# Patient Record
Sex: Male | Born: 1955 | Race: Black or African American | Hispanic: No | Marital: Single | Smoking: Never smoker
Health system: Southern US, Community
[De-identification: ages and names within clinical notes are randomized; demographics above are authoritative.]

## PROBLEM LIST (undated history)

## (undated) DIAGNOSIS — N2581 Secondary hyperparathyroidism of renal origin: Secondary | ICD-10-CM

## (undated) DIAGNOSIS — I10 Essential (primary) hypertension: Secondary | ICD-10-CM

## (undated) DIAGNOSIS — K219 Gastro-esophageal reflux disease without esophagitis: Secondary | ICD-10-CM

## (undated) DIAGNOSIS — N186 End stage renal disease: Secondary | ICD-10-CM

## (undated) DIAGNOSIS — E785 Hyperlipidemia, unspecified: Secondary | ICD-10-CM

## (undated) DIAGNOSIS — E119 Type 2 diabetes mellitus without complications: Secondary | ICD-10-CM

## (undated) DIAGNOSIS — N289 Disorder of kidney and ureter, unspecified: Secondary | ICD-10-CM

---

## 2003-12-27 ENCOUNTER — Other Ambulatory Visit: Payer: Self-pay

## 2004-09-28 ENCOUNTER — Emergency Department: Payer: Self-pay | Admitting: Emergency Medicine

## 2004-10-05 ENCOUNTER — Emergency Department: Payer: Self-pay | Admitting: Emergency Medicine

## 2004-10-06 ENCOUNTER — Other Ambulatory Visit: Payer: Self-pay

## 2005-07-10 ENCOUNTER — Emergency Department: Payer: Self-pay | Admitting: Emergency Medicine

## 2007-12-19 ENCOUNTER — Ambulatory Visit: Payer: Self-pay | Admitting: Nephrology

## 2007-12-23 ENCOUNTER — Ambulatory Visit: Payer: Self-pay | Admitting: Nephrology

## 2007-12-31 ENCOUNTER — Ambulatory Visit: Payer: Self-pay | Admitting: Internal Medicine

## 2008-01-26 ENCOUNTER — Ambulatory Visit: Payer: Self-pay | Admitting: Nephrology

## 2008-02-25 ENCOUNTER — Ambulatory Visit: Payer: Self-pay | Admitting: Nephrology

## 2008-04-02 ENCOUNTER — Ambulatory Visit: Payer: Self-pay | Admitting: Nephrology

## 2008-11-28 ENCOUNTER — Emergency Department: Payer: Self-pay | Admitting: Emergency Medicine

## 2008-12-08 ENCOUNTER — Ambulatory Visit: Payer: Self-pay | Admitting: Nephrology

## 2008-12-10 ENCOUNTER — Ambulatory Visit: Payer: Self-pay | Admitting: Vascular Surgery

## 2009-01-10 ENCOUNTER — Ambulatory Visit: Payer: Self-pay | Admitting: Internal Medicine

## 2009-01-12 ENCOUNTER — Ambulatory Visit: Payer: Self-pay | Admitting: Internal Medicine

## 2009-01-14 ENCOUNTER — Ambulatory Visit: Payer: Self-pay | Admitting: Internal Medicine

## 2009-06-03 ENCOUNTER — Ambulatory Visit: Payer: Self-pay | Admitting: Vascular Surgery

## 2009-11-03 ENCOUNTER — Ambulatory Visit: Payer: Self-pay | Admitting: Vascular Surgery

## 2009-12-02 ENCOUNTER — Ambulatory Visit: Payer: Self-pay | Admitting: Vascular Surgery

## 2009-12-08 ENCOUNTER — Inpatient Hospital Stay: Payer: Self-pay | Admitting: Internal Medicine

## 2010-06-27 ENCOUNTER — Ambulatory Visit: Payer: Self-pay | Admitting: Vascular Surgery

## 2010-11-10 ENCOUNTER — Ambulatory Visit: Payer: Self-pay | Admitting: Vascular Surgery

## 2010-11-15 ENCOUNTER — Ambulatory Visit: Payer: Self-pay | Admitting: Vascular Surgery

## 2010-12-11 ENCOUNTER — Ambulatory Visit: Payer: Self-pay | Admitting: Vascular Surgery

## 2010-12-21 ENCOUNTER — Ambulatory Visit: Payer: Self-pay | Admitting: Vascular Surgery

## 2010-12-28 ENCOUNTER — Ambulatory Visit: Payer: Self-pay | Admitting: Vascular Surgery

## 2011-01-03 ENCOUNTER — Ambulatory Visit: Payer: Self-pay | Admitting: Vascular Surgery

## 2011-04-03 ENCOUNTER — Ambulatory Visit: Payer: Self-pay | Admitting: Internal Medicine

## 2011-06-09 ENCOUNTER — Inpatient Hospital Stay: Payer: Self-pay | Admitting: Internal Medicine

## 2011-06-19 ENCOUNTER — Ambulatory Visit: Payer: Self-pay | Admitting: Vascular Surgery

## 2011-07-02 ENCOUNTER — Emergency Department: Payer: Self-pay | Admitting: Emergency Medicine

## 2011-07-03 ENCOUNTER — Ambulatory Visit: Payer: Self-pay | Admitting: Vascular Surgery

## 2011-11-01 ENCOUNTER — Ambulatory Visit: Payer: Self-pay | Admitting: Vascular Surgery

## 2012-01-22 ENCOUNTER — Ambulatory Visit: Payer: Self-pay | Admitting: Vascular Surgery

## 2012-04-01 ENCOUNTER — Emergency Department: Payer: Self-pay | Admitting: Emergency Medicine

## 2012-05-23 ENCOUNTER — Ambulatory Visit: Payer: Self-pay | Admitting: Vascular Surgery

## 2012-06-16 ENCOUNTER — Emergency Department: Payer: Self-pay | Admitting: Emergency Medicine

## 2012-07-04 ENCOUNTER — Inpatient Hospital Stay: Payer: Self-pay | Admitting: Specialist

## 2012-07-04 LAB — CBC
HCT: 28.1 % — ABNORMAL LOW (ref 40.0–52.0)
HGB: 9.3 g/dL — ABNORMAL LOW (ref 13.0–18.0)
MCH: 30.8 pg (ref 26.0–34.0)
MCHC: 33.2 g/dL (ref 32.0–36.0)
Platelet: 252 10*3/uL (ref 150–440)

## 2012-07-04 LAB — COMPREHENSIVE METABOLIC PANEL
Albumin: 2.9 g/dL — ABNORMAL LOW (ref 3.4–5.0)
Anion Gap: 12 (ref 7–16)
Bilirubin,Total: 0.4 mg/dL (ref 0.2–1.0)
Calcium, Total: 9.5 mg/dL (ref 8.5–10.1)
Co2: 26 mmol/L (ref 21–32)
Creatinine: 9.28 mg/dL — ABNORMAL HIGH (ref 0.60–1.30)
EGFR (African American): 7 — ABNORMAL LOW
EGFR (Non-African Amer.): 6 — ABNORMAL LOW
Glucose: 151 mg/dL — ABNORMAL HIGH (ref 65–99)
SGOT(AST): 23 U/L (ref 15–37)
SGPT (ALT): 11 U/L — ABNORMAL LOW
Sodium: 137 mmol/L (ref 136–145)
Total Protein: 8 g/dL (ref 6.4–8.2)

## 2012-07-04 LAB — PHOSPHORUS: Phosphorus: 4.7 mg/dL (ref 2.5–4.9)

## 2012-07-04 LAB — APTT: Activated PTT: 44.5 secs — ABNORMAL HIGH (ref 23.6–35.9)

## 2012-07-04 LAB — TROPONIN I: Troponin-I: <0.02 ng/mL

## 2012-07-04 LAB — MAGNESIUM: Magnesium: 1.9 mg/dL

## 2012-07-05 LAB — CBC WITH DIFFERENTIAL/PLATELET
Basophil #: 0.1 10*3/uL (ref 0.0–0.1)
Eosinophil #: 0.1 10*3/uL (ref 0.0–0.7)
Eosinophil %: 1.2 %
HCT: 29.4 % — ABNORMAL LOW (ref 40.0–52.0)
Lymphocyte %: 14.9 %
MCV: 94 fL (ref 80–100)
Monocyte #: 1.1 x10 3/mm — ABNORMAL HIGH (ref 0.2–1.0)
Monocyte %: 9.1 %
Neutrophil %: 73.9 %
RBC: 3.15 10*6/uL — ABNORMAL LOW (ref 4.40–5.90)
RDW: 15.3 % — ABNORMAL HIGH (ref 11.5–14.5)
WBC: 12 10*3/uL — ABNORMAL HIGH (ref 3.8–10.6)

## 2012-07-05 LAB — BASIC METABOLIC PANEL
BUN: 40 mg/dL — ABNORMAL HIGH (ref 7–18)
Co2: 28 mmol/L (ref 21–32)
Creatinine: 11.66 mg/dL — ABNORMAL HIGH (ref 0.60–1.30)
EGFR (African American): 5 — ABNORMAL LOW
EGFR (Non-African Amer.): 4 — ABNORMAL LOW
Glucose: 129 mg/dL — ABNORMAL HIGH (ref 65–99)
Sodium: 137 mmol/L (ref 136–145)

## 2012-07-05 LAB — PHOSPHORUS: Phosphorus: 4.5 mg/dL (ref 2.5–4.9)

## 2012-07-05 LAB — CK TOTAL AND CKMB (NOT AT ARMC): CK, Total: 43 U/L (ref 35–232)

## 2012-11-07 IMAGING — XA IR VASCULAR PROCEDURE
10 of 11 series · 15 of 16 positions shown · non-contrast
Comparison: none

[Series 1: care upper arm · 2 of 2 slices shown (1 of 7)]
[im 1/2]
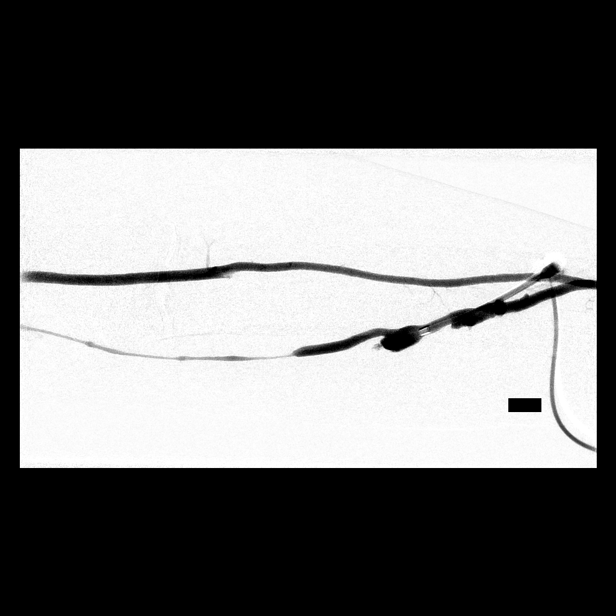
[im 2/2]
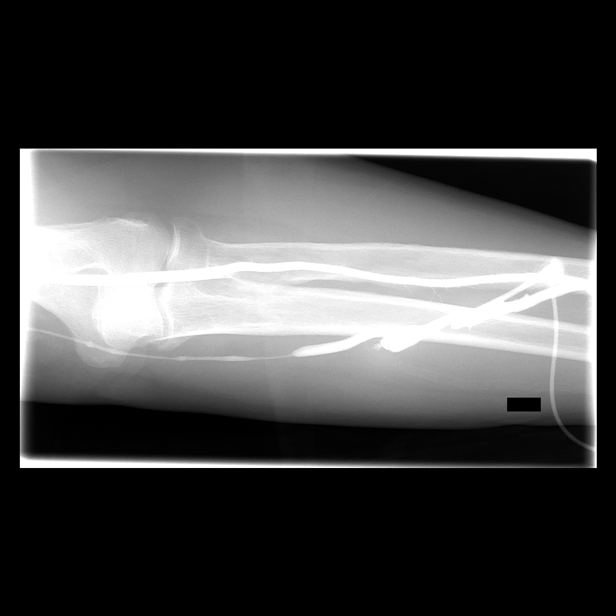

[Series 2: care upper arm · 2 of 2 slices shown (2 of 7)]
[im 1/2]
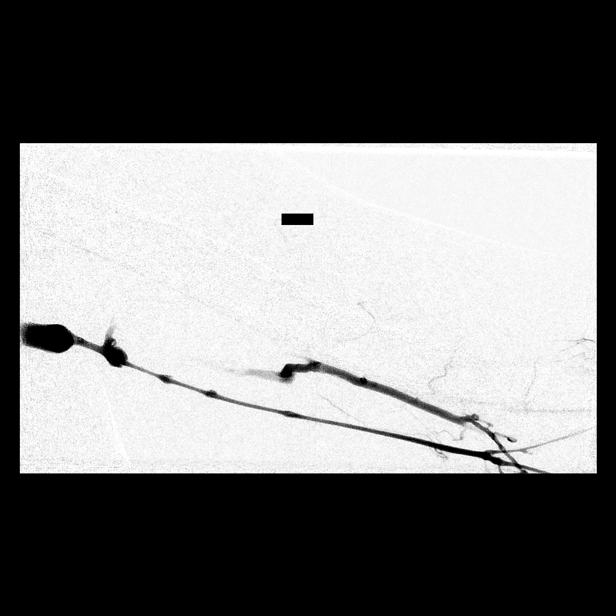
[im 2/2]
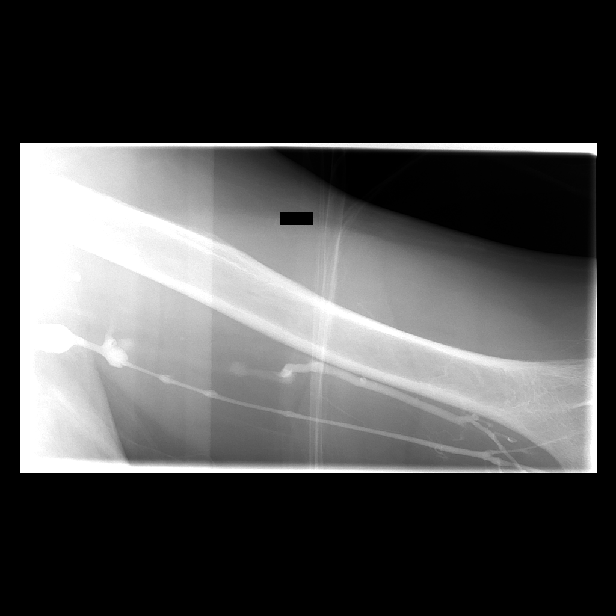

[Series 3: fl - angio · 1 of 1 slices shown (1 of 3)]
[im 1/1]
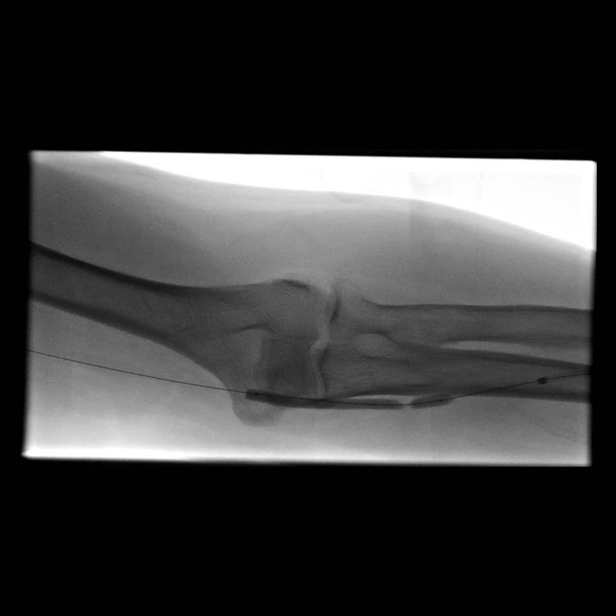

[Series 4: fl - angio · 1 of 1 slices shown (2 of 3)]
[im 1/1]
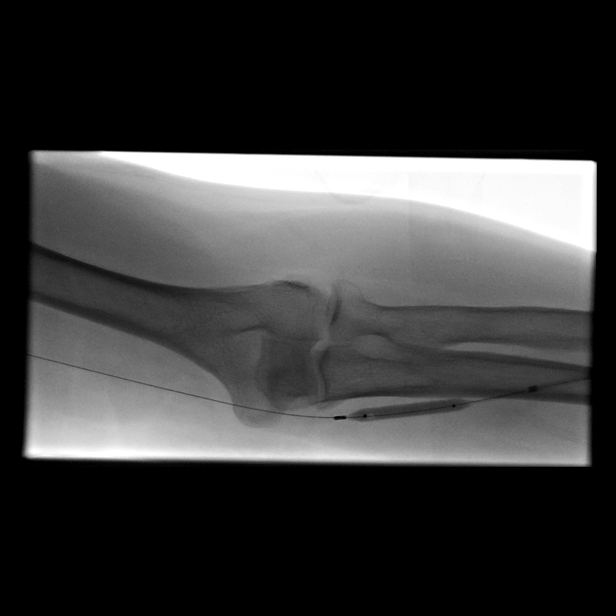

[Series 5: care upper arm · 1 of 1 slices shown (3 of 7)]
[im 1/1]
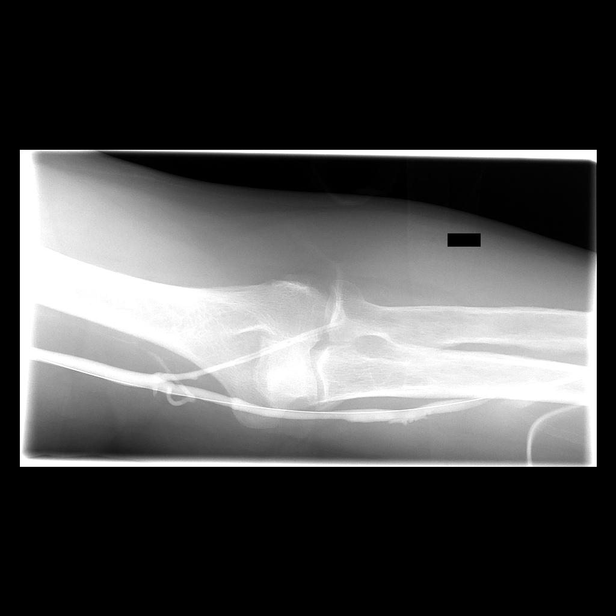

[Series 7: care upper arm · 1 of 1 slices shown (4 of 7)]
[im 1/1]
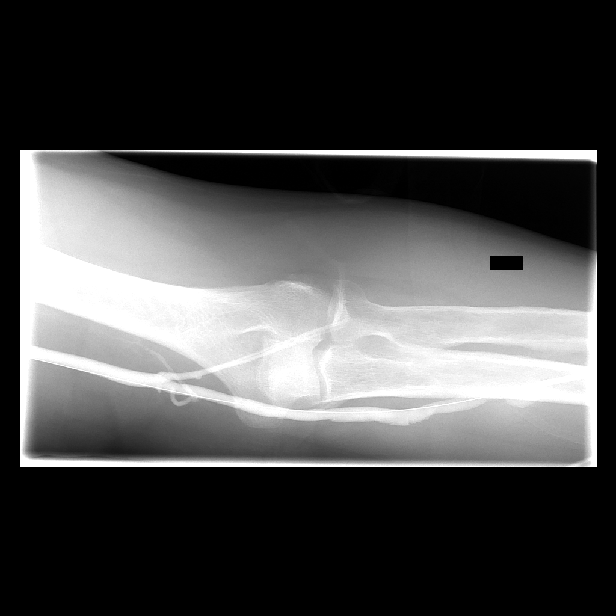

[Series 8: fl - angio · 2 of 2 slices shown (3 of 3)]
[im 1/2]
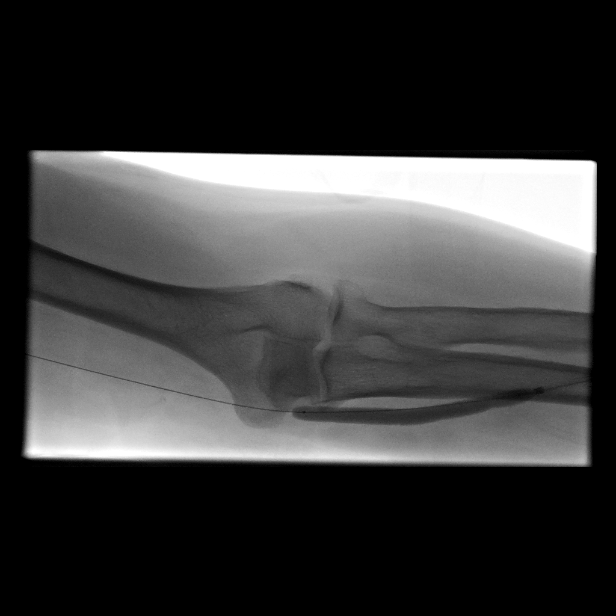
[im 2/2]
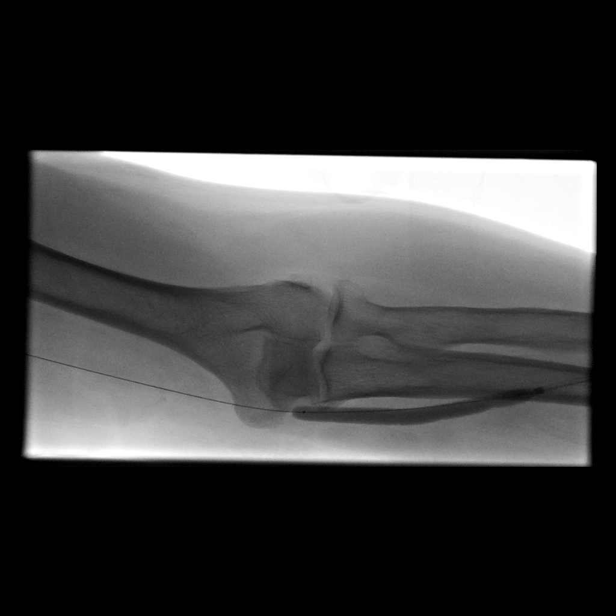

[Series 9: care upper arm · 1 of 1 slices shown (5 of 7)]
[im 1/1]
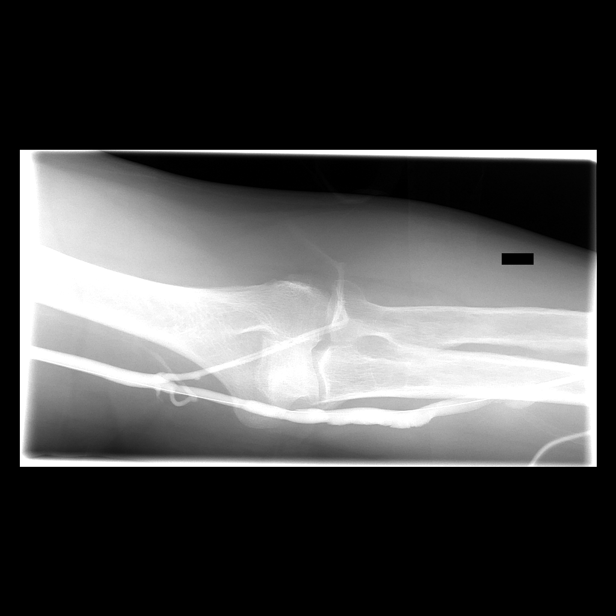

[Series 10: care upper arm · 2 of 2 slices shown (6 of 7)]
[im 1/2]
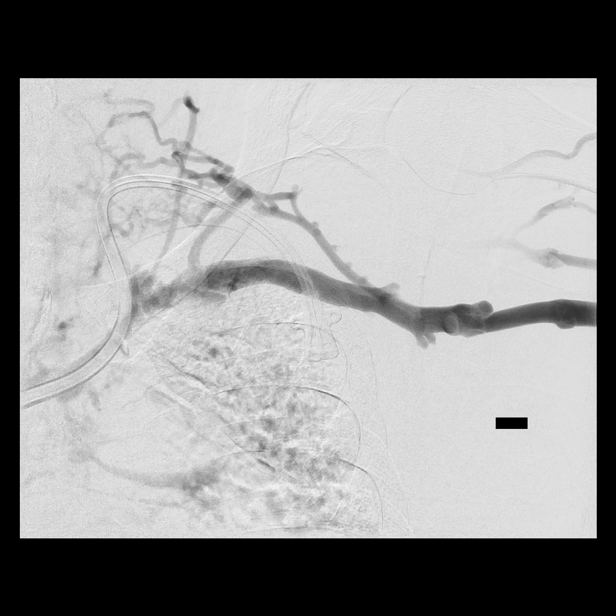
[im 2/2]
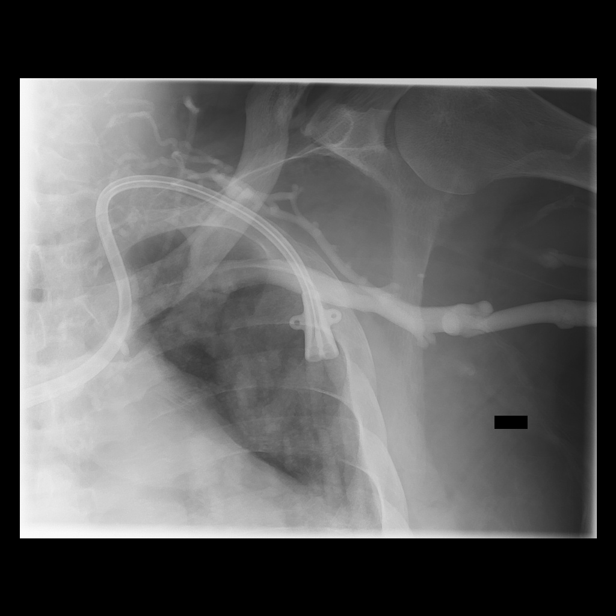

[Series 11: care upper arm · 2 of 2 slices shown (7 of 7)]
[im 1/2]
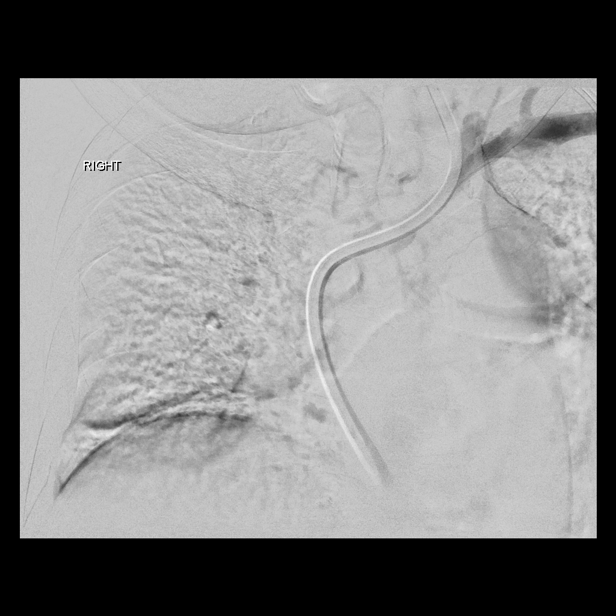
[im 2/2]
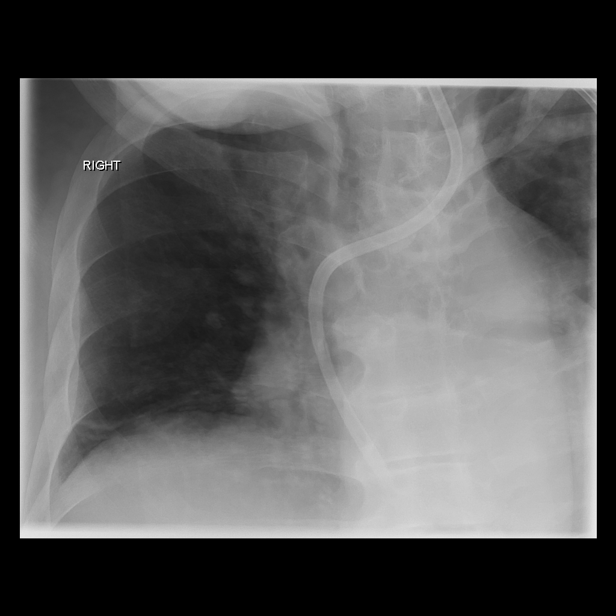

[15 of 16 positions shown; findings below may reference images not displayed]

IMAGES IMPORTED FROM THE [employer] WORKFLOW SYSTEM
NO DICTATION FOR STUDY

## 2013-04-23 ENCOUNTER — Ambulatory Visit: Payer: Self-pay | Admitting: Vascular Surgery

## 2013-04-23 LAB — BASIC METABOLIC PANEL
Anion Gap: 6 — ABNORMAL LOW (ref 7–16)
Calcium, Total: 10.3 mg/dL — ABNORMAL HIGH (ref 8.5–10.1)
Chloride: 102 mmol/L (ref 98–107)
Creatinine: 6.52 mg/dL — ABNORMAL HIGH (ref 0.60–1.30)
EGFR (African American): 10 — ABNORMAL LOW
EGFR (Non-African Amer.): 9 — ABNORMAL LOW
Glucose: 125 mg/dL — ABNORMAL HIGH (ref 65–99)
Potassium: 3.9 mmol/L (ref 3.5–5.1)

## 2013-10-29 ENCOUNTER — Ambulatory Visit: Payer: Self-pay | Admitting: Vascular Surgery

## 2013-10-29 LAB — PROTIME-INR
INR: 3.3
Prothrombin Time: 32.3 secs — ABNORMAL HIGH (ref 11.5–14.7)

## 2013-11-27 ENCOUNTER — Other Ambulatory Visit: Payer: Self-pay | Admitting: Internal Medicine

## 2013-11-27 LAB — PROTIME-INR
INR: 7.4
Prothrombin Time: 59.2 secs — ABNORMAL HIGH (ref 11.5–14.7)

## 2013-11-30 ENCOUNTER — Other Ambulatory Visit: Payer: Self-pay | Admitting: Internal Medicine

## 2013-11-30 LAB — PROTIME-INR
INR: 5.7
Prothrombin Time: 48.5 secs — ABNORMAL HIGH (ref 11.5–14.7)

## 2013-12-02 ENCOUNTER — Other Ambulatory Visit: Payer: Self-pay | Admitting: Internal Medicine

## 2014-04-09 ENCOUNTER — Ambulatory Visit: Payer: Self-pay

## 2014-04-23 ENCOUNTER — Ambulatory Visit: Payer: Self-pay

## 2014-07-23 ENCOUNTER — Other Ambulatory Visit: Payer: Self-pay | Admitting: Internal Medicine

## 2015-01-11 ENCOUNTER — Encounter: Payer: Self-pay | Admitting: Surgery

## 2015-01-24 ENCOUNTER — Encounter: Payer: Self-pay | Admitting: Surgery

## 2015-02-10 ENCOUNTER — Ambulatory Visit: Payer: Self-pay

## 2015-04-15 NOTE — Op Note (Signed)
PATIENT NAMRhetta [name]:  Dearden, Orestes MR#:  [id number] DATE OF BIRTH:  1956/07/17  DATE OF PROCEDURE:  10/29/2013  PREOPERATIVE DIAGNOSES:  1.  End-stage renal disease.  2.  Diminished flow, right arm arteriovenous fistula.  3.  Hypertension.   POSTOPERATIVE DIAGNOSES: 1.  End-stage renal disease.  2.  Diminished flow, right arm arteriovenous fistula.  3.  Hypertension.   PROCEDURE:  1.  Ultrasound guidance for vascular access to left forearm brachiobasilic arteriovenous fistula.  2.  Left upper extremity fistulogram and central venogram.  3.  Percutaneous transluminal angioplasty of perianastomotic stenosis with a 5 mm diameter angioplasty balloon.   SURGEON: Annice NeedyJason S Dew, M.D.   ANESTHESIA: Local with moderate conscious sedation.   ESTIMATED BLOOD LOSS: Minimal.   INDICATION FOR PROCEDURE: This is a gentleman well known to us for his dialysis access needs. His fistula has had diminished flow and a noninvasive study recently showed increased stenosis at the perianastomotic region. This was treated about 6 months ago with good success. He has a known chronic central venous occlusion and we plan to treat the perianastomotic stenosis today and angiography. Risks, benefits were discussed. Informed consent was obtained.  DESCRIPTION OF THE PROCEDURE: The patient's left lower extremity was sterilely prepped and draped in a sterile surgical field was created. The fistula was accessed in a retrograde fashion in the mid forearm under ultrasound guidance without difficulty and a permanent image was recorded. A micropuncture wire and sheath were then placed and upsized to a 6-French sheath and gave the patient 3000 units of intravenous heparin. A Kumpe catheter was placed into the radial artery and imaging was performed. This showed, what appeared to be, a at least moderate to high-grade stenosis in the perianastomotic region. This was a little difficult to discern due to the overlay of the radial artery, which  appeared to be in the 70% to 75% range. The remainder of the fistula was patent until the known chronic central venous occlusion, which was well collateralized.  A Magic torque wire was then placed. I treated the lesion with a 5 mm diameter angioplasty balloon at the anastomosis. A waist was taken just in the cephalic vein just beyond the anastomosis that resolved at about 18 atmospheres. Completion angiogram following this showed significantly improved flow with, what appeared to be, a 30% or less residual stenosis, and I elected to terminate the procedure. The sheath was removed around a 4-0 Monocryl purse suture. Pressure was held. Sterile dressing was placed. The patient tolerated the procedure well and was taken to the recovery room in stable condition.    ____________________________ Annice NeedyJason S. Dew, MD jsd:aw D: 10/29/2013 14:51:43 ET T: 10/29/2013 15:05:01 ET JOB#: [id number]  cc: Annice NeedyJason S. Dew, MD, <Dictator> Annice NeedyJASON S DEW MD ELECTRONICALLY SIGNED 11/05/2013 14:45

## 2015-04-15 NOTE — Op Note (Signed)
PATIENT NAMRhetta Mura:  [name], [name] MR#:  [id number] DATE OF BIRTH:  February 02, 1956  DATE OF PROCEDURE:  04/23/2013  PREOPERATIVE DIAGNOSES:  1. End-stage renal disease.  2. Decreased flow, left arm arteriovenous fistula.  3. Hypertension.   POSTOPERATIVE DIAGNOSES:  1. End-stage renal disease.  2. Decreased flow, left arm arteriovenous fistula.  3. Hypertension.   PROCEDURE: 1. Ultrasound guidance for vascular access of the left arm arteriovenous fistula.  2. Left upper extremity fistulogram and central venogram.  3. Percutaneous transluminal angioplasty of perianastomotic stenosis with 5 mm diameter angioplasty balloon.   SURGEON: Annice NeedyJason S. Dew, MD  ANESTHESIA: Local with moderate conscious sedation.   ESTIMATED BLOOD LOSS: 25 mL.   CONTRAST USED: 55 mL Visipaque.   FLUOROSCOPY TIME: About 3 minutes.   INDICATION FOR PROCEDURE: This is a 59 year old African-American male with end-stage renal disease. He has had decreased flows on his access, and we are asked to evaluate this. Fistulogram was to be performed.   DESCRIPTION OF PROCEDURE: The patient is brought to the vascular and interventional radiology suite. Left upper extremity was sterilely prepped and draped, and a sterile surgical field was created. Initially accessed the fistula about 5 cm beyond the anastomosis in an antegrade fashion with a micropuncture needle, and micropuncture wire and sheath were placed. Permanent image was recorded. Imaging performed through the micropuncture sheath showed the basilic vein to have good flow through most of the forearm in the upper arm and then drained into the left subclavian and innominate vein. 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 I then compressed the fistula to evaluate the arterial anastomotic area. This demonstrated an area of stenosis just beyond the anastomosis. It was a little difficult to discern, and a couple of angles  had to be used as this had some redundancy in the area as well. The stenosis did appear real, however, on magnified imaging, so I removed this sheath and put a pursestring suture on the site. A retrograde access was then obtained in the proximal forearm. Under ultrasound guidance, the micropuncture needle and micropuncture wire and sheath were placed and upsized to a 6 [place] sheath. The lesion was crossed without difficulty with a Magic Torque wire and a Kumpe catheter. A 5 mm diameter angioplasty balloon was inflated at the fistula anastomosis. A waist was taken, which resolved with angioplasty. The Kumpe catheter was replaced for imaging, which showed improvement with less than 30% residual stenosis, and I elected to terminate the procedure. The sheath was removed around a 4-0 Monocryl pursestring suture as well. Pressure was held. Sterile dressing was placed. The patient tolerated the procedure well and was taken to the recovery room in stable condition.     ____________________________ Annice NeedyJason S. Dew, MD jsd:OSi D: 04/23/2013 09:01:21 ET T: 04/23/2013 09:35:03 ET JOB#: [id number]  cc: Annice NeedyJason S. Dew, MD, <Dictator> Annice NeedyJASON S DEW MD ELECTRONICALLY SIGNED 04/30/2013 15:51

## 2015-04-17 NOTE — Op Note (Signed)
PATIENT NAMEMACEDONIO, [name] MR#:  [id number] DATE OF BIRTH:  January 07, 1956  DATE OF PROCEDURE:  01/22/2012  PREOPERATIVE DIAGNOSES:  1. Complication of AV dialysis device with difficulty with dialysis access.  2. End-stage renal disease requiring hemodialysis.   POSTOPERATIVE DIAGNOSES:  1. Complication of AV dialysis device with difficulty with dialysis access.  2. End-stage renal disease requiring hemodialysis.   PROCEDURES PERFORMED:  1. Contrast injection, left radial basilic fistula.  2. Percutaneous transluminal angioplasty 4 mm arterial anastomosis left arm radial basilic fistula.   SURGEON: Katha Cabal, MD  SEDATION: Patient underwent monitored conscious sedation for approximately 45 minutes.   ACCESS:  1. 5 French micro sheath in an antegrade direction.  2. 6 French sheath, retrograde direction, left radial basilic fistula.   CONTRAST USED: Isovue 20 mL.   FLUOROSCOPY TIME: 1.1 minutes.   INDICATIONS: [name] is a 59 year old gentleman who presented with increasing problems with dialysis runs and poor flows. Physical examination as well as noninvasive studies demonstrated a stricture near the arterial anastomosis. He is now undergoing evaluation with angiography and the hope of intervention. Risks and benefits were reviewed. All questions are answered. Patient agrees to proceed.   DESCRIPTION OF PROCEDURE: Patient is taken to special procedures, placed in supine position. After adequate Precedex sedation had been achieved, he is positioned with his left arm extended palm upward. Left arm is prepped and draped in sterile fashion. 1% lidocaine is infiltrated into soft tissues and access to the fistula is obtained with ultrasound guidance so that the arterial anastomosis is not disrupted. Access is obtained with a micropuncture kit in the antegrade direction. Ultrasound is placed in a sterile sleeve. Fistula is identified. It is echolucent and compressible, indicating patency.  Image is recorded and the puncture is made under real-time visualization. Microwire followed by micro sheath is inserted. Stopcock is then applied to the micro sheath and hand injection of contrast is utilized to demonstrate the fistula. After review of these images, reflux images obtained with compression of the fistula in the mid forearm, a high-grade stricture is noted at the arterial anastomosis.   3000 units of heparin is given. Micropuncture needle is used to access the fistula in a retrograde direction and a microwire followed by micro sheath, Magic torque wire followed by 6 [place] sheath is inserted. 4 mm x 4 cm balloon is then advanced across the stricture and inflated to 12 atmospheres for one minute. Follow-up angiography and two varying oblique views demonstrates resolution of the stricture with improved flow through the fistula.   Pursestring sutures of 4-0 Monocryl are placed around each of the sheaths. The sheaths are removed. Light pressure is held. There are no immediate complications.   INTERPRETATION: Initial views of the fistula in an antegrade direction demonstrate the cannulation sites are widely patent and only mildly aneurysmal. Previously noted stricture stenosis at the elbow appears widely patent and free of recurrence. Veins of the upper arm are widely patent as is the axillary and subclavian veins. 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 There does not appear to be any disruption of flow at this time. Refluxed image with compression of the fistula in the mid forearm demonstrates a greater than 80% stricture at the arterial anastomosis.   Following angioplasty of 4 mm multiple oblique views are obtained which demonstrate resolution of this stricture.   SUMMARY: Successful treatment of left radial basilic fistula.   ____________________________ Belenda Cruise  Eloise Levels, MD ggs:cms D: 01/22/2012 12:41:30  ET T: 01/22/2012 12:59:44 ET JOB#: [id number] cc: Katha Cabal, MD, <Dictator>  Katha Cabal MD ELECTRONICALLY SIGNED 02/06/2012 11:24

## 2015-04-17 NOTE — Op Note (Signed)
PATIENT NAMRhetta Mura:  [name], [name] MR#:  [id number] DATE OF BIRTH:  1956-12-07  DATE OF PROCEDURE:  05/23/2012  PREOPERATIVE DIAGNOSES:  1. Complication of AV dialysis device.  2. End-stage renal disease requiring hemodialysis.   POSTOPERATIVE DIAGNOSES:  1. Complication of AV dialysis device.  2. End-stage renal disease requiring hemodialysis.   PROCEDURES PERFORMED:  1. Contrast injection left basilic transposition.  2. Percutaneous transluminal angioplasty at the venous portion basilic transposition.   SURGEON: Renford DillsGregory G. Schnier, MD  SEDATION: Versed 3 mg plus fentanyl 150 mcg administered IV. Continuous ECG, pulse oximetry and cardiopulmonary monitoring is performed throughout the entire procedure by the interventional radiology nurse. Total sedation time is 45 minutes.   ACCESS: 6 French sheath, antegrade direction, left forearm basilic transposition.   CONTRAST USED: Isovue 25 mL.   FLUOROSCOPY TIME: 1.0 minutes.   INDICATIONS: [name] has been noting decreasing efficiency of his dialysis with increasing recirculation and prolonged bleeding following decannulation. Physical examination as well as duplex ultrasound suggests stenosis near the level of the elbow and he is now undergoing angiography with the potential for intervention. Risks and benefits were reviewed. All questions are answered. Patient has agreed to proceed.   DESCRIPTION OF PROCEDURE: Patient is taken to special procedures, placed in supine position. After adequate sedation is achieved, his left arm is extended palm upward and prepped and draped in sterile fashion. 1% lidocaine is infiltrated into the soft tissues overlying the fistula, which is noted to be markedly pulsatile and a micropuncture needle is inserted without difficulty. Microwire followed by micro sheath, J-wire followed by a 6 French sheath in an antegrade direction. Hand injection of contrast is then used to demonstrate the fistula as well as the more  proximal venous anatomy and the central venous anatomy. 3000 units of heparin is given. A Magic torque wire is then advanced through the sheath across the lesion and initially a 6 x 4 balloon and subsequently an 8 x 4 balloon is used to dilate this stenotic area. Both inflations are to 12 to 14 atmospheres for approximately one minute. Follow-up angiography demonstrates resolution of the stricture with rapid flow of contrast by palpation. There is a significant reduction in pulsations and a marked improvement in the thrill. Pursestring suture of 4-0 Monocryl was placed around the sheath. Sheath is pulled, light pressure is held and there are no immediate complications.   INTERPRETATION: Initial views of the fistula demonstrate the fistula is widely patent. There is a greater than 90% narrowing of the basilic outflow vein just proximal to the venous cannulation site. The basilic and brachial veins are widely patent in the upper arm, axillary and subclavian veins are patent. There is previously documented occlusion of the innominate. There is extensive collaterals noted.   Following angioplasty as described above, there is no residual stenosis.   SUMMARY: Successful salvage of the left forearm graft.  ____________________________ Renford DillsGregory G. Schnier, MD ggs:cms D: 05/23/2012 15:58:55 ET T: 05/23/2012 16:15:57 ET JOB#: [id number]  cc: Renford DillsGregory G. Schnier, MD, <Dictator> Mosetta PigeonHarmeet Singh, MD Renford DillsGREGORY G SCHNIER MD ELECTRONICALLY SIGNED 06/03/2012 8:57

## 2015-04-17 NOTE — H&P (Signed)
PATIENT NAMECARLISLE, [name] [name] MR#:  [id number] DATE OF BIRTH:  [birth year]  DATE OF ADMISSION:  07/04/2012  PRIMARY CARE PHYSICIAN: Dr. Maryellen Pile   CHIEF COMPLAINT: Palpitations at dialysis.   HISTORY OF PRESENT ILLNESS: This is a 59 year old male who went to hemodialysis today and was noted to be in supraventricular tachycardia with heart rates in the 140s. His dialysis was canceled and he was sent over to the ER for further evaluation. The patient went to dialysis on Wednesday and his heart rate was fine and he tolerated dialysis well and was he was completely asymptomatic. This morning he felt a little bit lightheaded when he woke up out of bed. He drove himself to dialysis and was noted to be in supraventricular tachycardia. He did complain of some mild palpitations this morning but no chest pain, no shortness of breath. No nausea, no vomiting, no diaphoresis, no dizziness, and no other associated symptoms presently. The patient was brought to the ER and received 15 mg of IV metoprolol with some minimal improvement in his heart rate control. Hospitalist service was then contacted for further treatment and evaluation.   REVIEW OF SYSTEMS: CONSTITUTIONAL: No documented fever. No weight gain or weight loss. EYES: No blurred or double vision. ENT: No tinnitus. No postnasal drip. No redness of the oropharynx. RESPIRATORY: No cough, no wheeze, no hemoptysis. CARDIOVASCULAR: No chest pain, no orthopnea. Positive palpitations. No  syncope. GI: No nausea, no vomiting, no diarrhea, no abdominal pain, no melena, no hematochezia. GU: No dysuria or hematuria. ENDOCRINE: No polyuria or nocturia. No heat or cold intolerance. HEME: No anemia. No bruising. No bleeding. INTEGUMENT: No rashes. No lesions. MUSCULOSKELETAL: No arthritis, no swelling, no gout. NEUROLOGIC: No numbness, no tingling, no ataxia, no seizure type activity. PSYCH: No anxiety, no insomnia, no ADD.   PAST MEDICAL HISTORY:  1. Endstage renal disease on  hemodialysis Monday, Wednesday, Friday.  2. Diabetes.  3. Hypertension.  4. Hyperlipidemia. 5. Secondary hyperparathyroidism.   ALLERGIES: No known drug allergies.   SOCIAL HISTORY: No smoking. No alcohol abuse. No illicit drug abuse. Lives at home with his brother.   FAMILY HISTORY: The patient's mother died from a massive heart attack. Father died from lung cancer.   CURRENT MEDICATIONS:  1. Tylenol as needed.  2. Augmentin 1 tab b.i.d. for the next three days.  3. Coreg 12.5 mg b.i.d.  4. Clonidine as needed for diastolic pressure greater than 110 and systolic pressure greater than 200 on days of dialysis Monday, Wednesday, Friday. 5. Diphenhydramine 12.5 mg an hour prior to dialysis Monday, Wednesday, Friday. 6. Epogen 3 times a week on dialysis Monday, Wednesday, Friday. 7. Hectorol 2 mcg 3 times a week on dialysis days,. Monday, Wednesday, Friday.  8. Nephro-Vite vitamin 1 tab daily.  9. Nexium 40 mg daily.  10. Kaopectate 30 mL as needed for stomach irritation on days of dialysis Monday, Wednesday, and Friday.  11. NovoLog Flex pen 70/30, 40 units in the evening.  12. Zofran 4 mg q. 4-6 h. as needed.  13. Promethazine 12.5 mg also on the days of dialysis as needed.  14. Renvela 800 mg 4 tabs t.i.d. with meals.  15. Simvastatin 20 mg daily.  16. Torsemide 25 mg daily.  17. TUMS as needed.  18. Venofer 50 mg injectable weekly on Wednesdays at dialysis.   PHYSICAL EXAMINATION ON ADMISSION:  VITAL SIGNS: Temperature 97.8, pulse 121, respirations 20, blood pressure 106/77, sats 97% on room air.   GENERAL: He is a  pleasant appearing male in no apparent distress.   HEENT: Atraumatic, normocephalic. Extraocular muscles are intact. Pupils are equal and reactive to light. Sclerae anicteric. No conjunctival injection. No pharyngeal erythema.   NECK: Supple. No jugular venous distention. No bruits, no lymphadenopathy, no thyromegaly.   HEART: Regular rate and rhythm. Tachycardic.  No murmurs, no rubs, no clicks.   LUNGS: Clear to auscultation bilaterally. No rales, no rhonchi, no wheezes.   ABDOMEN: Soft, flat, nontender, nondistended. Has good bowel sounds. No hepatosplenomegaly appreciated.   EXTREMITIES: No evidence of any cyanosis, clubbing, or peripheral edema. Has +2 pedal and radial pulses bilaterally.   NEUROLOGICAL: The patient is alert, awake, and oriented times three with no focal motor or sensory deficits appreciated bilaterally.   SKIN: Moist and warm with no rash appreciated.   LYMPHATIC: There is no cervical or axillary lymphadenopathy.   The patient does have a left upper extremity fistula with a good bruit and good thrill and no drainage and no redness around it.   LABORATORY, DIAGNOSTIC, AND RADIOLOGICAL DATA: Glucose 151, BUN 24, creatinine 9.2, sodium 137, potassium 4.9, chloride 99, bicarbonate 26. LFTs are within normal limits. Troponin less than 0.02.   The patient did have a chest x-ray done which showed findings consistent with mild congestive heart failure and mild pulmonary interstitial edema. EKG shows supraventricular tachycardia, but in normal sinus rhythm.   ASSESSMENT AND PLAN: This is a 59 year old male with a history of endstage renal disease on hemodialysis, hypertension, diabetes, hyperlipidemia, and secondary hyperparathyroidism, and gastroesophageal reflux disease who presents to the hospital as he was noted to be in supraventricular tachycardia at dialysis.  1. Supraventricular tachycardia: The patient's heart rate was noted to be in the 140s at dialysis today. After receiving 15 mg of IV metoprolol his heart rate has not improved, although the patient is currently asymptomatic other than having some mild palpitations earlier. He is hemodynamically stable. He denies any chest pain. I will observe him overnight on telemetry. Follow serial cardiac markers, get a two-dimensional echocardiogram. We will get a cardiology consult. The  patient is followed by Dr. Juliann Paresallwood but I discussed the case with Dr. Gwen PoundsKowalski who was covering for Dr. Juliann Paresallwood. I will continue Coreg for rate control, also add some p.r.n. IV diltiazem and follow clinically.  2. Endstage renal disease on hemodialysis: The patient normally gets dialysis Monday, Wednesday, Friday. He missed his dialysis today due to tachycardia. I will go ahead and get a nephrology consult to see if he can get dialyzed tomorrow.  3. Diabetes: Continue to Novolin 70/30. Follow his blood sugars.  4. Hyperlipidemia: Continue simvastatin.  5. Gastroesophageal reflux disease: Continue Nexium.  6. Hypertension: Continue Coreg and torsemide.   CODE STATUS: The patient is a FULL CODE.    TIME SPENT: 50 minutes.   ____________________________ Rolly PancakeVivek J. Cherlynn KaiserSainani, MD vjs:bjt D: 07/04/2012 13:09:03 ET T: 07/04/2012 13:53:34 ET JOB#: [id number]  cc: Rolly PancakeVivek J. Cherlynn KaiserSainani, MD, <Dictator> Serita ShellerErnest B. Maryellen PileEason, MD [place] SirenVIVEK J SAINANI MD ELECTRONICALLY SIGNED 07/04/2012 15:47

## 2015-04-17 NOTE — Discharge Summary (Signed)
PATIENT NAMESALOME, [name] MR#:  [id number] DATE OF BIRTH:  [birth year]  DATE OF ADMISSION:  07/04/2012 DATE OF DISCHARGE:  07/06/2012  For a detailed note, please take a look at the history and physical on admission.   DIAGNOSES AT DISCHARGE:  1. Supraventricular tachycardia likely atrial tachycardia, now resolved.  2. End-stage renal disease on hemodialysis Monday, Wednesday, Friday.  3. Diabetes with one episode of hypoglycemia, now resolved.  4. Hypertension.  5. Secondary hyperparathyroidism.   DIET: Patient is being discharged on a low-sodium, American Diabetic Association low fat diet.   ACTIVITY: As tolerated.   FOLLOW UP: Follow up is with Dr. Maryellen Pile in the next 1 to 2 weeks.   DISCHARGE MEDICATIONS:  1. Hectorol 2 mcg injectable weekly on Monday, Wednesday, Friday. 2. Venofer 50 mg injectable weekly on Wednesdays during dialysis. 3. Diphenhydramine 12.5 mg daily on dialysis days Monday, Wednesday, Friday. 4. Tylenol as needed.  5. Clonidine 0.1 mg daily as needed for diastolic blood pressure greater than 110 and systolic blood pressure greater than 200. 6. Epogen 4400 units 3 times weekly on Monday, Wednesday, Friday.  7. Kaopectate 30 mL daily on dialysis days Monday, Wednesday, Friday.  8. Zofran 4 mg also on dialysis days Monday, Wednesday, Friday as needed.  9. TUMS 1 to 2 tabs daily as needed on dialysis days.  10. Aspirin 81 mg daily.  11. Renvela 800 mg 4 tabs t.i.d. with meals.  12. Nephro-Vite 1 tab daily.  13. Nexium 40 mg daily.  14. NovoLog 70/30, 40 units in the evening.  15. Simvastatin 20 mg daily.  16. Torsemide 2.5 mg daily.  17. Coreg 25 mg b.i.d. 18. Cardizem CD 120 mg daily.   CONSULTANT DURING THE HOSPITAL COURSE: Dr. Arnoldo Hooker from cardiology.   BRIEF HOSPITAL COURSE: This is a 59 year old male who was sent over from dialysis as he was noted to be in supraventricular tachycardia.  1. Supraventricular tachycardia. Patient presented to  dialysis this past Friday and was noted to have heart rates in the 140s and therefore was sent to the ER. He was noted to be in atrial tachycardia rhythm. Patient received some pulse doses of IV metoprolol without much improvement in his heart rate and therefore was admitted. He was started on long-acting Cardizem along with his beta blockers dose was increased. Upon adjustment of these medications patient's heart rate has now improved. His heart rate has come down to the low 70s to 80s. He is asymptomatic and presently hemodynamically stable and therefore is being discharged home on the regimen as mentioned.  2. End-stage renal disease on hemodialysis. Patient missed his dialysis on Friday therefore did get dialyzed yesterday here in the hospital. He will resume his dialysis on Monday, Wednesday, Friday.  3. Diabetes. Patient did have one episode of hypoglycemia the day after admission. He was started on D10 drip intermittently. Since then he has been restarted back on his insulin and has had no further episodes of hypoglycemia and is therefore being discharged.  4. Hypertension. As mentioned patient's Coreg dose was increased and Cardizem was added for rate control for his supraventricular tachycardia. He will be discharged on the same regimen along with his torsemide.  5. Hyperlipidemia. Patient was maintained on his simvastatin, he will resume that.  6. CODE STATUS: Patient is a FULL CODE.   TIME SPENT WITH DISCHARGE: 40 minutes.   ____________________________ Rolly Pancake. Cherlynn Kaiser, MD vjs:cms D: 07/06/2012 14:27:50 ET T: 07/06/2012 14:41:42 ET JOB#: [id number]  cc: Danella Deis  Jasper LoserJ. Tandi Hanko, MD, <Dictator> Serita ShellerErnest B. Maryellen PileEason, MD Houston SirenVIVEK J Thorne Wirz MD ELECTRONICALLY SIGNED 07/08/2012 12:13

## 2015-04-17 NOTE — Consult Note (Signed)
PATIENT NAME:  [name] [name], [name] [name] MR#:  [id number] DATE OF BIRTH:  [birth year]  DATE OF CONSULTATION:  07/04/2012  REFERRING PHYSICIAN:  Hilda Lias, MD CONSULTING PHYSICIAN:  Lamar Blinks, MD  REASON FOR CONSULTATION: Atrial tachycardia.   CHIEF COMPLAINT: "I feel short of breath."   HISTORY OF PRESENT ILLNESS: This is a 59 year old male with known chronic kidney disease on dialysis, hypertension, hyperlipidemia, diabetes mellitus, and previous myocardial infarction with congestive heart failure having recent dialysis at which the patient has had no evidence of significant heart failure or angina. The patient has had new onset of shortness of breath but no evidence of lower extremity edema. The patient did well with dialysis and had no further significant concerns until he was pulled off dialysis and had an EKG and telemetry showing atrial tachycardia with a heart rate of 140 beats per minute possibly consistent with atrial flutter, although no P waves were seen. The patient now has no significant symptoms whatsoever and is much more stable. EKG currently shows atrial tachycardia with nonspecific ST changes and no evidence of myocardial infarction. Troponin and CK-MB are within normal limits.   REVIEW OF SYSTEMS: Negative for vision change, ringing in the ears, hearing loss, weakness, fatigue, heartburn, nausea, vomiting, diarrhea, diaphoresis, abdominal, pain, frequent urination, urination at night, muscle weakness, numbness, anxiety, depression, skin lesions, skin rashes, syncope, or dizziness.   PAST MEDICAL HISTORY:  1. Chronic kidney disease with renal failure.  2. Hypertension.  3. Hyperlipidemia.  4. Heart failure with congestive heart failure and previous myocardial infarction.  5. Diabetes. 6. Tachycardia.   FAMILY HISTORY: Mother had early onset of cardiovascular disease and hypertension as well as myocardial infarction and death.   SOCIAL HISTORY: The patient currently denies  alcohol or tobacco use.   ALLERGIES: No known drug allergies.  CURRENT MEDICATIONS: As listed.   PHYSICAL EXAMINATION:   VITAL SIGNS: Blood pressure 126/68 bilaterally and heart rate 72 upright and reclining and regular.   GENERAL: He is a well appearing male in no acute distress.   HEENT: No icterus, thyromegaly, ulcers, hemorrhage, or xanthelasma.   HEART: Regular rate and rhythm. Normal S1 and soft S2 with a 2/6 apical murmur consistent with mitral regurgitation. Point of maximal impulse is diffuse. Carotid upstroke is normal without bruit. Jugular venous pressure is normal.   LUNGS: Few basilar crackles with normal respirations.   ABDOMEN: Soft and nontender without hepatosplenomegaly or masses. Abdominal aorta is normal size without bruit.   EXTREMITIES: 2+ bilateral pulses in dorsal, pedal, radial, and femoral arteries with 1+ lower extremity edema. No cyanosis, clubbing, or ulcers.   NEUROLOGIC: He is oriented to time, place, and person with normal mood and affect.   ASSESSMENT: A 59 year old male with hypertension, hyperlipidemia, diabetes mellitus, chronic kidney disease, coronary artery disease, and previous myocardial infarction with history of congestive heart failure having new onset atrial tachycardia without evidence of myocardial infarction needing further treatment options.   RECOMMENDATIONS:  1. Beta blocker and/or combination of low-dose long-acting calcium channel blocker for heart rate control and possible spontaneous conversion to normal sinus rhythm.  2. No anticoagulation at this time due to recent dialysis and no evidence at this time of atrial fibrillation.  3. Echocardiogram for LV systolic dysfunction and valvular heart disease contributing to above issues and further adjustments. 4. Continue surveillance of hyperlipidemia and treatment thereof as necessary with goal LDL below 70 mg/dL.  5. Continue diabetes control with goal hemoglobin A1c below 7.0.   6. Hypertension  control with above beta blocker, calcium channel blocker, and amlodipine as necessary.  7. Further ambulation and adjustments of medication dosages as necessary and possible electrical cardioversion to normal sinus rhythm if necessary if the patient has symptoms later. ____________________________ Lamar BlinksBruce J. Kowalski, MD bjk:slb D: 07/04/2012 13:33:49 ET T: 07/04/2012 14:12:23 ET JOB#: [id number]  cc: Lamar BlinksBruce J. Kowalski, MD, <Dictator> Lamar BlinksBRUCE J KOWALSKI MD ELECTRONICALLY SIGNED 07/14/2012 16:08

## 2015-07-04 ENCOUNTER — Other Ambulatory Visit
Admission: RE | Admit: 2015-07-04 | Discharge: 2015-07-04 | Disposition: A | Discharge status: Home / Self Care | Payer: Medicare Other | Source: Other Acute Inpatient Hospital | Attending: Internal Medicine | Admitting: Internal Medicine

## 2015-07-04 DIAGNOSIS — D688 Other specified coagulation defects: Secondary | ICD-10-CM | POA: Insufficient documentation

## 2015-07-04 LAB — PROTIME-INR
INR: 2.34
PROTHROMBIN TIME: 25.8 s — AB (ref 11.4–15.0)

## 2015-09-08 ENCOUNTER — Emergency Department: Payer: Medicare Other

## 2015-09-08 ENCOUNTER — Encounter: Payer: Self-pay | Admitting: Emergency Medicine

## 2015-09-08 ENCOUNTER — Emergency Department
Admission: EM | Admit: 2015-09-08 | Discharge: 2015-09-08 | Disposition: A | Discharge status: Home / Self Care | Payer: Medicare Other | Attending: Emergency Medicine | Admitting: Emergency Medicine

## 2015-09-08 DIAGNOSIS — I10 Essential (primary) hypertension: Secondary | ICD-10-CM | POA: Diagnosis not present

## 2015-09-08 DIAGNOSIS — Y9289 Other specified places as the place of occurrence of the external cause: Secondary | ICD-10-CM | POA: Diagnosis not present

## 2015-09-08 DIAGNOSIS — S8992XA Unspecified injury of left lower leg, initial encounter: Secondary | ICD-10-CM | POA: Insufficient documentation

## 2015-09-08 DIAGNOSIS — Y9389 Activity, other specified: Secondary | ICD-10-CM | POA: Diagnosis not present

## 2015-09-08 DIAGNOSIS — E119 Type 2 diabetes mellitus without complications: Secondary | ICD-10-CM | POA: Diagnosis not present

## 2015-09-08 DIAGNOSIS — Y998 Other external cause status: Secondary | ICD-10-CM | POA: Insufficient documentation

## 2015-09-08 DIAGNOSIS — M791 Myalgia, unspecified site: Secondary | ICD-10-CM

## 2015-09-08 DIAGNOSIS — X58XXXA Exposure to other specified factors, initial encounter: Secondary | ICD-10-CM | POA: Insufficient documentation

## 2015-09-08 HISTORY — DX: Type 2 diabetes mellitus without complications: E11.9

## 2015-09-08 HISTORY — DX: Hyperlipidemia, unspecified: E78.5

## 2015-09-08 HISTORY — DX: Disorder of kidney and ureter, unspecified: N28.9

## 2015-09-08 HISTORY — DX: Essential (primary) hypertension: I10

## 2015-09-08 MED ORDER — TRAMADOL HCL 50 MG PO TABS: 50.0000 mg | ORAL_TABLET | Freq: Four times a day (QID) | ORAL | Status: DC | PRN

## 2015-09-08 MED ORDER — OXYCODONE HCL 5 MG PO TABS
5.0000 mg | ORAL_TABLET | Freq: Once | ORAL | Status: AC
  Administered 2015-09-08: 5 mg via ORAL
  Filled 2015-09-08: qty 1

## 2015-09-08 NOTE — ED Provider Notes (Signed)
[facility] Emergency Department Provider Note ____________________________________________  Time seen: Approximately 10:21 AM  I have reviewed the triage vital signs and the nursing notes.   HISTORY  Chief Complaint Knee Pain   HPI [name] [name] is a 59 y.o. male who presents to the emergency department for evaluation of left posterior knee pain. He states that he got his foot caught underneath a buggy at [place] and somehow twisted his knee. He has a orthotic boot on that left ankle that extends to above mid calf. He has taken Tylenol without relief.   Past Medical History  Diagnosis Date  . Renal disorder   . Diabetes mellitus without complication   . Hypertension   . Hyperlipidemia     There are no active problems to display for this patient.   History reviewed. No pertinent past surgical history.  Current Outpatient Rx  Name  Route  Sig  Dispense  Refill  . traMADol (ULTRAM) 50 MG tablet   Oral   Take 1 tablet (50 mg total) by mouth every 6 (six) hours as needed.   9 tablet   0     Allergies Review of patient's allergies indicates no known allergies.  History reviewed. No pertinent family history.  Social History Social History  Substance Use Topics  . Smoking status: Never Smoker   . Smokeless tobacco: None  . Alcohol Use: No    Review of Systems Constitutional: No recent illness. Eyes: No visual changes. ENT: No sore throat. Cardiovascular: Denies chest pain or palpitations. Respiratory: Denies shortness of breath. Gastrointestinal: No abdominal pain.  Genitourinary: Negative for dysuria. Musculoskeletal: Pain in left posterior knee Skin: Negative for rash. Neurological: Negative for headaches, focal weakness or numbness. 10-point ROS otherwise negative.  ____________________________________________   PHYSICAL EXAM:  VITAL SIGNS: ED Triage Vitals  Enc Vitals Group     BP 09/08/15 0948 126/85 mmHg     Pulse Rate  09/08/15 0948 107     Resp 09/08/15 0948 20     Temp 09/08/15 0948 98.2 F (36.8 C)     Temp Source 09/08/15 0948 Oral     SpO2 09/08/15 0948 98 %     Weight 09/08/15 0948 235 lb (106.595 kg)     Height 09/08/15 0948 6' (1.829 m)     Head Cir --      Peak Flow --      Pain Score 09/08/15 0949 8     Pain Loc --      Pain Edu? --      Excl. in GC? --     Constitutional: Alert and oriented. Well appearing and in no acute distress. Eyes: Conjunctivae are normal. EOMI. Head: Atraumatic. Nose: No congestion/rhinnorhea. Neck: No stridor.  Respiratory: Normal respiratory effort.   Musculoskeletal: Normal inspection of the left posterior knee. Anterior knee appears swollen without erythema. Homans's sign is negative. Neurologic:  Normal speech and language. No gross focal neurologic deficits are appreciated. Speech is normal. No gait instability. Skin:  Skin is warm, dry and intact. Atraumatic. Psychiatric: Mood and affect are normal. Speech and behavior are normal.  ____________________________________________   LABS (all labs ordered are listed, but only abnormal results are displayed)  Labs Reviewed - No data to display ____________________________________________  RADIOLOGY  Ultrasound of the left lower extremity negative for DVT ____________________________________________   PROCEDURES  Procedure(s) performed: None   ____________________________________________   INITIAL IMPRESSION / ASSESSMENT AND PLAN / ED COURSE  Pertinent labs & imaging results that  were available during my care of the patient were reviewed by me and considered in my medical decision making (see chart for details).  Patient was given a single dose of Roxicet in the emergency department with significant relief of pain. He'll be sent home with a short prescription for tramadol. He is to follow-up with orthopedics for symptoms that are not improving over the week. He was advised to return to the  emergency department for shortness of breath or other symptoms that change or worsen. ____________________________________________   FINAL CLINICAL IMPRESSION(S) / ED DIAGNOSES  Final diagnoses:  Myalgia       Chinita Pester, FNP 09/08/15 1432  Jeanmarie Plant, MD 09/08/15 1500

## 2015-09-08 NOTE — ED Notes (Signed)
Pt states he injured his left knee last week and continues to have pain.the patient has boot on PTA

## 2015-11-21 ENCOUNTER — Other Ambulatory Visit: Payer: Self-pay | Admitting: Vascular Surgery

## 2015-11-29 ENCOUNTER — Encounter
Admission: RE | Disposition: A | Discharge status: Home / Self Care | Payer: Self-pay | Source: Ambulatory Visit | Attending: Vascular Surgery

## 2015-11-29 ENCOUNTER — Ambulatory Visit
Admission: RE | Admit: 2015-11-29 | Discharge: 2015-11-29 | Disposition: A | Discharge status: Home / Self Care | Payer: Medicare Other | Source: Ambulatory Visit | Attending: Vascular Surgery | Admitting: Vascular Surgery

## 2015-11-29 DIAGNOSIS — T82898A Other specified complication of vascular prosthetic devices, implants and grafts, initial encounter: Secondary | ICD-10-CM | POA: Diagnosis present

## 2015-11-29 DIAGNOSIS — E785 Hyperlipidemia, unspecified: Secondary | ICD-10-CM | POA: Insufficient documentation

## 2015-11-29 DIAGNOSIS — N186 End stage renal disease: Secondary | ICD-10-CM | POA: Insufficient documentation

## 2015-11-29 DIAGNOSIS — Z992 Dependence on renal dialysis: Secondary | ICD-10-CM | POA: Diagnosis not present

## 2015-11-29 DIAGNOSIS — Z794 Long term (current) use of insulin: Secondary | ICD-10-CM | POA: Insufficient documentation

## 2015-11-29 DIAGNOSIS — E1122 Type 2 diabetes mellitus with diabetic chronic kidney disease: Secondary | ICD-10-CM | POA: Insufficient documentation

## 2015-11-29 DIAGNOSIS — Z79899 Other long term (current) drug therapy: Secondary | ICD-10-CM | POA: Insufficient documentation

## 2015-11-29 DIAGNOSIS — I120 Hypertensive chronic kidney disease with stage 5 chronic kidney disease or end stage renal disease: Secondary | ICD-10-CM | POA: Insufficient documentation

## 2015-11-29 DIAGNOSIS — Y832 Surgical operation with anastomosis, bypass or graft as the cause of abnormal reaction of the patient, or of later complication, without mention of misadventure at the time of the procedure: Secondary | ICD-10-CM | POA: Diagnosis not present

## 2015-11-29 DIAGNOSIS — I2510 Atherosclerotic heart disease of native coronary artery without angina pectoris: Secondary | ICD-10-CM | POA: Diagnosis not present

## 2015-11-29 HISTORY — PX: PERIPHERAL VASCULAR CATHETERIZATION: SHX172C

## 2015-11-29 LAB — POTASSIUM (ARMC VASCULAR LAB ONLY): POTASSIUM (ARMC VASCULAR LAB): 4.4 (ref 3.5–5.1)

## 2015-11-29 LAB — GLUCOSE, CAPILLARY: GLUCOSE-CAPILLARY: 126 mg/dL — AB (ref 65–99)

## 2015-11-29 LAB — PROTIME-INR
INR: 2.93
Prothrombin Time: 30.1 seconds — ABNORMAL HIGH (ref 11.4–15.0)

## 2015-11-29 LAB — POTASSIUM: Potassium: 4.2 mmol/L (ref 3.5–5.1)

## 2015-11-29 SURGERY — A/V SHUNTOGRAM/FISTULAGRAM
Anesthesia: Moderate Sedation

## 2015-11-29 MED ORDER — DEXTROSE 5 % IV SOLN
1.5000 g | INTRAVENOUS | Status: AC
  Administered 2015-11-29: 1.5 g via INTRAVENOUS
  Filled 2015-11-29: qty 1.5

## 2015-11-29 MED ORDER — MIDAZOLAM HCL 5 MG/5ML IJ SOLN
INTRAMUSCULAR | Status: AC
  Filled 2015-11-29: qty 5

## 2015-11-29 MED ORDER — FENTANYL CITRATE (PF) 100 MCG/2ML IJ SOLN
INTRAMUSCULAR | Status: AC
  Filled 2015-11-29: qty 2

## 2015-11-29 MED ORDER — IOHEXOL 300 MG/ML  SOLN
INTRAMUSCULAR | Status: DC | PRN
  Administered 2015-11-29: 25 mL via INTRAVENOUS

## 2015-11-29 MED ORDER — SODIUM CHLORIDE 0.9 % IV SOLN
INTRAVENOUS | Status: DC
Start: 2015-11-29 — End: 2015-11-29
  Administered 2015-11-29: 15:00:00 via INTRAVENOUS

## 2015-11-29 MED ORDER — FENTANYL CITRATE (PF) 100 MCG/2ML IJ SOLN
INTRAMUSCULAR | Status: DC | PRN
  Administered 2015-11-29: 50 ug via INTRAVENOUS

## 2015-11-29 MED ORDER — LIDOCAINE HCL (PF) 1 % IJ SOLN
INTRAMUSCULAR | Status: AC
  Filled 2015-11-29: qty 30

## 2015-11-29 MED ORDER — HEPARIN SODIUM (PORCINE) 1000 UNIT/ML IJ SOLN
INTRAMUSCULAR | Status: AC
  Filled 2015-11-29: qty 1

## 2015-11-29 MED ORDER — INSULIN ASPART 100 UNIT/ML ~~LOC~~ SOLN: 0.0000 [IU] | SUBCUTANEOUS | Status: DC

## 2015-11-29 MED ORDER — MIDAZOLAM HCL 2 MG/2ML IJ SOLN
INTRAMUSCULAR | Status: DC | PRN
  Administered 2015-11-29: 1 mg via INTRAVENOUS

## 2015-11-29 MED ORDER — HEPARIN (PORCINE) IN NACL 2-0.9 UNIT/ML-% IJ SOLN
INTRAMUSCULAR | Status: AC
  Filled 2015-11-29: qty 1000

## 2015-11-29 SURGICAL SUPPLY — 5 items
DRAPE BRACHIAL (DRAPES) ×3 IMPLANT
PACK ANGIOGRAPHY (CUSTOM PROCEDURE TRAY) ×3 IMPLANT
SET INTRO CAPELLA COAXIAL (SET/KITS/TRAYS/PACK) ×3 IMPLANT
SHEATH BRITE TIP 6FRX5.5 (SHEATH) ×6 IMPLANT
TOWEL OR 17X26 4PK STRL BLUE (TOWEL DISPOSABLE) ×6 IMPLANT

## 2015-11-29 NOTE — H&P (Signed)
[facility] Admission History & Physical  MRN : [id number]  [name] [name] is a 59 y.o. (01/08/1956) male who presents with chief complaint of my dialysis access isn't working right.  History of Present Illness: Patient is sent by the dialysis center secondary to difficulty with cannulation as well as worsening of his dialysis parameters. This has been an issue relating to the past several weeks. Fairly abrupt in onset as his access was working well a month or so ago. He denies fever or chills. He denies hand pain. There are no alleviating factors is unrelated to position.  Current Facility-Administered Medications  Medication Dose Route Frequency Provider Last Rate Last Dose  . 0.9 %  sodium chloride infusion   Intravenous Continuous Kimberly A Stegmayer, PA-C 10 mL/hr at 11/29/15 1443    . cefUROXime (ZINACEF) 1.5 g in dextrose 5 % 50 mL IVPB  1.5 g Intravenous 30 min Pre-Op Kimberly A Stegmayer, PA-C   1.5 g at 11/29/15 1617  . insulin aspart (novoLOG) injection 0-24 Units  0-24 Units Subcutaneous 6 times per day Tonette LedererKimberly A Stegmayer, PA-C        Past Medical History  Diagnosis Date  . Renal disorder   . Diabetes mellitus without complication (HCC)   . Hypertension   . Hyperlipidemia     History reviewed. No pertinent past surgical history.  Social History Social History  Substance Use Topics  . Smoking status: Never Smoker   . Smokeless tobacco: None  . Alcohol Use: No    Family History History reviewed. No pertinent family history. no history of porphyria, autoimmune disease or bleeding clotting disorders  No Known Allergies   REVIEW OF SYSTEMS (Negative unless checked)  Constitutional: [] Weight loss  [] Fever  [] Chills Cardiac: [] Chest pain   [] Chest pressure   [] Palpitations   [] Shortness of breath when laying flat   [] Shortness of breath at rest   [] Shortness of breath with exertion. Vascular:  [] Pain in legs with walking   [] Pain in legs at  rest   [] Pain in legs when laying flat   [] Claudication   [] Pain in feet when walking  [] Pain in feet at rest  [] Pain in feet when laying flat   [] History of DVT   [] Phlebitis   [] Swelling in legs   [] Varicose veins   [] Non-healing ulcers Pulmonary:   [] Uses home oxygen   [] Productive cough   [] Hemoptysis   [] Wheeze  [] COPD   [] Asthma Neurologic:  [] Dizziness  [] Blackouts   [] Seizures   [] History of stroke   [] History of TIA  [] Aphasia   [] Temporary blindness   [] Dysphagia   [] Weakness or numbness in arms   [] Weakness or numbness in legs Musculoskeletal:  [] Arthritis   [] Joint swelling   [] Joint pain   [] Low back pain Hematologic:  [] Easy bruising  [] Easy bleeding   [] Hypercoagulable state   [] Anemic  [] Hepatitis Gastrointestinal:  [] Blood in stool   [] Vomiting blood  [] Gastroesophageal reflux/heartburn   [] Difficulty swallowing. Genitourinary:  [] Chronic kidney disease   [] Difficult urination  [] Frequent urination  [] Burning with urination   [] Blood in urine Skin:  [] Rashes   [] Ulcers   [] Wounds Psychological:  [] History of anxiety   []  History of major depression.  Physical Examination  Filed Vitals:   11/29/15 1418  BP: 127/91  Pulse: 105  Temp: 97.7 F (36.5 C)  TempSrc: Oral  Resp: 19  Height: 6' (1.829 m)  Weight: 106.595 kg (235 lb)  SpO2: 99%   Body mass index  is 31.86 kg/(m^2). Gen: WD/WN, NAD Head: [place]/AT, No temporalis wasting.  Ear/Nose/Throat: Hearing grossly intact, nares w/o erythema or drainage, oropharynx w/o Erythema/Exudate, Eyes: PERRLA, EOMI.  Neck: Supple, no nuchal rigidity.  No bruit or JVD.  Pulmonary:  Good air movement, clear to auscultation bilaterally, no increased work of respiration or use of accessory muscles  Cardiac: RRR, normal S1, S2, no Murmurs, rubs or gallops. 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 No guarding/reflex. No masses,  surgical incisions, or scars. Musculoskeletal: M/S 5/5 throughout.  No deformity or atrophy. Neurologic: CN 2-12 intact. Pain and light touch intact in extremities.  Symmetrical.  Speech is fluent. Motor exam as listed above. Psychiatric: Judgment intact, Mood & affect appropriate for pt's clinical situation. Dermatologic: No rashes or ulcers noted.  No cellulitis or open wounds. Lymph : No Cervical, Axillary, or Inguinal lymphadenopathy.   CBC Lab Results  Component Value Date   WBC 12.0* 07/05/2012   HGB 9.3* 07/05/2012   HCT 29.4* 07/05/2012   MCV 94 07/05/2012   PLT See comment 07/05/2012    BMET    Component Value Date/Time   NA 138 04/23/2013 0656   K 4.2 11/29/2015 1433   K 3.7 10/29/2013 1059   CL 102 04/23/2013 0656   CO2 30 04/23/2013 0656   GLUCOSE 125* 04/23/2013 0656   BUN 17 04/23/2013 0656   CREATININE 6.52* 04/23/2013 0656   CALCIUM 10.3* 04/23/2013 0656   GFRNONAA 9* 04/23/2013 0656   GFRAA 10* 04/23/2013 0656   CrCl cannot be calculated (Patient has no serum creatinine result on file.).  COAG Lab Results  Component Value Date   INR 2.93 11/29/2015   INR 2.34 07/04/2015   INR 4.1* 12/02/2013    Radiology No results found.  Assessment/Plan 1.  Complication dialysis device with poor AV access:  Patient's left forearm basilic transposition dialysis access is working poorly. The patient will undergo angiography and salvage using interventional techniques. Potassium will be drawn to ensure that it is an appropriate level prior to performing thrombectomy. 2.  End-stage renal disease requiring hemodialysis:  Patient will continue dialysis therapy without further interruption if a successful intervention is not achieved then catheter will be placed. Dialysis has already been arranged since the patient missed their previous session 3.  Hypertension:  Patient will continue medical management; nephrology is following no changes in oral medications. 4. Diabetes  mellitus:  Glucose will be monitored and oral medications been held this morning once the patient has undergone the patient's procedure po intake will be reinitiated and again Accu-Cheks will be used to assess the blood glucose level and treat as needed. The patient will be restarted on the patient's usual hypoglycemic regime 5.  Coronary artery disease:  EKG will be monitored. Nitrates will be used if needed. The patient's oral cardiac medications will be continued.     Schnier, Latina Craver, MD  11/29/2015 4:28 PM

## 2015-11-29 NOTE — Discharge Instructions (Signed)

## 2015-11-30 ENCOUNTER — Encounter: Payer: Self-pay | Admitting: Vascular Surgery

## 2016-01-04 ENCOUNTER — Other Ambulatory Visit
Admission: RE | Admit: 2016-01-04 | Discharge: 2016-01-04 | Disposition: A | Discharge status: Home / Self Care | Payer: Medicare Other | Source: Ambulatory Visit | Attending: Internal Medicine | Admitting: Internal Medicine

## 2016-01-04 ENCOUNTER — Encounter: Payer: Self-pay | Admitting: Emergency Medicine

## 2016-01-04 ENCOUNTER — Inpatient Hospital Stay
Admission: EM | Admit: 2016-01-04 | Discharge: 2016-01-10 | DRG: 377 | Disposition: A | Discharge status: Home / Self Care | Payer: Medicare Other | Attending: Specialist | Admitting: Specialist

## 2016-01-04 ENCOUNTER — Other Ambulatory Visit: Payer: Self-pay

## 2016-01-04 DIAGNOSIS — Z801 Family history of malignant neoplasm of trachea, bronchus and lung: Secondary | ICD-10-CM | POA: Diagnosis not present

## 2016-01-04 DIAGNOSIS — N186 End stage renal disease: Secondary | ICD-10-CM | POA: Diagnosis present

## 2016-01-04 DIAGNOSIS — I4891 Unspecified atrial fibrillation: Secondary | ICD-10-CM | POA: Insufficient documentation

## 2016-01-04 DIAGNOSIS — I429 Cardiomyopathy, unspecified: Secondary | ICD-10-CM | POA: Diagnosis present

## 2016-01-04 DIAGNOSIS — I120 Hypertensive chronic kidney disease with stage 5 chronic kidney disease or end stage renal disease: Secondary | ICD-10-CM | POA: Diagnosis present

## 2016-01-04 DIAGNOSIS — I9589 Other hypotension: Secondary | ICD-10-CM | POA: Diagnosis present

## 2016-01-04 DIAGNOSIS — Z794 Long term (current) use of insulin: Secondary | ICD-10-CM | POA: Diagnosis not present

## 2016-01-04 DIAGNOSIS — D631 Anemia in chronic kidney disease: Secondary | ICD-10-CM | POA: Insufficient documentation

## 2016-01-04 DIAGNOSIS — K922 Gastrointestinal hemorrhage, unspecified: Secondary | ICD-10-CM | POA: Diagnosis present

## 2016-01-04 DIAGNOSIS — T45515A Adverse effect of anticoagulants, initial encounter: Secondary | ICD-10-CM | POA: Diagnosis present

## 2016-01-04 DIAGNOSIS — D62 Acute posthemorrhagic anemia: Secondary | ICD-10-CM | POA: Diagnosis present

## 2016-01-04 DIAGNOSIS — Y92009 Unspecified place in unspecified non-institutional (private) residence as the place of occurrence of the external cause: Secondary | ICD-10-CM | POA: Diagnosis not present

## 2016-01-04 DIAGNOSIS — Z7982 Long term (current) use of aspirin: Secondary | ICD-10-CM | POA: Diagnosis not present

## 2016-01-04 DIAGNOSIS — R0902 Hypoxemia: Secondary | ICD-10-CM | POA: Diagnosis present

## 2016-01-04 DIAGNOSIS — D6832 Hemorrhagic disorder due to extrinsic circulating anticoagulants: Secondary | ICD-10-CM | POA: Diagnosis present

## 2016-01-04 DIAGNOSIS — Z7901 Long term (current) use of anticoagulants: Secondary | ICD-10-CM

## 2016-01-04 DIAGNOSIS — K219 Gastro-esophageal reflux disease without esophagitis: Secondary | ICD-10-CM | POA: Diagnosis present

## 2016-01-04 DIAGNOSIS — E1122 Type 2 diabetes mellitus with diabetic chronic kidney disease: Secondary | ICD-10-CM | POA: Diagnosis present

## 2016-01-04 DIAGNOSIS — I471 Supraventricular tachycardia: Secondary | ICD-10-CM | POA: Diagnosis present

## 2016-01-04 DIAGNOSIS — R000 Tachycardia, unspecified: Secondary | ICD-10-CM | POA: Diagnosis not present

## 2016-01-04 DIAGNOSIS — E785 Hyperlipidemia, unspecified: Secondary | ICD-10-CM | POA: Diagnosis present

## 2016-01-04 DIAGNOSIS — Z8249 Family history of ischemic heart disease and other diseases of the circulatory system: Secondary | ICD-10-CM

## 2016-01-04 DIAGNOSIS — I482 Chronic atrial fibrillation: Secondary | ICD-10-CM | POA: Diagnosis present

## 2016-01-04 DIAGNOSIS — Z992 Dependence on renal dialysis: Secondary | ICD-10-CM | POA: Diagnosis not present

## 2016-01-04 DIAGNOSIS — N2581 Secondary hyperparathyroidism of renal origin: Secondary | ICD-10-CM | POA: Diagnosis present

## 2016-01-04 DIAGNOSIS — D72829 Elevated white blood cell count, unspecified: Secondary | ICD-10-CM | POA: Diagnosis present

## 2016-01-04 DIAGNOSIS — K921 Melena: Principal | ICD-10-CM | POA: Diagnosis present

## 2016-01-04 DIAGNOSIS — N185 Chronic kidney disease, stage 5: Secondary | ICD-10-CM | POA: Diagnosis not present

## 2016-01-04 DIAGNOSIS — D649 Anemia, unspecified: Secondary | ICD-10-CM

## 2016-01-04 DIAGNOSIS — I248 Other forms of acute ischemic heart disease: Secondary | ICD-10-CM | POA: Diagnosis present

## 2016-01-04 HISTORY — DX: Secondary hyperparathyroidism of renal origin: N25.81

## 2016-01-04 HISTORY — DX: Gastro-esophageal reflux disease without esophagitis: K21.9

## 2016-01-04 HISTORY — DX: End stage renal disease: N18.6

## 2016-01-04 LAB — PREPARE RBC (CROSSMATCH)

## 2016-01-04 LAB — BASIC METABOLIC PANEL
Anion gap: 14 (ref 5–15)
BUN: 54 mg/dL — AB (ref 6–20)
CHLORIDE: 96 mmol/L — AB (ref 101–111)
CO2: 31 mmol/L (ref 22–32)
Calcium: 8.6 mg/dL — ABNORMAL LOW (ref 8.9–10.3)
Creatinine, Ser: 6.85 mg/dL — ABNORMAL HIGH (ref 0.61–1.24)
GFR, EST AFRICAN AMERICAN: 9 mL/min — AB (ref >60)
GFR, EST NON AFRICAN AMERICAN: 8 mL/min — AB (ref >60)
Glucose, Bld: 171 mg/dL — ABNORMAL HIGH (ref 65–99)
POTASSIUM: 3.7 mmol/L (ref 3.5–5.1)
SODIUM: 141 mmol/L (ref 135–145)

## 2016-01-04 LAB — PROTIME-INR
INR: >10
Prothrombin Time: >90 seconds — ABNORMAL HIGH (ref 11.4–15.0)
Prothrombin Time: >90 seconds — ABNORMAL HIGH (ref 11.4–15.0)

## 2016-01-04 LAB — CBC
HEMATOCRIT: 15.5 % — AB (ref 40.0–52.0)
Hemoglobin: 5 g/dL — ABNORMAL LOW (ref 13.0–18.0)
MCH: 30.6 pg (ref 26.0–34.0)
MCHC: 32.7 g/dL (ref 32.0–36.0)
MCV: 93.6 fL (ref 80.0–100.0)
Platelets: 197 10*3/uL (ref 150–440)
RBC: 1.65 MIL/uL — AB (ref 4.40–5.90)
RDW: 16.9 % — AB (ref 11.5–14.5)
WBC: 9.6 10*3/uL (ref 3.8–10.6)

## 2016-01-04 LAB — APTT: aPTT: 100 seconds — ABNORMAL HIGH (ref 24–36)

## 2016-01-04 LAB — HEMOGLOBIN: Hemoglobin: 4.9 g/dL — CL (ref 13.0–18.0)

## 2016-01-04 LAB — GLUCOSE, CAPILLARY: GLUCOSE-CAPILLARY: 136 mg/dL — AB (ref 65–99)

## 2016-01-04 LAB — ABO/RH: ABO/RH(D): O POS

## 2016-01-04 MED ORDER — SIMVASTATIN 20 MG PO TABS
20.0000 mg | ORAL_TABLET | Freq: Every day | ORAL | Status: DC
  Administered 2016-01-04 – 2016-01-09 (×5): 20 mg via ORAL
  Filled 2016-01-04 (×5): qty 1

## 2016-01-04 MED ORDER — VITAMIN K1 10 MG/ML IJ SOLN
10.0000 mg | Freq: Once | INTRAVENOUS | Status: AC
  Administered 2016-01-05: 10 mg via INTRAVENOUS
  Filled 2016-01-04 (×2): qty 1

## 2016-01-04 MED ORDER — ONDANSETRON HCL 4 MG/2ML IJ SOLN
4.0000 mg | Freq: Once | INTRAMUSCULAR | Status: AC
  Administered 2016-01-04: 4 mg via INTRAVENOUS
  Filled 2016-01-04: qty 2

## 2016-01-04 MED ORDER — SODIUM CHLORIDE 0.9 % IV BOLUS (SEPSIS)
500.0000 mL | Freq: Once | INTRAVENOUS | Status: AC
  Administered 2016-01-04: 500 mL via INTRAVENOUS

## 2016-01-04 MED ORDER — SODIUM CHLORIDE 0.9 % IV SOLN: 10.0000 mL/h | Freq: Once | INTRAVENOUS | Status: DC

## 2016-01-04 MED ORDER — ONDANSETRON HCL 4 MG PO TABS: 4.0000 mg | ORAL_TABLET | Freq: Four times a day (QID) | ORAL | Status: DC | PRN

## 2016-01-04 MED ORDER — ACETAMINOPHEN 325 MG PO TABS: 650.0000 mg | ORAL_TABLET | Freq: Four times a day (QID) | ORAL | Status: DC | PRN

## 2016-01-04 MED ORDER — SODIUM CHLORIDE 0.9 % IV SOLN
8.0000 mg/h | INTRAVENOUS | Status: AC
  Administered 2016-01-04 – 2016-01-07 (×3): 8 mg/h via INTRAVENOUS
  Filled 2016-01-04 (×5): qty 80

## 2016-01-04 MED ORDER — SODIUM CHLORIDE 0.9 % IV SOLN
80.0000 mg | Freq: Once | INTRAVENOUS | Status: DC
  Filled 2016-01-04: qty 80

## 2016-01-04 MED ORDER — ACETAMINOPHEN 650 MG RE SUPP: 650.0000 mg | Freq: Four times a day (QID) | RECTAL | Status: DC | PRN

## 2016-01-04 MED ORDER — ONDANSETRON HCL 4 MG/2ML IJ SOLN
4.0000 mg | Freq: Four times a day (QID) | INTRAMUSCULAR | Status: DC | PRN
  Administered 2016-01-04 – 2016-01-06 (×3): 4 mg via INTRAVENOUS
  Filled 2016-01-04 (×3): qty 2

## 2016-01-04 MED ORDER — SODIUM CHLORIDE 0.9 % IV BOLUS (SEPSIS)
250.0000 mL | Freq: Once | INTRAVENOUS | Status: AC
Start: 2016-01-04 — End: 2016-01-04
  Administered 2016-01-04: 250 mL via INTRAVENOUS

## 2016-01-04 NOTE — ED Provider Notes (Signed)
[facility] Emergency Department Provider Note  ____________________________________________  Time seen: Approximately 4:42 PM  I have reviewed the triage vital signs and the nursing notes.   HISTORY  Chief Complaint Fatigue    HPI Rhetta Muraerry Spacek is a 60 y.o. male hypertension, hyperlipidemia, end-stage renal disease on dialysis, last dialyzed today, atrial fibrillation on Coumadinwho presents for evaluation of low blood counts from dialysis, unknown onset, severe, and no known modifying factors. Patient reports that he was fully dialyzed today, they checked his blood counts, told him his blood count was critically low and sent him to the emergency department for evaluation. He denies coughing up blood. He has had dark stools intermittently. He denies any frank blood in stools. No chest pain or difficulty breathing. He denies any vomiting, diarrhea, fevers or chills.   Past Medical History  Diagnosis Date  . Renal disorder   . Diabetes mellitus without complication (HCC)   . Hypertension   . Hyperlipidemia     There are no active problems to display for this patient.   Past Surgical History  Procedure Laterality Date  . Peripheral vascular catheterization N/A 11/29/2015    Procedure: A/V Shuntogram/Fistulagram;  Surgeon: Renford DillsGregory G Schnier, MD;  Location: [facility] INVASIVE CV LAB;  Service: Cardiovascular;  Laterality: N/A;  . Peripheral vascular catheterization N/A 11/29/2015    Procedure: A/V Shunt Intervention;  Surgeon: Renford DillsGregory G Schnier, MD;  Location: [facility] INVASIVE CV LAB;  Service: Cardiovascular;  Laterality: N/A;    Current Outpatient Rx  Name  Route  Sig  Dispense  Refill  . aspirin 81 MG chewable tablet   Oral   Chew 81 mg by mouth every evening.         Marland Kitchen. b complex-vitamin c-folic acid (NEPHRO-VITE) 0.8 MG TABS tablet   Oral   Take 1 tablet by mouth daily.         . carvedilol (COREG) 25 MG tablet   Oral   Take 25 mg by mouth 2 (two)  times daily as needed (depending on BP).          Marland Kitchen. diltiazem (DILACOR XR) 120 MG 24 hr capsule   Oral   Take 120 mg by mouth 2 (two) times daily as needed (depending on BP).          Marland Kitchen. esomeprazole (NEXIUM) 40 MG capsule   Oral   Take 40 mg by mouth daily.         . insulin aspart (NOVOLOG) 100 UNIT/ML injection   Subcutaneous   Inject 0-20 Units into the skin 3 (three) times daily with meals as needed for high blood sugar. Pt uses per sliding scale.         . sevelamer carbonate (RENVELA) 800 MG tablet   Oral   Take 1,600-3,200 mg by mouth 5 (five) times daily. Pt takes four capsules three times a day with meals and two capsules two times a day with snacks.         . simvastatin (ZOCOR) 20 MG tablet   Oral   Take 20 mg by mouth at bedtime.          Marland Kitchen. warfarin (COUMADIN) 4 MG tablet   Oral   Take 4 mg by mouth every evening.         . traMADol (ULTRAM) 50 MG tablet   Oral   Take 1 tablet (50 mg total) by mouth every 6 (six) hours as needed. Patient not taking: Reported on 11/29/2015   9  tablet   0     Allergies Review of patient's allergies indicates no known allergies.  No family history on file.  Social History Social History  Substance Use Topics  . Smoking status: Never Smoker   . Smokeless tobacco: None  . Alcohol Use: No    Review of Systems Constitutional: No fever/chills Eyes: No visual changes. ENT: No sore throat. Cardiovascular: Denies chest pain. Respiratory: Denies shortness of breath. Gastrointestinal: No abdominal pain.  No nausea, no vomiting.  No diarrhea.  No constipation. Genitourinary: Negative for dysuria. Musculoskeletal: Negative for back pain. Skin: Negative for rash. Neurological: Negative for headaches, focal weakness or numbness.  10-point ROS otherwise negative.  ____________________________________________   PHYSICAL EXAM:  VITAL SIGNS: ED Triage Vitals  Enc Vitals Group     BP 01/04/16 1446 91/63 mmHg      Pulse Rate 01/04/16 1446 84     Resp 01/04/16 1446 18     Temp 01/04/16 1446 98 F (36.7 C)     Temp Source 01/04/16 1446 Oral     SpO2 01/04/16 1446 97 %     Weight 01/04/16 1446 235 lb (106.595 kg)     Height 01/04/16 1446 6' (1.829 m)     Head Cir --      Peak Flow --      Pain Score 01/04/16 1447 0     Pain Loc --      Pain Edu? --      Excl. in GC? --     Constitutional: Alert and oriented. Well appearing and in no acute distress. Eyes: Conjunctivae are normal. PERRL. EOMI. Head: Atraumatic. Nose: No congestion/rhinnorhea. Mouth/Throat: Mucous membranes are moist.  Oropharynx non-erythematous. Neck: No stridor.   Cardiovascular: mildly tachycardic rate, regular rhythm. Grossly normal heart sounds.  Good peripheral circulation. Respiratory: Normal respiratory effort.  No retractions. Lungs CTAB. Gastrointestinal: Soft and nontender. No distention.  No CVA tenderness. Genitourinary: deferred Rectal: Melena in the rectal vault is strongly guaiac positive. Musculoskeletal: No lower extremity tenderness nor edema.  No joint effusions. Neurologic:  Normal speech and language. No gross focal neurologic deficits are appreciated. 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 Skin:  Skin is warm, dry and intact. No rash noted. Psychiatric: Mood and affect are normal. Speech and behavior are normal.  ____________________________________________   LABS (all labs ordered are listed, but only abnormal results are displayed)  Labs Reviewed  BASIC METABOLIC PANEL - Abnormal; Notable for the following:    Chloride 96 (*)    Glucose, Bld 171 (*)    BUN 54 (*)    Creatinine, Ser 6.85 (*)    Calcium 8.6 (*)    GFR calc non Af Amer 8 (*)    GFR calc Af Amer 9 (*)    All other components within normal limits  CBC - Abnormal; Notable for the following:    RBC 1.65 (*)    Hemoglobin 5.0 (*)    HCT 15.5 (*)    RDW 16.9 (*)    All other components within normal limits   GLUCOSE, CAPILLARY - Abnormal; Notable for the following:    Glucose-Capillary 136 (*)    All other components within normal limits  PROTIME-INR  APTT  CBG MONITORING, ED  TYPE AND SCREEN  ABO/RH  PREPARE RBC (CROSSMATCH)  PREPARE RBC (CROSSMATCH)  PREPARE FRESH FROZEN PLASMA   ____________________________________________  EKG  ED ECG REPORT I, Gayla Doss, the attending physician, personally viewed and interpreted this ECG.  Date: 01/04/2016  EKG Time: 16:38  Rate: 104  Rhythm: sinus tachycardia  Axis: Borderline left axis  Intervals:none  ST&T Change: No acute ST elevation.  ____________________________________________  RADIOLOGY  CXR ____________________________________________   PROCEDURES  Procedure(s) performed: None  Critical Care performed: Yes, see critical care note(s). Total critical care time spent 35 minutes.  ____________________________________________   INITIAL IMPRESSION / ASSESSMENT AND PLAN / ED COURSE  Pertinent labs & imaging results that were available during my care of the patient were reviewed by me and considered in my medical decision making (see chart for details).  Amauris Debois is a 60 y.o. male hypertension, hyperlipidemia, end-stage renal disease on dialysis, last dialyzed today, atrial fibrillation on Coumadinwho presents for evaluation of low blood counts from dialysis. On exam, he is nontoxic appearing and in no acute distress. Mildly tachycardic with heart rate of 105 bpm, mildly hypotensive but maintaining adequate maps of greater than 60. Hemoglobin is 5 and he is severely anemic. INR at dialysis was greater than 10, still awaiting re-check here but he will need reversal. Clinical picture is consistent with acute upper GI bleed given melena. We'll give IV fluids, he has been consented for blood, FFP, vitamin K. We will give Protonix. Case discussed with Dr. Bluford Kaufmann of GI. Case discussed with Dr. Cherlynn Kaiser, hospitalist, for admission  at 5:50 PM. ____________________________________________   FINAL CLINICAL IMPRESSION(S) / ED DIAGNOSES  Final diagnoses:  Acute upper GI bleed  Anemia, unspecified anemia type      Gayla Doss, MD 01/04/16 1800

## 2016-01-04 NOTE — Progress Notes (Signed)
Pt BP low 78/51. Dr. Cherlynn KaiserSainani notified. Orders received for STAT 250 ML bolus. Primary RN to continue to monitor.

## 2016-01-04 NOTE — ED Notes (Signed)
Sent over by dialysis today.  Told him that his hemoglobin was low.  Has been feeling weak since yesterday.  Did complete dialysis today.

## 2016-01-04 NOTE — Progress Notes (Signed)
Pt BP Low 77/51  MAP 56 .  Pt states that he feels a little light headed. Dr. Cherlynn KaiserSainani notified. Orders received to continue to give Blood. Notify MD for MAP less than 55.

## 2016-01-04 NOTE — H&P (Signed)
[facility] [place] [facility]   PATIENT NAME: [name] [name]    MR#:  [id number]  DATE OF BIRTH:  January 20, 1956  DATE OF ADMISSION:  01/04/2016  PRIMARY CARE PHYSICIAN: Clydie Braun, MD   REQUESTING/REFERRING PHYSICIAN: Dr. Chari Manning  CHIEF COMPLAINT:   Chief Complaint  Patient presents with  . Fatigue  Exertional SOB  HISTORY OF PRESENT ILLNESS:  [name] [name]  is a 60 y.o. male with a known history of end-stage renal disease on hemodialysis, hypertension, hyperlipidemia, diabetes, secondary hyperparathyroidism, chronic atrial fibrillation who presents to the hospital due to fatigue and exertional dyspnea and noted to be anemic. He was at hemodialysis today and was not feeling well and notifed the staff at dialysis checked his blood work and he was noted to be profoundly anemic. He says that he has been having melanotic stools on and off now for the past few months. He denies any chest pain, abdominal pain, nausea, vomiting, diarrhea or any other associated symptoms presently. To the emergency room and was noted to have a hemoglobin of 5.5 and INR of greater than 10. Hospitalist services were contacted for further treatment and evaluation.  PAST MEDICAL HISTORY:   Past Medical History  Diagnosis Date  . Renal disorder   . Diabetes mellitus without complication (HCC)   . Hypertension   . Hyperlipidemia   . ESRD (end stage renal disease) (HCC)   . Secondary hyperparathyroidism (HCC)   . GERD (gastroesophageal reflux disease)     PAST SURGICAL HISTORY:   Past Surgical History  Procedure Laterality Date  . Peripheral vascular catheterization N/A 11/29/2015    Procedure: A/V Shuntogram/Fistulagram;  Surgeon: [name], MD;  Location: [facility] INVASIVE CV LAB;  Service: Cardiovascular;  Laterality: N/A;  . Peripheral vascular catheterization N/A 11/29/2015    Procedure: A/V Shunt Intervention;  Surgeon: [name], MD;  Location: [facility]  INVASIVE CV LAB;  Service: Cardiovascular;  Laterality: N/A;    SOCIAL HISTORY:   Social History  Substance Use Topics  . Smoking status: Never Smoker   . Smokeless tobacco: Not on file  . Alcohol Use: No    FAMILY HISTORY:   Family History  Problem Relation Age of Onset  . Lung cancer Father   . Heart attack Mother     DRUG ALLERGIES:  No Known Allergies  REVIEW OF SYSTEMS:   Review of Systems  Constitutional: Negative for fever and weight loss.  HENT: Negative for congestion, nosebleeds and tinnitus.   Eyes: Negative for blurred vision, double vision and redness.  Respiratory: Negative for cough, hemoptysis and shortness of breath.   Cardiovascular: Negative for chest pain, orthopnea, leg swelling and PND.  Gastrointestinal: Positive for melena. Negative for nausea, vomiting, abdominal pain and diarrhea.  Genitourinary: Negative for dysuria, urgency and hematuria.  Musculoskeletal: Negative for joint pain and falls.  Neurological: Positive for weakness. Negative for dizziness, tingling, sensory change, focal weakness, seizures and headaches.  Endo/Heme/Allergies: Negative for polydipsia. Does not bruise/bleed easily.  Psychiatric/Behavioral: Negative for depression and memory loss. The patient is not nervous/anxious.     MEDICATIONS AT HOME:   Prior to Admission medications   Medication Sig Start Date End Date Taking? Authorizing Provider  aspirin 81 MG chewable tablet Chew 81 mg by mouth every evening.   Yes Historical Provider, MD  b complex-vitamin c-folic acid (NEPHRO-VITE) 0.8 MG TABS tablet Take 1 tablet by mouth daily.   Yes Historical Provider, MD  carvedilol (COREG) 25 MG tablet  Take 25 mg by mouth 2 (two) times daily as needed (depending on BP).    Yes Historical Provider, MD  diltiazem (DILACOR XR) 120 MG 24 hr capsule Take 120 mg by mouth 2 (two) times daily as needed (depending on BP).    Yes Historical Provider, MD  esomeprazole (NEXIUM) 40 MG capsule  Take 40 mg by mouth daily.   Yes Historical Provider, MD  insulin aspart (NOVOLOG) 100 UNIT/ML injection Inject 0-20 Units into the skin 3 (three) times daily with meals as needed for high blood sugar. Pt uses per sliding scale.   Yes Historical Provider, MD  sevelamer carbonate (RENVELA) 800 MG tablet Take 1,600-3,200 mg by mouth 5 (five) times daily. Pt takes four capsules three times a day with meals and two capsules two times a day with snacks.   Yes Historical Provider, MD  simvastatin (ZOCOR) 20 MG tablet Take 20 mg by mouth at bedtime.    Yes Historical Provider, MD  warfarin (COUMADIN) 4 MG tablet Take 4 mg by mouth every evening.   Yes Historical Provider, MD  traMADol (ULTRAM) 50 MG tablet Take 1 tablet (50 mg total) by mouth every 6 (six) hours as needed. Patient not taking: Reported on 11/29/2015 09/08/15   Chinita Pesterari B Triplett, FNP      VITAL SIGNS:  Blood pressure 94/56, pulse 105, temperature 98 F (36.7 C), temperature source Oral, resp. rate 19, height 6' (1.829 m), weight 106.595 kg (235 lb), SpO2 100 %.  PHYSICAL EXAMINATION:  Physical Exam  GENERAL:  60 y.o.-year-old patient lying in the bed with no acute distress.  EYES: Pupils equal, round, reactive to light and accommodation. No scleral icterus. Extraocular muscles intact.  HEENT: Head atraumatic, normocephalic. Oropharynx and nasopharynx clear. No oropharyngeal erythema, moist oral mucosa  NECK:  Supple, no jugular venous distention. No thyroid enlargement, no tenderness.  LUNGS: Normal breath sounds bilaterally, no wheezing, rales, rhonchi. No use of accessory muscles of respiration.  CARDIOVASCULAR: S1, S2 Irregular. No murmurs, rubs, gallops, clicks.  ABDOMEN: Soft, nontender, nondistended. Bowel sounds present. No organomegaly or mass.  EXTREMITIES: No pedal edema, cyanosis, or clubbing. + 2 pedal & radial pulses b/l.  Left foot everted due to hx of previous fracture.   NEUROLOGIC: Cranial nerves II through XII are  intact. No focal Motor or sensory deficits appreciated b/l. Globally weak PSYCHIATRIC: The patient is alert and oriented x 3. Good affect.  SKIN: No obvious rash, lesion, or ulcer.   Left upper extremity AV fistula with good bruit and thrill.  LABORATORY PANEL:   CBC  Recent Labs Lab 01/04/16 1508  WBC 9.6  HGB 5.0*  HCT 15.5*  PLT 197   ------------------------------------------------------------------------------------------------------------------  Chemistries   Recent Labs Lab 01/04/16 1508  NA 141  K 3.7  CL 96*  CO2 31  GLUCOSE 171*  BUN 54*  CREATININE 6.85*  CALCIUM 8.6*   ------------------------------------------------------------------------------------------------------------------  Cardiac Enzymes No results for input(s): TROPONINI in the last 168 hours. ------------------------------------------------------------------------------------------------------------------  RADIOLOGY:  No results found.   IMPRESSION AND PLAN:   60 year old male with past history of end-stage renal disease on hemodialysis, hypertension, GERD, chronic atrial fibrillation, who presents to the hospital due to weakness, fatigue and noted to be anemic with melanotic stools.  #1 symptomatic anemia-this is the cause of patient's shortness of breath, weakness. -Patient's baseline hemoglobin is around 8-9 and presented to the hospital with a hemoglobin of 5. -I will transfuse him 2 units of packed red blood cells follow hemoglobin.  Hold Coumadin, aspirin. -Gastroenterology consult for a upper GI bleed.  # 2 upper GI bleed-this is the cause of patient's symptomatic anemia. -I will transfuse the patient 2 units of packed blood cells. Follow hemoglobin. -Continue Protonix drip, gastroenterology consult. Place on clear liquid diet. hold Coumadin, aspirin.  #3 acquired coagulopathy-due to patient taking Coumadin for his chronic atrial fibrillation. -He now presents with a acute  anemia and GI bleed. I will hold his Coumadin, he is going to get FFP and vitamin K. I will follow his PT/INR.  #4 chronic atrial fibrillation-currently rate controlled. Hold Coumadin. -Patient's blood pressures in the low side therefore I will hold his Cardizem.  #5 secondary hyperparathyroidism-continue Renvela.  #6 hyperlipidemia-continue Zocor.  #7 end-stage renal disease on hemodialysis-I will consult nephrology. -Patient gets dialysis on Monday Wednesday and Friday.  #8 HTN - bp on low side given anemia, GI bleed. Hold all anti-HTN for now.    All the records are reviewed and case discussed with ED provider. Management plans discussed with the patient, family and they are in agreement.  CODE STATUS: Full  TOTAL TIME TAKING CARE OF THIS PATIENT: 45 minutes.    Houston Siren M.D on 01/04/2016 at 6:32 PM  Between 7am to 6pm - Pager - [phone]  After 6pm go to [email or web address] - password [email or web address] [facility]  [facility]  Office  [phone]  CC: Primary care physician; Clydie Braun, MD

## 2016-01-05 DIAGNOSIS — K922 Gastrointestinal hemorrhage, unspecified: Secondary | ICD-10-CM | POA: Insufficient documentation

## 2016-01-05 LAB — CBC
HEMATOCRIT: 17.5 % — AB (ref 40.0–52.0)
HEMOGLOBIN: 5.8 g/dL — AB (ref 13.0–18.0)
MCH: 30.7 pg (ref 26.0–34.0)
MCHC: 33.3 g/dL (ref 32.0–36.0)
MCV: 92.1 fL (ref 80.0–100.0)
Platelets: 153 10*3/uL (ref 150–440)
RBC: 1.9 MIL/uL — AB (ref 4.40–5.90)
RDW: 15.9 % — ABNORMAL HIGH (ref 11.5–14.5)
WBC: 9 10*3/uL (ref 3.8–10.6)

## 2016-01-05 LAB — BASIC METABOLIC PANEL
ANION GAP: 11 (ref 5–15)
BUN: 66 mg/dL — ABNORMAL HIGH (ref 6–20)
CHLORIDE: 100 mmol/L — AB (ref 101–111)
CO2: 31 mmol/L (ref 22–32)
CREATININE: 7.98 mg/dL — AB (ref 0.61–1.24)
Calcium: 8.7 mg/dL — ABNORMAL LOW (ref 8.9–10.3)
GFR calc non Af Amer: 7 mL/min — ABNORMAL LOW (ref >60)
GFR, EST AFRICAN AMERICAN: 8 mL/min — AB (ref >60)
Glucose, Bld: 132 mg/dL — ABNORMAL HIGH (ref 65–99)
POTASSIUM: 4.2 mmol/L (ref 3.5–5.1)
SODIUM: 142 mmol/L (ref 135–145)

## 2016-01-05 LAB — PREPARE RBC (CROSSMATCH)

## 2016-01-05 LAB — HEMOGLOBIN: Hemoglobin: 8.4 g/dL — ABNORMAL LOW (ref 13.0–18.0)

## 2016-01-05 LAB — PROTIME-INR
INR: 5.35
Prothrombin Time: 47.3 seconds — ABNORMAL HIGH (ref 11.4–15.0)

## 2016-01-05 MED ORDER — EPOETIN ALFA 10000 UNIT/ML IJ SOLN
10000.0000 [IU] | INTRAMUSCULAR | Status: DC
  Administered 2016-01-07 – 2016-01-10 (×2): 10000 [IU] via INTRAVENOUS
  Filled 2016-01-05: qty 1

## 2016-01-05 MED ORDER — SODIUM CHLORIDE 0.9 % IV SOLN
Freq: Once | INTRAVENOUS | Status: AC
  Administered 2016-01-05: 10:00:00 via INTRAVENOUS

## 2016-01-05 NOTE — Progress Notes (Signed)
MD called for critical value of 5.35 INR.No new orders, will continue to assess.

## 2016-01-05 NOTE — Consult Note (Signed)
[facility]  [address] [place], [place] [address] Phone: [phone] Fax : [phone]  Consultation  Referring Provider:     No ref. provider found Primary Care Physician:  Clydie Braun, MD Primary Gastroenterologist:  None         Reason for Consultation:     G.I. bleeding  Date of Admission:  01/04/2016 Date of Consultation:  01/05/2016         HPI:   [name] [name] is a 60 y.o. male  Who was admitted with a hemoglobin a 4.9. The patient has been on Coumadin and states that he has had dark stools since starting the Coumadin. The patient was found to have an INR over 10 on admission and receive vitamin K and fresh frozen plasma. The patient reports chronic epigastric pain. He denies any anti-inflammatory medication use. He also denies any history of having a colonoscopy or upper endoscopy in the past. He states that he was given the option to have a colonoscopy in the past but did not want to do it. He also suffers from chronic renal failure and is on hemodialysis.  Past Medical History  Diagnosis Date  . Renal disorder   . Diabetes mellitus without complication (HCC)   . Hypertension   . Hyperlipidemia   . ESRD (end stage renal disease) (HCC)   . Secondary hyperparathyroidism (HCC)   . GERD (gastroesophageal reflux disease)     Past Surgical History  Procedure Laterality Date  . Peripheral vascular catheterization N/A 11/29/2015    Procedure: A/V Shuntogram/Fistulagram;  Surgeon: [name], MD;  Location: [facility] INVASIVE CV LAB;  Service: Cardiovascular;  Laterality: N/A;  . Peripheral vascular catheterization N/A 11/29/2015    Procedure: A/V Shunt Intervention;  Surgeon: [name], MD;  Location: [facility] INVASIVE CV LAB;  Service: Cardiovascular;  Laterality: N/A;    Prior to Admission medications   Medication Sig Start Date End Date Taking? Authorizing Provider  aspirin 81 MG chewable tablet Chew 81 mg by mouth every evening.   Yes  Historical Provider, MD  b complex-vitamin c-folic acid (NEPHRO-VITE) 0.8 MG TABS tablet Take 1 tablet by mouth daily.   Yes Historical Provider, MD  carvedilol (COREG) 25 MG tablet Take 25 mg by mouth 2 (two) times daily as needed (depending on BP).    Yes Historical Provider, MD  diltiazem (DILACOR XR) 120 MG 24 hr capsule Take 120 mg by mouth 2 (two) times daily as needed (depending on BP).    Yes Historical Provider, MD  esomeprazole (NEXIUM) 40 MG capsule Take 40 mg by mouth daily.   Yes Historical Provider, MD  insulin aspart (NOVOLOG) 100 UNIT/ML injection Inject 0-20 Units into the skin 3 (three) times daily with meals as needed for high blood sugar. Pt uses per sliding scale.   Yes Historical Provider, MD  sevelamer carbonate (RENVELA) 800 MG tablet Take 1,600-3,200 mg by mouth 5 (five) times daily. Pt takes four capsules three times a day with meals and two capsules two times a day with snacks.   Yes Historical Provider, MD  simvastatin (ZOCOR) 20 MG tablet Take 20 mg by mouth at bedtime.    Yes Historical Provider, MD  warfarin (COUMADIN) 4 MG tablet Take 4 mg by mouth every evening.   Yes Historical Provider, MD  traMADol (ULTRAM) 50 MG tablet Take 1 tablet (50 mg total) by mouth every 6 (six) hours as needed. Patient not taking: Reported on 11/29/2015 09/08/15   Chinita Pester,  FNP    Family History  Problem Relation Age of Onset  . Lung cancer Father   . Heart attack Mother      Social History  Substance Use Topics  . Smoking status: Never Smoker   . Smokeless tobacco: None  . Alcohol Use: No    Allergies as of 01/04/2016  . (No Known Allergies)    Review of Systems:    All systems reviewed and negative except where noted in HPI and epigastric abdominal pain.   Physical Exam:  Vital signs in last 24 hours: Temp:  [98.3 F (36.8 C)-99.8 F (37.7 C)] 98.8 F (37.1 C) (01/12 1430) Pulse Rate:  [98-109] 100 (01/12 1430) Resp:  [14-20] 16 (01/12 1430) BP:  (77-111)/(46-71) 111/71 mmHg (01/12 1430) SpO2:  [92 %-100 %] 95 % (01/12 1430) Weight:  [223 lb 9.6 oz (101.424 kg)] 223 lb 9.6 oz (101.424 kg) (01/11 1938) Last BM Date: 01/04/16 General:   Pleasant, cooperative in NAD Head:  Normocephalic and atraumatic. Eyes:   No icterus.   Conjunctiva pink. PERRLA. Ears:  Normal auditory acuity. Neck:  Supple; no masses or thyroidomegaly Lungs: Respirations even and unlabored. Lungs clear to auscultation bilaterally.   No wheezes, crackles, or rhonchi.  Heart:  Regular rate and rhythm;  Without murmur, clicks, rubs or gallops Abdomen:  Soft, nondistended, Epigastric tenderness. Normal bowel sounds. No appreciable masses or hepatomegaly.  No rebound or guarding.  Rectal:  Not performed. Msk:  Symmetrical without gross deformities.    Extremities:  Without edema, cyanosis or clubbing. Neurologic:  Alert and oriented x3;  grossly normal neurologically. Skin:  Intact without significant lesions or rashes. Cervical Nodes:  No significant cervical adenopathy. Psych:  Alert and cooperative. Normal affect.  LAB RESULTS:  Recent Labs  01/04/16 0924 01/04/16 1508 01/05/16 0416  WBC  --  9.6 9.0  HGB 4.9* 5.0* 5.8*  HCT  --  15.5* 17.5*  PLT  --  197 153   BMET  Recent Labs  01/04/16 1508 01/05/16 0416  NA 141 142  K 3.7 4.2  CL 96* 100*  CO2 31 31  GLUCOSE 171* 132*  BUN 54* 66*  CREATININE 6.85* 7.98*  CALCIUM 8.6* 8.7*   LFT No results for input(s): PROT, ALBUMIN, AST, ALT, ALKPHOS, BILITOT, BILIDIR, IBILI in the last 72 hours. PT/INR  Recent Labs  01/04/16 1512 01/05/16 0416  LABPROT >90.0* 47.3*  INR >10.00* 5.35*    STUDIES: No results found.    Impression / Plan:   [name] [name] is a 60 y.o. y/o male with With a history of chronic renal failure on dialysis to has had chronic black stools. The patient is on Coumadin and came in with an INR greater than 10. The patient was also profoundly anemic on admission.The patient  is now receiving another unit of blood. The patient will be set up for an upper endoscopy for tomorrow.I have discussed risks & benefits which include, but are not limited to, bleeding, infection, perforation & drug reaction.  The patient agrees with this plan & written consent will be obtained.     Thank you for involving me in the care of this patient.      LOS: 1 day   Darlina Rumpfaren Arlind Klingerman, MD  01/05/2016, 5:59 PM   Note: This dictation was prepared with Dragon dictation along with smaller phrase technology. Any transcriptional errors that result from this process are unintentional.

## 2016-01-05 NOTE — Progress Notes (Signed)
Primary Nurse spoke to Dr. Sheryle Hailiamond on phone in regard to low HGB of 5.8 and low BP. MD to place orders. Primary nurse to continue to monitor.

## 2016-01-05 NOTE — Care Management (Signed)
Patient admitted with Acute posthemorrhagic anemia.  Patient lives at home with his "lady friend". [facility] [address]  Patient states that his insurance always covers his medications.  Patient has a walker and wheelchair at home, ans states that he as to use them at all times.  Patient is currently on acute O2, nursing to wean.  Patient uses [facility] for transportation.  His girlfriend also transports him as well.  Patient is an HD patient, Ivor ReiningKim Riddle HD liaison notified.

## 2016-01-05 NOTE — Progress Notes (Signed)
[facility]   PATIENT NAME: [name] [name]    MR#:  [id number]  DATE OF BIRTH:  [birth year]  SUBJECTIVE:  CHIEF COMPLAINT:   Chief Complaint  Patient presents with  . Fatigue   patient is a 60 year old male who presents to the hospital with complaints of right upper quadrant, epigastric abdominal pain and melena, also fatigue, exertional dyspnea. In emergency room, he was noted to be severely anemic with hemoglobin level of 4.9. Patient was transfused 2 units of packed blood cells after which his hemoglobin level improved to 5.82 more units of plan to be transfused now. Patient was also noted to be coagulopathic with INR more than 10, he was given fresh frozen plasma and vitamin K and his pro time improved to 47.3 and INR of 5.35. He has no more bleeding, however, complains of epigastric and right upper quadrant abdominal pain. Overall, he feels much more comfortable today after transfusion. Apparently his blood pressure was low in 80s in a hemodialysis center yesterday.   Review of Systems  Gastrointestinal: Positive for abdominal pain.    VITAL SIGNS: Blood pressure 98/66, pulse 99, temperature 98.9 F (37.2 C), temperature source Oral, resp. rate 17, height 6' (1.829 m), weight 101.424 kg (223 lb 9.6 oz), SpO2 99 %.  PHYSICAL EXAMINATION:   GENERAL:  60 y.o.-year-old patient lying in the bed with no acute distress.  EYES: Pupils equal, round, reactive to light and accommodation. No scleral icterus. Extraocular muscles intact.  HEENT: Head atraumatic, normocephalic. Oropharynx and nasopharynx clear.  NECK:  Supple, no jugular venous distention. No thyroid enlargement, no tenderness.  LUNGS: Normal breath sounds bilaterally, no wheezing, rales,rhonchi or crepitation. No use of accessory muscles of respiration.  CARDIOVASCULAR: S1, S2 normal. No murmurs, rubs, or gallops.  ABDOMEN: Soft, some tenderness epigastric, right upper quadrant area  but no rebound or guarding was noted, nondistended. Bowel sounds present. No organomegaly or mass.  EXTREMITIES: No pedal edema, cyanosis, or clubbing.  NEUROLOGIC: Cranial nerves II through XII are intact. Muscle strength 5/5 in all extremities. Sensation intact. Gait not checked.  PSYCHIATRIC: The patient is alert and oriented x 3.  SKIN: No obvious rash, lesion, or ulcer.   ORDERS/RESULTS REVIEWED:   CBC  Recent Labs Lab 01/04/16 0924 01/04/16 1508 01/05/16 0416  WBC  --  9.6 9.0  HGB 4.9* 5.0* 5.8*  HCT  --  15.5* 17.5*  PLT  --  197 153  MCV  --  93.6 92.1  MCH  --  30.6 30.7  MCHC  --  32.7 33.3  RDW  --  16.9* 15.9*   ------------------------------------------------------------------------------------------------------------------  Chemistries   Recent Labs Lab 01/04/16 1508 01/05/16 0416  NA 141 142  K 3.7 4.2  CL 96* 100*  CO2 31 31  GLUCOSE 171* 132*  BUN 54* 66*  CREATININE 6.85* 7.98*  CALCIUM 8.6* 8.7*   ------------------------------------------------------------------------------------------------------------------ estimated creatinine clearance is 12.4 mL/min (by C-G formula based on Cr of 7.98). ------------------------------------------------------------------------------------------------------------------ No results for input(s): TSH, T4TOTAL, T3FREE, THYROIDAB in the last 72 hours.  Invalid input(s): FREET3  Cardiac Enzymes No results for input(s): CKMB, TROPONINI, MYOGLOBIN in the last 168 hours.  Invalid input(s): CK ------------------------------------------------------------------------------------------------------------------ Invalid input(s): POCBNP ---------------------------------------------------------------------------------------------------------------  RADIOLOGY: No results found.  EKG:  Orders placed or performed during the hospital encounter of 01/04/16  . ED EKG  . ED EKG    ASSESSMENT AND PLAN:  Active  Problems:   GI bleed 1. Acute posthemorrhagic  anemia, status post 4 units of packed cell blood cell transfusion , hemoglobin level is pending, transfuse as needed. Bleeding has stopped 2. Upper gastrointestinal bleed onto of unclear etiology at this time, patient has been on aspirin therapy, has been complaining of right upper quadrant epigastric abdominal discomfort for months now. Patient is to continue Protonix, IV drip, gastroenterology consultation is requested for possible EGD 3. Coagulopathy due to Coumadin, status post fresh frozen plasma and vitamin K administration, ProTime has improved. Follow ProTime INR tomorrow morning. Bleeding has stopped 4. Hypotension, patient is to continue transfusions and IV fluids as needed. Holding blood pressure medications 5. End-stage renal disease,  consultation from nephrologist is obtained, hemodialysis is tomorrow 6. Secondary hyperparathyroidism, resume sevelamer at home doses whenever able to take diet.   Management plans discussed with the patient, family and they are in agreement.   DRUG ALLERGIES: No Known Allergies  CODE STATUS:     Code Status Orders        Start     Ordered   01/04/16 1951  Full code   Continuous     01/04/16 1951    Code Status History    Date Active Date Inactive Code Status Order ID Comments User Context   This patient has a current code status but no historical code status.      TOTAL CRITICAL CARE TIME TAKING CARE OF THIS PATIENT: 45 minutes.    Katharina CaperVAICKUTE,[name] [name] M.D on 01/05/2016 at 12:48 PM  Between 7am to 6pm - Pager - [phone]  After 6pm go to [email or web address] - password [email or web address] [facility]  [facility]  Office  [phone]  CC: Primary care physician; Clydie BraunFITZGERALD, [name], MD

## 2016-01-05 NOTE — Progress Notes (Signed)
Primary nurse paged MD to notify of HGB of 5.8. Awaiting callback.

## 2016-01-05 NOTE — Consult Note (Signed)
Date: 01/05/2016                  Patient Name:  [name] [name]  MRN: [id number]  DOB: 1956/06/18  Age / Sex: 60 y.o., male         PCP: Clydie Braun, MD                 Service Requesting Consult:  internal medicine                  Reason for Consult:  ESRD, dialysis             History of Present Illness: Patient is a 60 y.o. male with medical problems of ESRD, dialysis dependent, hypertension, hyperlipidemia, diabetes, secondary hyperparathyroidism and atrial fibrillation, who was admitted to [facility] on 01/04/2016 for evaluation of fatigue. He states he has been noticing dark-colored stools off and on at home. He was found to have severe anemia with INR greater than 10 at the time of admission and hemoglobin of 4.9 He was given blood transfusion and is currently getting another one His last dialysis was yesterday    Medications: Outpatient medications: Prescriptions prior to admission  Medication Sig Dispense Refill Last Dose  . aspirin 81 MG chewable tablet Chew 81 mg by mouth every evening.   01/03/2016 at 1800  . b complex-vitamin c-folic acid (NEPHRO-VITE) 0.8 MG TABS tablet Take 1 tablet by mouth daily.   01/03/2016 at Unknown time  . carvedilol (COREG) 25 MG tablet Take 25 mg by mouth 2 (two) times daily as needed (depending on BP).    01/03/2016 at 1300  . diltiazem (DILACOR XR) 120 MG 24 hr capsule Take 120 mg by mouth 2 (two) times daily as needed (depending on BP).    01/03/2016 at Unknown time  . esomeprazole (NEXIUM) 40 MG capsule Take 40 mg by mouth daily.   01/03/2016 at Unknown time  . insulin aspart (NOVOLOG) 100 UNIT/ML injection Inject 0-20 Units into the skin 3 (three) times daily with meals as needed for high blood sugar. Pt uses per sliding scale.   Past Week at Unknown time  . sevelamer carbonate (RENVELA) 800 MG tablet Take 1,600-3,200 mg by mouth 5 (five) times daily. Pt takes four capsules three times a day with meals and two capsules two times a day with  snacks.   01/04/2016 at Unknown time  . simvastatin (ZOCOR) 20 MG tablet Take 20 mg by mouth at bedtime.    01/03/2016 at Unknown time  . warfarin (COUMADIN) 4 MG tablet Take 4 mg by mouth every evening.   01/03/2016 at 1500  . traMADol (ULTRAM) 50 MG tablet Take 1 tablet (50 mg total) by mouth every 6 (six) hours as needed. (Patient not taking: Reported on 11/29/2015) 9 tablet 0 Not Taking at Unknown time    Current medications: Current Facility-Administered Medications  Medication Dose Route Frequency Provider Last Rate Last Dose  . 0.9 %  sodium chloride infusion  10 mL/hr Intravenous Once Emily Filbert, MD   10 mL/hr at 01/04/16 1815  . acetaminophen (TYLENOL) tablet 650 mg  650 mg Oral Q6H PRN Houston Siren, MD       Or  . acetaminophen (TYLENOL) suppository 650 mg  650 mg Rectal Q6H PRN Houston Siren, MD      . ondansetron (ZOFRAN) tablet 4 mg  4 mg Oral Q6H PRN Houston Siren, MD       Or  . ondansetron [facility])  injection 4 mg  4 mg Intravenous Q6H PRN Houston Siren, MD   4 mg at 01/04/16 2354  . pantoprazole (PROTONIX) 80 mg in sodium chloride 0.9 % 100 mL IVPB  80 mg Intravenous Once Gayla Doss, MD   80 mg at 01/04/16 1815  . pantoprazole (PROTONIX) 80 mg in sodium chloride 0.9 % 250 mL (0.32 mg/mL) infusion  8 mg/hr Intravenous Continuous Gayla Doss, MD 25 mL/hr at 01/04/16 1833 8 mg/hr at 01/04/16 1833  . simvastatin (ZOCOR) tablet 20 mg  20 mg Oral QHS Houston Siren, MD   20 mg at 01/04/16 2306      Allergies: No Known Allergies    Past Medical History: Past Medical History  Diagnosis Date  . Renal disorder   . Diabetes mellitus without complication (HCC)   . Hypertension   . Hyperlipidemia   . ESRD (end stage renal disease) (HCC)   . Secondary hyperparathyroidism (HCC)   . GERD (gastroesophageal reflux disease)      Past Surgical History: Past Surgical History  Procedure Laterality Date  . Peripheral vascular catheterization N/A 11/29/2015     Procedure: A/V Shuntogram/Fistulagram;  Surgeon: [name], MD;  Location: [facility] INVASIVE CV LAB;  Service: Cardiovascular;  Laterality: N/A;  . Peripheral vascular catheterization N/A 11/29/2015    Procedure: A/V Shunt Intervention;  Surgeon: [name], MD;  Location: [facility] INVASIVE CV LAB;  Service: Cardiovascular;  Laterality: N/A;     Family History: Family History  Problem Relation Age of Onset  . Lung cancer Father   . Heart attack Mother      Social History: Social History   Social History  . Marital Status: Single    Spouse Name: N/A  . Number of Children: N/A  . Years of Education: N/A   Occupational History  . Not on file.   Social History Main Topics  . Smoking status: Never Smoker   . Smokeless tobacco: Not on file  . Alcohol Use: No  . Drug Use: No  . Sexual Activity: Not on file   Other Topics Concern  . Not on file   Social History Narrative     Review of Systems: Gen: No weight Changes, no fevers or chills HEENT: Denies any complaints CV: No chest pain Resp: No complaints of cough or acute shortness of breath GI: Reports dark stools for the past several weeks GU : No complaints MS: No complaints Derm:  No complaints Psych: No complaints Heme: No complaints Neuro: No complaints Endocrine no complaints  Vital Signs: Blood pressure 98/66, pulse 99, temperature 98.9 F (37.2 C), temperature source Oral, resp. rate 17, height 6' (1.829 m), weight 101.424 kg (223 lb 9.6 oz), SpO2 99 %.   Intake/Output Summary (Last 24 hours) at 01/05/16 1206 Last data filed at 01/05/16 1000  Gross per 24 hour  Intake 1018.25 ml  Output      0 ml  Net 1018.25 ml    Weight trends: Filed Weights   01/04/16 1446 01/04/16 1938  Weight: 106.595 kg (235 lb) 101.424 kg (223 lb 9.6 oz)    Physical Exam: General:  no acute distress, laying in the bed   HEENT  moist oral mucous membranes   Neck:  supple   Lungs:  normal respiratory effort,  clear to auscultation   Heart::  no rub or gallop   Abdomen:  soft, nontender   Extremities:  no peripheral edema   Neurologic:  alert, oriented  Skin:  no acute rashes   Access:  AVF  Foley:        Lab results: Basic Metabolic Panel:  Recent Labs Lab 01/04/16 1508 01/05/16 0416  NA 141 142  K 3.7 4.2  CL 96* 100*  CO2 31 31  GLUCOSE 171* 132*  BUN 54* 66*  CREATININE 6.85* 7.98*  CALCIUM 8.6* 8.7*    Liver Function Tests: No results for input(s): AST, ALT, ALKPHOS, BILITOT, PROT, ALBUMIN in the last 168 hours. No results for input(s): LIPASE, AMYLASE in the last 168 hours. No results for input(s): AMMONIA in the last 168 hours.  CBC:  Recent Labs Lab 01/04/16 1508 01/05/16 0416  WBC 9.6 9.0  HGB 5.0* 5.8*  HCT 15.5* 17.5*  MCV 93.6 92.1  PLT 197 153    Cardiac Enzymes: No results for input(s): CKTOTAL, TROPONINI in the last 168 hours.  BNP: Invalid input(s): POCBNP  CBG:  Recent Labs Lab 01/04/16 1726  GLUCAP 136*    Microbiology: No results found for this or any previous visit (from the past 720 hour(s)).   Coagulation Studies:  Recent Labs  01/04/16 0924 01/04/16 1512 01/05/16 0416  LABPROT >90.0* >90.0* 47.3*  INR >10.00* >10.00* 5.35*    Urinalysis: No results for input(s): COLORURINE, LABSPEC, PHURINE, GLUCOSEU, HGBUR, BILIRUBINUR, KETONESUR, PROTEINUR, UROBILINOGEN, NITRITE, LEUKOCYTESUR in the last 72 hours.  Invalid input(s): APPERANCEUR      Imaging:  No results found.   Assessment & Plan: Pt is a 60 y.o. yo male with a PMHX of ESRD, hypertension, hyperlipidemia, diabetes, atrial fibrillation, was admitted on 01/04/2016 with supratherapeutic INR, dark stools and severe anemia.   1. End-stage renal disease. [address] [facility]. Tuesday/Thursday/Saturday. [facility] - We will schedule his routine dialysis tomorrow  2. Severe anemia - chronic kidney disease and GI bleed - Likely secondary to GI bleed  from supratherapeutic INR - We will start patient on high-dose Procrit  3. Secondary hyperparathyroidism - May resume sevelamer at home dose once able to take Normal diet

## 2016-01-06 ENCOUNTER — Encounter: Payer: Self-pay | Admitting: Certified Registered Nurse Anesthetist

## 2016-01-06 ENCOUNTER — Encounter
Admission: EM | Disposition: A | Discharge status: Home / Self Care | Payer: Self-pay | Source: Home / Self Care | Attending: Internal Medicine

## 2016-01-06 DIAGNOSIS — I4891 Unspecified atrial fibrillation: Secondary | ICD-10-CM

## 2016-01-06 LAB — TYPE AND SCREEN
ABO/RH(D): O POS
Antibody Screen: NEGATIVE
UNIT DIVISION: 0
UNIT DIVISION: 0
UNIT DIVISION: 0
Unit division: 0
Unit division: 0
Unit division: 0

## 2016-01-06 LAB — BASIC METABOLIC PANEL
ANION GAP: 16 — AB (ref 5–15)
BUN: 90 mg/dL — ABNORMAL HIGH (ref 6–20)
CALCIUM: 9.2 mg/dL (ref 8.9–10.3)
CO2: 26 mmol/L (ref 22–32)
CREATININE: 10.18 mg/dL — AB (ref 0.61–1.24)
Chloride: 98 mmol/L — ABNORMAL LOW (ref 101–111)
GFR, EST AFRICAN AMERICAN: 6 mL/min — AB (ref >60)
GFR, EST NON AFRICAN AMERICAN: 5 mL/min — AB (ref >60)
Glucose, Bld: 164 mg/dL — ABNORMAL HIGH (ref 65–99)
Potassium: 4.6 mmol/L (ref 3.5–5.1)
SODIUM: 140 mmol/L (ref 135–145)

## 2016-01-06 LAB — PROTIME-INR
INR: 1.58
Prothrombin Time: 18.9 seconds — ABNORMAL HIGH (ref 11.4–15.0)

## 2016-01-06 LAB — CBC
HEMATOCRIT: 24.8 % — AB (ref 40.0–52.0)
Hemoglobin: 8.3 g/dL — ABNORMAL LOW (ref 13.0–18.0)
MCH: 31.5 pg (ref 26.0–34.0)
MCHC: 33.4 g/dL (ref 32.0–36.0)
MCV: 94.2 fL (ref 80.0–100.0)
PLATELETS: 173 10*3/uL (ref 150–440)
RBC: 2.63 MIL/uL — ABNORMAL LOW (ref 4.40–5.90)
RDW: 16.8 % — AB (ref 11.5–14.5)
WBC: 10.8 10*3/uL — ABNORMAL HIGH (ref 3.8–10.6)

## 2016-01-06 LAB — PREPARE FRESH FROZEN PLASMA: Unit division: 0

## 2016-01-06 LAB — TROPONIN I
TROPONIN I: 0.04 ng/mL — AB (ref <0.031)
TROPONIN I: 0.04 ng/mL — AB (ref <0.031)

## 2016-01-06 LAB — GLUCOSE, CAPILLARY: GLUCOSE-CAPILLARY: 121 mg/dL — AB (ref 65–99)

## 2016-01-06 LAB — MAGNESIUM: MAGNESIUM: 2.2 mg/dL (ref 1.7–2.4)

## 2016-01-06 SURGERY — ESOPHAGOGASTRODUODENOSCOPY (EGD) WITH PROPOFOL
Anesthesia: General

## 2016-01-06 MED ORDER — AMIODARONE LOAD VIA INFUSION
150.0000 mg | Freq: Once | INTRAVENOUS | Status: AC
  Administered 2016-01-06: 150 mg via INTRAVENOUS
  Filled 2016-01-06: qty 83.34

## 2016-01-06 MED ORDER — PROMETHAZINE HCL 25 MG/ML IJ SOLN
12.5000 mg | INTRAMUSCULAR | Status: DC | PRN
  Administered 2016-01-06: 12.5 mg via INTRAVENOUS
  Filled 2016-01-06: qty 1

## 2016-01-06 MED ORDER — AMIODARONE LOAD VIA INFUSION
150.0000 mg | Freq: Once | INTRAVENOUS | Status: DC
  Filled 2016-01-06: qty 83.34

## 2016-01-06 MED ORDER — AMIODARONE HCL IN DEXTROSE 360-4.14 MG/200ML-% IV SOLN
30.0000 mg/h | INTRAVENOUS | Status: DC
  Administered 2016-01-06: 30 mg/h via INTRAVENOUS
  Filled 2016-01-06 (×4): qty 200

## 2016-01-06 MED ORDER — AMIODARONE HCL IN DEXTROSE 360-4.14 MG/200ML-% IV SOLN
60.0000 mg/h | INTRAVENOUS | Status: DC
  Filled 2016-01-06: qty 200

## 2016-01-06 MED ORDER — AMIODARONE HCL IN DEXTROSE 360-4.14 MG/200ML-% IV SOLN
30.0000 mg/h | INTRAVENOUS | Status: DC
  Filled 2016-01-06: qty 200

## 2016-01-06 MED ORDER — MAGNESIUM SULFATE 2 GM/50ML IV SOLN
2.0000 g | Freq: Once | INTRAVENOUS | Status: AC
Start: 2016-01-06 — End: 2016-01-06
  Administered 2016-01-06: 2 g via INTRAVENOUS
  Filled 2016-01-06: qty 50

## 2016-01-06 MED ORDER — LIDOCAINE HCL (CARDIAC) 20 MG/ML IV SOLN
100.0000 mg | Freq: Once | INTRAVENOUS | Status: AC
  Administered 2016-01-06: 100 mg via INTRAVENOUS

## 2016-01-06 MED ORDER — PANTOPRAZOLE SODIUM 40 MG IV SOLR
40.0000 mg | Freq: Two times a day (BID) | INTRAVENOUS | Status: DC
  Administered 2016-01-07 – 2016-01-09 (×5): 40 mg via INTRAVENOUS
  Filled 2016-01-06 (×5): qty 40

## 2016-01-06 MED ORDER — AMIODARONE HCL IN DEXTROSE 360-4.14 MG/200ML-% IV SOLN
60.0000 mg/h | INTRAVENOUS | Status: AC
  Administered 2016-01-06 (×2): 60 mg/h via INTRAVENOUS
  Filled 2016-01-06 (×2): qty 200

## 2016-01-06 MED ORDER — DILTIAZEM HCL 25 MG/5ML IV SOLN
INTRAVENOUS | Status: AC
  Administered 2016-01-06: 25 mg
  Filled 2016-01-06: qty 5

## 2016-01-06 NOTE — Progress Notes (Signed)
[facility]  [facility]   PATIENT NAME: [name] [name]    MR#:  [id number]  DATE OF BIRTH:  10/28/1956  SUBJECTIVE:  CHIEF COMPLAINT:   Chief Complaint  Patient presents with  . Fatigue   patient is a 60 year old male who presents to the hospital with complaints of right upper quadrant, epigastric abdominal pain and melena, also fatigue, exertional dyspnea. In emergency room, he was noted to be severely anemic with hemoglobin level of 4.9. Patient was transfused 4 units of packed blood cells after which his hemoglobin level improved Patient was also noted to be coagulopathic with INR more than 10, he was given fresh frozen plasma and vitamin K and his pro time improved. She was complaining of some nausea and was given Zofran and later Phenergan due to relentless nausea, initially was in sinus tachycardia and developed atrial fibrillation, RVR and then wide complex tachycardia with a rate of 130, he was given amiodarone, lidocaine, Cardizem, converted to A. fib then subsequently into normal sinus rhythm . He had no chest pains and his blood pressure remained relatively stable except of minimal hypotension with questionable V. tach episode. Patient's troponin is slightly elevated. Patient is transferred to ICU  Patient complained of upper, right sided abdominal pain, as well as nausea initially in the morning.  Review of Systems  Unable to perform ROS: critical illness  Gastrointestinal: Positive for abdominal pain.    VITAL SIGNS: Blood pressure 92/49, pulse 103, temperature 98.1 F (36.7 C), temperature source Oral, resp. rate 20, height 6' (1.829 m), weight 106.232 kg (234 lb 3.2 oz), SpO2 92 %.  PHYSICAL EXAMINATION:   GENERAL:  60 y.o.-year-old patient lying in the bed in moderate acute distress, somewhat slumped.  EYES: Pupils equal, round, reactive to light and accommodation. No scleral icterus. Extraocular muscles intact.  HEENT: Head atraumatic,  normocephalic. Oropharynx and nasopharynx clear.  NECK:  Supple, no jugular venous distention. No thyroid enlargement, no tenderness.  LUNGS: Diminished breath sounds bilaterally, no wheezing, rales,rhonchi or crepitation. No use of accessory muscles of respiration.  CARDIOVASCULAR: S1, S2 tachycardic , distant. No murmurs, rubs, or gallops.  ABDOMEN: Soft, some tenderness epigastric, right upper quadrant area but no rebound or guarding was noted, nondistended. Bowel sounds present. No organomegaly or mass.  EXTREMITIES: No pedal edema, cyanosis, or clubbing.  NEUROLOGIC: Cranial nerves II through XII are intact. Muscle strength 5/5 in all extremities. Sensation intact. Gait not checked.  PSYCHIATRIC: The patient is alert and oriented x 3.  SKIN: No obvious rash, lesion, or ulcer.   ORDERS/RESULTS REVIEWED:   CBC  Recent Labs Lab 01/04/16 0924 01/04/16 1508 01/05/16 0416 01/05/16 1906 01/06/16 0524  WBC  --  9.6 9.0  --  10.8*  HGB 4.9* 5.0* 5.8* 8.4* 8.3*  HCT  --  15.5* 17.5*  --  24.8*  PLT  --  197 153  --  173  MCV  --  93.6 92.1  --  94.2  MCH  --  30.6 30.7  --  31.5  MCHC  --  32.7 33.3  --  33.4  RDW  --  16.9* 15.9*  --  16.8*   ------------------------------------------------------------------------------------------------------------------  Chemistries   Recent Labs Lab 01/04/16 1508 01/05/16 0416 01/06/16 0531  NA 141 142  --   K 3.7 4.2  --   CL 96* 100*  --   CO2 31 31  --   GLUCOSE 171* 132*  --   BUN 54* 66*  --  CREATININE 6.85* 7.98*  --   CALCIUM 8.6* 8.7*  --   MG  --   --  2.2   ------------------------------------------------------------------------------------------------------------------ estimated creatinine clearance is 12.7 mL/min (by C-G formula based on Cr of 7.98). ------------------------------------------------------------------------------------------------------------------ No results for input(s): TSH, T4TOTAL, T3FREE, THYROIDAB  in the last 72 hours.  Invalid input(s): FREET3  Cardiac Enzymes  Recent Labs Lab 01/06/16 1141  TROPONINI 0.04*   ------------------------------------------------------------------------------------------------------------------ Invalid input(s): POCBNP ---------------------------------------------------------------------------------------------------------------  RADIOLOGY: No results found.  EKG:  Orders placed or performed during the hospital encounter of 01/04/16  . ED EKG  . ED EKG  . EKG 12-Lead  . EKG 12-Lead    ASSESSMENT AND PLAN:  Active Problems:   GI bleed   Acute upper GI bleed 1. Acute posthemorrhagic anemia, status post 4 units of packed cell blood cell transfusion , hemoglobin level is stable, transfuse as needed. Bleeding has stopped. Recheck hemoglobin level tomorrow morning 2. Upper gastrointestinal bleed onto of unclear etiology at this time, patient has been on aspirin therapy, has been complaining of right upper quadrant epigastric abdominal discomfort for months now. Patient is to continue Protonix IV drip, gastroenterology consultation is appreciated ,  EGD was planned for today morning, although patient developed V. tach episode 3. Coagulopathy due to Coumadin, status post fresh frozen plasma and vitamin K administration, ProTime has improved. Follow ProTime INR tomorrow morning. Bleeding has stopped 4. Hypotension, patient is to continue transfusions and IV fluids as needed. Holding blood pressure medications for now 5. End-stage renal disease,  consultation from nephrologist is obtained, hemodialysis was planned today 6. Secondary hyperparathyroidism, resume sevelamer at home doses whenever able to take diet. 7. A. fib, RVR , deteriorating to likely V. Tach, now on amiodarone infusion, continue amiodarone. Resume Coreg if patient's blood pressure remained stable 8 elevated troponin, likely demand ischemia. Follow troponins every 4 hours 3, cardiology  consultation is appreciated  Management plans discussed with the patient, family and they are in agreement.   DRUG ALLERGIES: No Known Allergies  CODE STATUS:     Code Status Orders        Start     Ordered   01/04/16 1951  Full code   Continuous     01/04/16 1951    Code Status History    Date Active Date Inactive Code Status Order ID Comments User Context   This patient has a current code status but no historical code status.      TOTAL CRITICAL CARE TIME TAKING CARE OF THIS PATIENT: 60 minutes.    Katharina Caper M.D on 01/06/2016 at 1:14 PM  Between 7am to 6pm - Pager - [phone]  After 6pm go to [email or web address] - password [email or web address] [facility]  [place] [facility]  Office  [phone]  CC: Primary care physician; Clydie Braun, MD

## 2016-01-06 NOTE — Care Management Important Message (Deleted)
Important Message  Patient Details  Name: [name] [name] MRN: [id number] Date of Birth: 02/26/1956   Medicare Important Message Given:  Yes Given in room 204;3nd notice   Berna BueCheryl Chala Gul, RN 01/06/2016, 8:45 AM

## 2016-01-06 NOTE — Consult Note (Signed)
[facility] Pulmonary Medicine Consultation      Name: [name] [name] MRN: [id number] DOB: 1956-06-08    ADMISSION DATE:  01/04/2016  CHIEF COMPLAINT:   SOB  HISTORY OF PRESENT ILLNESS  60 y.o. male with a known history of end-stage renal disease on hemodialysis -admitted for GIB, patient with acute transfer to ICU for acute arythmia  HR at 180, with high BP -patient lethargic unable to provide complete ROS.  Patient with afib with RVR Patient given lidocaine, amiodarone infusion and MG and cardizem Patient placed on 100% NRB mask     PAST MEDICAL HISTORY    :  Past Medical History  Diagnosis Date  . Renal disorder   . Diabetes mellitus without complication (HCC)   . Hypertension   . Hyperlipidemia   . ESRD (end stage renal disease) (HCC)   . Secondary hyperparathyroidism (HCC)   . GERD (gastroesophageal reflux disease)    Past Surgical History  Procedure Laterality Date  . Peripheral vascular catheterization N/A 11/29/2015    Procedure: A/V Shuntogram/Fistulagram;  Surgeon: [name], MD;  Location: [facility] INVASIVE CV LAB;  Service: Cardiovascular;  Laterality: N/A;  . Peripheral vascular catheterization N/A 11/29/2015    Procedure: A/V Shunt Intervention;  Surgeon: [name], MD;  Location: [facility] INVASIVE CV LAB;  Service: Cardiovascular;  Laterality: N/A;   Prior to Admission medications   Medication Sig Start Date End Date Taking? Authorizing Provider  aspirin 81 MG chewable tablet Chew 81 mg by mouth every evening.   Yes Historical Provider, MD  b complex-vitamin c-folic acid (NEPHRO-VITE) 0.8 MG TABS tablet Take 1 tablet by mouth daily.   Yes Historical Provider, MD  carvedilol (COREG) 25 MG tablet Take 25 mg by mouth 2 (two) times daily as needed (depending on BP).    Yes Historical Provider, MD  diltiazem (DILACOR XR) 120 MG 24 hr capsule Take 120 mg by mouth 2 (two) times daily as needed (depending on BP).    Yes Historical Provider, MD  esomeprazole  (NEXIUM) 40 MG capsule Take 40 mg by mouth daily.   Yes Historical Provider, MD  insulin aspart (NOVOLOG) 100 UNIT/ML injection Inject 0-20 Units into the skin 3 (three) times daily with meals as needed for high blood sugar. Pt uses per sliding scale.   Yes Historical Provider, MD  sevelamer carbonate (RENVELA) 800 MG tablet Take 1,600-3,200 mg by mouth 5 (five) times daily. Pt takes four capsules three times a day with meals and two capsules two times a day with snacks.   Yes Historical Provider, MD  simvastatin (ZOCOR) 20 MG tablet Take 20 mg by mouth at bedtime.    Yes Historical Provider, MD  warfarin (COUMADIN) 4 MG tablet Take 4 mg by mouth every evening.   Yes Historical Provider, MD  traMADol (ULTRAM) 50 MG tablet Take 1 tablet (50 mg total) by mouth every 6 (six) hours as needed. Patient not taking: Reported on 11/29/2015 09/08/15   Chinita Pester, FNP   No Known Allergies   FAMILY HISTORY   Family History  Problem Relation Age of Onset  . Lung cancer Father   . Heart attack Mother       SOCIAL HISTORY    reports that he has never smoked. He does not have any smokeless tobacco history on file. He reports that he does not drink alcohol or use illicit drugs.  Review of Systems  Unable to perform ROS: critical illness      VITAL SIGNS  Temp:  [98.1 F (36.7 C)-99 F (37.2 C)] 98.1 F (36.7 C) (01/13 0647) Pulse Rate:  [98-103] 103 (01/13 0647) Resp:  [16-24] 20 (01/13 0647) BP: (92-111)/(49-71) 92/49 mmHg (01/13 0647) SpO2:  [92 %-95 %] 92 % (01/13 0647) Weight:  [234 lb 3.2 oz (130.865 kg)] 234 lb 3.2 oz (106.232 kg) (01/13 0647) HEMODYNAMICS:   VENTILATOR SETTINGS:   INTAKE / OUTPUT:  Intake/Output Summary (Last 24 hours) at 01/06/16 1121 Last data filed at 01/06/16 0805  Gross per 24 hour  Intake   1714 ml  Output      0 ml  Net   1714 ml       PHYSICAL EXAM   Physical Exam  Constitutional: He appears well-developed and well-nourished. No  distress.  HENT:  Head: Normocephalic and atraumatic.  Mouth/Throat: No oropharyngeal exudate.  Eyes: EOM are normal. Pupils are equal, round, and reactive to light.  Neck: Normal range of motion. Neck supple.  Cardiovascular: Normal rate, regular rhythm and normal heart sounds.   No murmur heard. Pulmonary/Chest: No stridor. No respiratory distress. He has no wheezes. He has no rales.  Abdominal: Soft. Bowel sounds are normal.  Musculoskeletal: Normal range of motion. He exhibits no edema.  Neurological:  lethargic  Skin: Skin is warm. He is diaphoretic.  Psychiatric: He has a normal mood and affect.       LABS   LABS:  CBC  Recent Labs Lab 01/04/16 1508 01/05/16 0416 01/05/16 1906 01/06/16 0524  WBC 9.6 9.0  --  10.8*  HGB 5.0* 5.8* 8.4* 8.3*  HCT 15.5* 17.5*  --  24.8*  PLT 197 153  --  173   Coag's  Recent Labs Lab 01/04/16 1512 01/05/16 0416 01/06/16 0524  APTT 100*  --   --   INR >10.00* 5.35* 1.58   BMET  Recent Labs Lab 01/04/16 1508 01/05/16 0416  NA 141 142  K 3.7 4.2  CL 96* 100*  CO2 31 31  BUN 54* 66*  CREATININE 6.85* 7.98*  GLUCOSE 171* 132*   Electrolytes  Recent Labs Lab 01/04/16 1508 01/05/16 0416 01/06/16 0531  CALCIUM 8.6* 8.7*  --   MG  --   --  2.2   Sepsis Markers No results for input(s): LATICACIDVEN, PROCALCITON, O2SATVEN in the last 168 hours. ABG No results for input(s): PHART, PCO2ART, PO2ART in the last 168 hours. Liver Enzymes No results for input(s): AST, ALT, ALKPHOS, BILITOT, ALBUMIN in the last 168 hours. Cardiac Enzymes No results for input(s): TROPONINI, PROBNP in the last 168 hours. Glucose  Recent Labs Lab 01/04/16 1726  GLUCAP 136*     No results found for this or any previous visit (from the past 240 hour(s)).   Current facility-administered medications:  .  0.9 %  sodium chloride infusion, 10 mL/hr, Intravenous, Once, Emily Filbert, MD, 10 mL/hr at 01/04/16 1815 .  acetaminophen  (TYLENOL) tablet 650 mg, 650 mg, Oral, Q6H PRN **OR** acetaminophen (TYLENOL) suppository 650 mg, 650 mg, Rectal, Q6H PRN, Houston Siren, MD .  amiodarone (NEXTERONE) 1.8 mg/mL load via infusion 150 mg, 150 mg, Intravenous, Once, 150 mg at 01/06/16 1118 **FOLLOWED BY** amiodarone (NEXTERONE PREMIX) 360 MG/200ML (1.8 mg/mL) IV infusion, 60 mg/hr, Intravenous, Continuous, 0 mg/hr at 01/06/16 1119 **FOLLOWED BY** amiodarone (NEXTERONE PREMIX) 360 MG/200ML (1.8 mg/mL) IV infusion, 30 mg/hr, Intravenous, Continuous, Katharina Caper, MD, 0 mg/hr at 01/06/16 1700 .  [COMPLETED] amiodarone (NEXTERONE) 1.8 mg/mL load via infusion 150 mg, 150 mg, Intravenous,  Once, Stopped at 01/06/16 1117 **FOLLOWED BY** amiodarone (NEXTERONE PREMIX) 360 MG/200ML (1.8 mg/mL) IV infusion, 60 mg/hr, Intravenous, Continuous, Last Rate: 33.3 mL/hr at 01/06/16 1118, 60 mg/hr at 01/06/16 1118 **FOLLOWED BY** amiodarone (NEXTERONE PREMIX) 360 MG/200ML (1.8 mg/mL) IV infusion, 30 mg/hr, Intravenous, Continuous, Katharina Caperima Vaickute, MD .  epoetin alfa (EPOGEN,PROCRIT) injection 10,000 Units, 10,000 Units, Intravenous, Q M,W,F-HD, Harmeet Singh, MD .  ondansetron (ZOFRAN) tablet 4 mg, 4 mg, Oral, Q6H PRN **OR** ondansetron (ZOFRAN) injection 4 mg, 4 mg, Intravenous, Q6H PRN, Houston SirenVivek J Sainani, MD, 4 mg at 01/06/16 0606 .  pantoprazole (PROTONIX) 80 mg in sodium chloride 0.9 % 100 mL IVPB, 80 mg, Intravenous, Once, Gayla DossEryka A Gayle, MD, 80 mg at 01/04/16 1815 .  pantoprazole (PROTONIX) 80 mg in sodium chloride 0.9 % 250 mL (0.32 mg/mL) infusion, 8 mg/hr, Intravenous, Continuous, Gayla DossEryka A Gayle, MD, Last Rate: 25 mL/hr at 01/04/16 1833, 8 mg/hr at 01/04/16 1833 .  promethazine (PHENERGAN) injection 12.5 mg, 12.5 mg, Intravenous, Q4H PRN, Katharina Caperima Vaickute, MD, 12.5 mg at 01/06/16 1020 .  simvastatin (ZOCOR) tablet 20 mg, 20 mg, Oral, QHS, Houston SirenVivek J Sainani, MD, 20 mg at 01/04/16 2306  IMAGING    No results found.    MAJOR EVENTS/TEST RESULTS: Acute afib  with RVR     ASSESSMENT/PLAN  60 yo AAM with acute tachyarrhythmia likely afib with aberrency with underlying GIB with ESRD on HD  PULMONARY Oxygen as needed -high risk for intubation  CARDIOVASCULAR Acute afib with RVR -cardiology consult ASAP -amiodarone infusion stat  RENAL -Follow up HD as needed per nephrology  GASTROINTESTINAL Keep NPO for now  HEMATOLOGIC Follow CBC     The Patient requires high complexity decision making for assessment and support, frequent evaluation and titration of therapies, application of advanced monitoring technologies and extensive interpretation of multiple databases. Critical Care Time devoted to patient care services described in this note is 45 minutes.   Overall, patient is critically ill, prognosis is guarded. Patient at high risk for cardiac arrest and death.    Lucie LeatherKurian [name] Hortence Charter, M.D.  [facility]  Medical Director [facility] Medical Director [facility] Cardio-Pulmonary Department

## 2016-01-06 NOTE — Consult Note (Signed)
[facility]  CARDIOLOGY CONSULT NOTE  Patient ID: [name] [name] MRN: [id number] DOB/AGE: 59/03/1956 59 y.o.  Admit date: 01/04/2016 Referring Physician Dr. Winona LegatoVaickute Primary Physician   Primary Cardiologist Dr. 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  He had global hypokinesis.  He also underwent a functional study at that time which revealed ejection fraction of 23% with no reversibility with global hypoperfusion.  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  [facility] developing dark tarry stools and noted to be severely anemic.  He received 4 units of packed red blood cells.  He gets hemodialysis 3 times a week.  He was being scheduled for an EGD today to evaluate the possible etiology of his GI bleed when he became nauseated.  He received Phenergan.  He then developed atrial fibrillation followed by wide complex tachycardia.  He was treated with amiodarone and IV magnesium.  He was also given IV Cardizem.  He subsequently converted back to atrial fibrillation.  Subsequently converted back to normal sinus rhythm.  He was relatively hemodynamically stable throughout the event.  ROS Review of Systems - History obtained from chart review and the patient General ROS: positive for  - fatigue and malaise Respiratory ROS: positive for - shortness of breath Cardiovascular ROS: positive for - palpitations and rapid heart rate Gastrointestinal ROS: no abdominal pain, change in bowel habits, or black or bloody stools Musculoskeletal ROS: negative Neurological ROS: no TIA or stroke symptoms   Past Medical History  Diagnosis Date  . Renal disorder   . Diabetes mellitus without  complication (HCC)   . Hypertension   . Hyperlipidemia   . ESRD (end stage renal disease) (HCC)   . Secondary hyperparathyroidism (HCC)   . GERD (gastroesophageal reflux disease)     Family History  Problem Relation Age of Onset  . Lung cancer Father   . Heart attack Mother     Social History   Social History  . Marital Status: Single    Spouse Name: N/A  . Number of Children: N/A  . Years of Education: N/A   Occupational History  . Not on file.   Social History Main Topics  . Smoking status: Never Smoker   . Smokeless tobacco: Not on file  . Alcohol Use: No  . Drug Use: No  . Sexual Activity: Not on file   Other Topics Concern  . Not on file   Social History Narrative    Past Surgical History  Procedure Laterality Date  . Peripheral vascular catheterization N/A 11/29/2015    Procedure: A/V Shuntogram/Fistulagram;  Surgeon: Renford DillsGregory G Schnier, MD;  Location: [facility] INVASIVE CV LAB;  Service: Cardiovascular;  Laterality: N/A;  . Peripheral vascular catheterization N/A 11/29/2015    Procedure: A/V Shunt Intervention;  Surgeon: Renford DillsGregory G Schnier, MD;  Location: [facility] INVASIVE CV LAB;  Service: Cardiovascular;  Laterality: N/A;     Prescriptions prior to admission  Medication Sig Dispense Refill Last Dose  . aspirin 81 MG chewable tablet Chew 81 mg by mouth every evening.   01/03/2016 at 1800  . b complex-vitamin c-folic acid (NEPHRO-VITE) 0.8 MG TABS tablet Take 1 tablet by mouth daily.   01/03/2016 at Unknown time  . carvedilol (COREG) 25  MG tablet Take 25 mg by mouth 2 (two) times daily as needed (depending on BP).    01/03/2016 at 1300  . diltiazem (DILACOR XR) 120 MG 24 hr capsule Take 120 mg by mouth 2 (two) times daily as needed (depending on BP).    01/03/2016 at Unknown time  . esomeprazole (NEXIUM) 40 MG capsule Take 40 mg by mouth daily.   01/03/2016 at Unknown time  . insulin aspart (NOVOLOG) 100 UNIT/ML injection Inject 0-20 Units into the skin 3 (three) times daily  with meals as needed for high blood sugar. Pt uses per sliding scale.   Past Week at Unknown time  . sevelamer carbonate (RENVELA) 800 MG tablet Take 1,600-3,200 mg by mouth 5 (five) times daily. Pt takes four capsules three times a day with meals and two capsules two times a day with snacks.   01/04/2016 at Unknown time  . simvastatin (ZOCOR) 20 MG tablet Take 20 mg by mouth at bedtime.    01/03/2016 at Unknown time  . warfarin (COUMADIN) 4 MG tablet Take 4 mg by mouth every evening.   01/03/2016 at 1500  . traMADol (ULTRAM) 50 MG tablet Take 1 tablet (50 mg total) by mouth every 6 (six) hours as needed. (Patient not taking: Reported on 11/29/2015) 9 tablet 0 Not Taking at Unknown time    Physical Exam: Blood pressure 92/49, pulse 103, temperature 98.1 F (36.7 C), temperature source Oral, resp. rate 20, height 6' (1.829 m), weight 106.232 kg (234 lb 3.2 oz), SpO2 92 %.    General appearance: alert and cooperative Resp: diminished breath sounds bilaterally Cardio: prominent apical impulse and regular rate and rhythm GI: soft, non-tender; bowel sounds normal; no masses,  no organomegaly Extremities: extremities normal, atraumatic, no cyanosis or edema Neurologic: Grossly normal Labs:   Lab Results  Component Value Date   WBC 10.8* 01/06/2016   HGB 8.3* 01/06/2016   HCT 24.8* 01/06/2016   MCV 94.2 01/06/2016   PLT 173 01/06/2016    Recent Labs Lab 01/05/16 0416  NA 142  K 4.2  CL 100*  CO2 31  BUN 66*  CREATININE 7.98*  CALCIUM 8.7*  GLUCOSE 132*   Lab Results  Component Value Date   CKTOTAL 43 07/05/2012   CKMB < 0.5* 07/05/2012   TROPONINI 0.04* 01/06/2016     EKG:  Sinus tachycardia initially followed by afib rapid vr with wide complex. Afib with abearancy vs vt. 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  He had global hypokinesis.  He also underwent a functional study at that time which revealed ejection fraction of 23% with no reversibility with global hypoperfusion.  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  He was admitted to [facility] after developing dark tarry stools and noted to be severely anemic.  He received 4 units of packed red blood cells.  He gets hemodialysis 3 times a week.  He was being scheduled for an EGD today to evaluate the possible etiology of his GI bleed when he became nauseated.  He received Phenergan.  He then developed atrial fibrillation followed by wide complex tachycardia.  He was treated with amiodarone and IV magnesium.  He was also given IV Cardizem.  He subsequently converted back to atrial fibrillation.  Subsequently converted back to  normal sinus rhythm.  He was relatively hemodynamically stable throughout the event. Signed: Dalia Heading MD, [facility] 01/06/2016, 12:30 PM

## 2016-01-06 NOTE — Progress Notes (Signed)
Paged and spoke with Dr. Thedore MinsSingh in regards to pt condition and cancel of EGD that was scheduled today. Dr. Thedore MinsSingh stated he would check staffing for dialysis and determine if patient needed to have dialysis today. Dr. Thedore MinsSingh updated on events that transpired to have patient transferred to ICU.

## 2016-01-06 NOTE — H&P (Deleted)
Rapid Response Event Note  Overview:  Rapid response called at 1045. 10 min after administration of 12.5 mg of phenergan, pt went into sustained V-tach. Pt transferred to ICU bed 18.  Sats increased to 96-100%. Cardiology (Dr. Lady GaryFath) notified. Pt converted to NSR approx. 1130.     Initial Focused Assessment: Pt arousable and appropriate. Had palpable pulse on admission to ICU and maintained throughout event.   Interventions: Pads placed on patient and connected to Zoll as well as monitor. Pt remained arousable and alert. Pt maintained pulse and blood pressure. Dr. Belia HemanKasa at bedside. Orders received to start amiodarone bolus and gtt, Cardizem bolus, lidocaine bolus, and magnesium bolus (see eMAR). Pt sats 88-89% on 6L during sustained V-Tach. Placed on 100% NRB.  Event Summary:   at      at          [facility] Harmes

## 2016-01-06 NOTE — Progress Notes (Addendum)
Subjective:  Overall doing fair Awaiting EGD today No acute c/o   Objective:  Vital signs in last 24 hours:  Temp:  [98.1 F (36.7 C)-99 F (37.2 C)] 98.1 F (36.7 C) (01/13 0647) Pulse Rate:  [98-103] 103 (01/13 0647) Resp:  [16-24] 20 (01/13 0647) BP: (92-111)/(49-71) 92/49 mmHg (01/13 0647) SpO2:  [92 %-95 %] 92 % (01/13 0647) Weight:  [106.232 kg (234 lb 3.2 oz)] 106.232 kg (234 lb 3.2 oz) (01/13 0647)  Weight change: -0.363 kg (-12.8 oz) Filed Weights   01/04/16 1446 01/04/16 1938 01/06/16 0647  Weight: 106.595 kg (235 lb) 101.424 kg (223 lb 9.6 oz) 106.232 kg (234 lb 3.2 oz)    Intake/Output:    Intake/Output Summary (Last 24 hours) at 01/06/16 1118 Last data filed at 01/06/16 0805  Gross per 24 hour  Intake   1714 ml  Output      0 ml  Net   1714 ml     Physical Exam: General: NAD  HEENT Moist oral mucus membranes  Neck supple  Pulm/lungs Clear to auscultation, normal effort  CVS/Heart iregular, no rub  Abdomen:  Soft, NT  Extremities: no edema  Neurologic: Alert, oreinted  Skin: No acute rash  Access: AVF       Basic Metabolic Panel:   Recent Labs Lab 01/04/16 1508 01/05/16 0416 01/06/16 0531  NA 141 142  --   K 3.7 4.2  --   CL 96* 100*  --   CO2 31 31  --   GLUCOSE 171* 132*  --   BUN 54* 66*  --   CREATININE 6.85* 7.98*  --   CALCIUM 8.6* 8.7*  --   MG  --   --  2.2     CBC:  Recent Labs Lab 01/04/16 0924 01/04/16 1508 01/05/16 0416 01/05/16 1906 01/06/16 0524  WBC  --  9.6 9.0  --  10.8*  HGB 4.9* 5.0* 5.8* 8.4* 8.3*  HCT  --  15.5* 17.5*  --  24.8*  MCV  --  93.6 92.1  --  94.2  PLT  --  197 153  --  173      Microbiology:  No results found for this or any previous visit (from the past 720 hour(s)).  Coagulation Studies:  Recent Labs  01/04/16 0924 01/04/16 1512 01/05/16 0416 01/06/16 0524  LABPROT >90.0* >90.0* 47.3* 18.9*  INR >10.00* >10.00* 5.35* 1.58    Urinalysis: No results for input(s):  COLORURINE, LABSPEC, PHURINE, GLUCOSEU, HGBUR, BILIRUBINUR, KETONESUR, PROTEINUR, UROBILINOGEN, NITRITE, LEUKOCYTESUR in the last 72 hours.  Invalid input(s): APPERANCEUR    Imaging: No results found.   Medications:   . amiodarone     Followed by  . amiodarone    . amiodarone     Followed by  . amiodarone    . pantoprozole (PROTONIX) infusion 8 mg/hr (01/04/16 1833)   . sodium chloride  10 mL/hr Intravenous Once  . amiodarone  150 mg Intravenous Once  . epoetin (EPOGEN/PROCRIT) injection  10,000 Units Intravenous Q M,W,F-HD  . pantoprazole (PROTONIX) IV  80 mg Intravenous Once  . simvastatin  20 mg Oral QHS   acetaminophen **OR** acetaminophen, ondansetron **OR** ondansetron (ZOFRAN) IV, promethazine  Assessment/ Plan:  60 y.o. male with a PMHX of ESRD, hypertension, hyperlipidemia, diabetes, atrial fibrillation, was admitted on 01/04/2016 with supratherapeutic INR, dark stools and severe anemia.   1. End-stage renal disease. [facility]. Tuesday/Thursday/Saturday[facility] - We will schedule his routine dialysis  tomorrow to allow for GI procedures today  2. Severe anemia - chronic kidney disease and GI bleed - Likely secondary to GI bleed from supratherapeutic INR - We will start patient on high-dose Procrit - upper endoscopy scheduled by Dr Servando Snare  3. Secondary hyperparathyroidism - May resume sevelamer at home dose once able to take Normal diet   LOS: 2 Aprel Egelhoff 1/13/201711:18 AM

## 2016-01-06 NOTE — Progress Notes (Addendum)
Rapid Response Event Note  Overview:  Rapid response called at 1045. 10 min after administration of 12.5 mg of phenergan, pt went into sustained V-tach. Pt transferred to ICU bed 18.  Sats increased to 96-100%. Cardiology (Dr. Lady GaryFath) notified. Pt converted to NSR approx. 1130.     Initial Focused Assessment: Pt arousable and appropriate. Had palpable pulse on admission to ICU and maintained throughout event.   Interventions: Pads placed on patient and connected to Zoll as well as monitor. Pt remained arousable and alert. Pt maintained pulse and blood pressure. Dr. Belia HemanKasa at bedside. Orders received to start amiodarone bolus and gtt, Cardizem bolus, lidocaine bolus, and magnesium bolus (see eMAR). Pt sats 88-89% on 6L during sustained V-Tach. Placed on 100% NRB.  Event Summary: Name of Physician Notified: Dr. Winona LegatoVaickute  at [phone]  Name of Consulting Physician Notified: Dr Belia HemanKasa at [phone]  Outcome: Transferred (Comment)  Event End Time: 0930  [name]

## 2016-01-06 NOTE — Care Management Important Message (Signed)
Important Message  Patient Details  Name: [name] [name] MRN: [id number] Date of Birth: 09/04/1956   Medicare Important Message Given:  Yes Notice given to pt. In room [address]    Maxamillion Banas, RN 01/06/2016, 8:46 AM

## 2016-01-06 NOTE — Progress Notes (Signed)
CRITICAL VALUE ALERT  Critical value received:  Positive Troponin of 0.04  Date of notification:  01/06/2016  Time of notification:  1224  Critical value read back:Yes.    Nurse who received alert:  Derald MacleodBritney Jamear Carbonneau, RN  MD notified (1st page):  Dr. Lady GaryFath  Time of first page:  1227  MD notified (2nd page):  Time of second page:  Responding MD:  Dr. Lady GaryFath  Time MD responded:  1228  Expected outcome. No new orders received.

## 2016-01-06 NOTE — Significant Event (Signed)
Rapid Response Event Note  Overview:  [facility].  Upon my arrival patient is alert and oriented, but feeling "sick to stomach".  BP normotensive, palpable peripheral pulses, and 93% on 6LNC.  Patient transferred stat to ICU for amio load and infusion.  IV lidocaine, magnesium, and cardizem also given.  12-lead ekg obtained as well as labs.  As rate slowed, rhythm noted was afib, therefore patient was given cardizem.   Stat cardiology consult obtained.  See CHL assessment and orders for further documentation.  Initial Focused Assessment:   Interventions:   Event Summary: Name of Physician Notified: Dr. Winona LegatoVaickute  at [phone]  Name of Consulting Physician Notified: Dr Belia HemanKasa at [phone]  Outcome: Transferred (Comment)  Event End Time: 0930  Jazlen Ogarro P

## 2016-01-07 DIAGNOSIS — R000 Tachycardia, unspecified: Secondary | ICD-10-CM

## 2016-01-07 DIAGNOSIS — N185 Chronic kidney disease, stage 5: Secondary | ICD-10-CM

## 2016-01-07 LAB — BASIC METABOLIC PANEL
Anion gap: 18 — ABNORMAL HIGH (ref 5–15)
BUN: 95 mg/dL — AB (ref 6–20)
CHLORIDE: 99 mmol/L — AB (ref 101–111)
CO2: 23 mmol/L (ref 22–32)
CREATININE: 11.07 mg/dL — AB (ref 0.61–1.24)
Calcium: 8.8 mg/dL — ABNORMAL LOW (ref 8.9–10.3)
GFR calc Af Amer: 5 mL/min — ABNORMAL LOW (ref >60)
GFR calc non Af Amer: 4 mL/min — ABNORMAL LOW (ref >60)
Glucose, Bld: 108 mg/dL — ABNORMAL HIGH (ref 65–99)
POTASSIUM: 4.6 mmol/L (ref 3.5–5.1)
SODIUM: 140 mmol/L (ref 135–145)

## 2016-01-07 LAB — CBC
HCT: 26.7 % — ABNORMAL LOW (ref 40.0–52.0)
HEMOGLOBIN: 8.6 g/dL — AB (ref 13.0–18.0)
MCH: 30.8 pg (ref 26.0–34.0)
MCHC: 32.4 g/dL (ref 32.0–36.0)
MCV: 95 fL (ref 80.0–100.0)
Platelets: 164 10*3/uL (ref 150–440)
RBC: 2.81 MIL/uL — AB (ref 4.40–5.90)
RDW: 16.5 % — ABNORMAL HIGH (ref 11.5–14.5)
WBC: 12.7 10*3/uL — ABNORMAL HIGH (ref 3.8–10.6)

## 2016-01-07 LAB — TROPONIN I: TROPONIN I: 0.04 ng/mL — AB (ref <0.031)

## 2016-01-07 MED ORDER — PANTOPRAZOLE SODIUM 40 MG IV SOLR
40.0000 mg | Freq: Two times a day (BID) | INTRAVENOUS | Status: DC
  Administered 2016-01-07: 40 mg via INTRAVENOUS
  Filled 2016-01-07: qty 40

## 2016-01-07 MED ORDER — SEVELAMER CARBONATE 800 MG PO TABS
1600.0000 mg | ORAL_TABLET | ORAL | Status: DC
  Administered 2016-01-07 – 2016-01-08 (×2): 1600 mg via ORAL
  Filled 2016-01-07 (×4): qty 2

## 2016-01-07 MED ORDER — SEVELAMER CARBONATE 800 MG PO TABS: 1600.0000 mg | ORAL_TABLET | Freq: Two times a day (BID) | ORAL | Status: DC

## 2016-01-07 MED ORDER — SEVELAMER CARBONATE 800 MG PO TABS
3200.0000 mg | ORAL_TABLET | Freq: Three times a day (TID) | ORAL | Status: DC
  Administered 2016-01-08 (×2): 3200 mg via ORAL
  Filled 2016-01-07 (×4): qty 4

## 2016-01-07 MED ORDER — AMIODARONE HCL 200 MG PO TABS
400.0000 mg | ORAL_TABLET | Freq: Two times a day (BID) | ORAL | Status: DC
  Administered 2016-01-07 (×2): 400 mg via ORAL
  Filled 2016-01-07 (×2): qty 2

## 2016-01-07 NOTE — Consult Note (Signed)
Pt in CCU, feeling better, much less abd pain.  Hgb up to 8.6.  Tongue somewhat pink.  abd non tender, chest clear anterior fields.  Pt understands that his blood was too thin and he had cardiac problems requiring hold off on endoscopic procedures at this time.  No new complaints.

## 2016-01-07 NOTE — Progress Notes (Signed)
[facility] [place] at [facility]   PATIENT NAME: [name] [name]    MR#:  [id number]  DATE OF BIRTH:  07/20/1956  SUBJECTIVE:  CHIEF COMPLAINT:   Chief Complaint  Patient presents with  . Fatigue   patient is a 60 year old male who presents to the hospital with complaints of right upper quadrant, epigastric abdominal pain and melena, also fatigue, exertional dyspnea. In emergency room, he was noted to be severely anemic with hemoglobin level of 4.9. Patient was transfused 4 units of packed blood cells after which his hemoglobin level improved Patient was also noted to be coagulopathic with INR more than 10, he was given fresh frozen plasma and vitamin K and his pro time improved. The patient was complaining of nausea and was given Zofran and later Phenergan due to relentless nausea, initially was in sinus tachycardia and developed atrial fibrillation, RVR and then wide complex tachycardia with a rate of 130-170, he was given amiodarone, lidocaine, Cardizem, magnesium IV, converted to A. fib then subsequently into normal sinus rhythm . He had no chest pains and his blood pressure remained relatively stable except of minimal hypotension with V. tach episode. Patient's troponin was slightly elevated. Patient was transferred to ICU . Today he feels good, no pain, but admits of intermittent L sided CP with HD  Less  right sided abdominal pain with FLD today. Amiodarone is changed to PO today by cardiology. NO EGD is planned by GI.  Admits of cough, clear sputum.  Review of Systems  Constitutional: Negative for fever, chills and weight loss.  HENT: Negative for congestion.   Eyes: Negative for blurred vision and double vision.  Respiratory: Negative for cough, sputum production, shortness of breath and wheezing.   Cardiovascular: Positive for chest pain. Negative for palpitations, orthopnea, leg swelling and PND.  Gastrointestinal: Positive for abdominal pain. Negative for  nausea, vomiting, diarrhea, constipation and blood in stool.  Genitourinary: Negative for dysuria, urgency, frequency and hematuria.  Musculoskeletal: Negative for falls.  Neurological: Negative for dizziness, tremors, focal weakness and headaches.  Endo/Heme/Allergies: Does not bruise/bleed easily.  Psychiatric/Behavioral: Negative for depression. The patient does not have insomnia.     VITAL SIGNS: Blood pressure 90/65, pulse 96, temperature 99.2 F (37.3 C), temperature source Axillary, resp. rate 20, height 6' (1.829 m), weight 106.232 kg (234 lb 3.2 oz), SpO2 96 %.  PHYSICAL EXAMINATION:   GENERAL:  60 y.o.-year-old patient lying in the bed in no distress, alert and comfortable.  EYES: Pupils equal, round, reactive to light and accommodation. No scleral icterus. Extraocular muscles intact.  HEENT: Head atraumatic, normocephalic. Oropharynx and nasopharynx clear.  NECK:  Supple, no jugular venous distention. No thyroid enlargement, no tenderness.  LUNGS: Diminished breath sounds bilaterally, no wheezing, rales,rhonchi or crepitation. No use of accessory muscles of respiration.  CARDIOVASCULAR: S1, S2 tachycardic , distant. No murmurs, rubs, or gallops.  ABDOMEN: Soft, minimal, if any tenderness epigastric, right upper quadrant area but no rebound or guarding was noted, nondistended. Bowel sounds present. No organomegaly or mass.  EXTREMITIES: No pedal edema, cyanosis, or clubbing.  NEUROLOGIC: Cranial nerves II through XII are intact. Muscle strength 5/5 in all extremities. Sensation intact. Gait not checked.  PSYCHIATRIC: The patient is alert and oriented x 3.  SKIN: No obvious rash, lesion, or ulcer.   ORDERS/RESULTS REVIEWED:   CBC  Recent Labs Lab 01/04/16 1508 01/05/16 0416 01/05/16 1906 01/06/16 0524 01/07/16 0500  WBC 9.6 9.0  --  10.8* 12.7*  HGB  5.0* 5.8* 8.4* 8.3* 8.6*  HCT 15.5* 17.5*  --  24.8* 26.7*  PLT 197 153  --  173 164  MCV 93.6 92.1  --  94.2 95.0  MCH  30.6 30.7  --  31.5 30.8  MCHC 32.7 33.3  --  33.4 32.4  RDW 16.9* 15.9*  --  16.8* 16.5*   ------------------------------------------------------------------------------------------------------------------  Chemistries   Recent Labs Lab 01/04/16 1508 01/05/16 0416 01/06/16 0531 01/06/16 1141 01/07/16 0020  NA 141 142  --  140 140  K 3.7 4.2  --  4.6 4.6  CL 96* 100*  --  98* 99*  CO2 31 31  --  26 23  GLUCOSE 171* 132*  --  164* 108*  BUN 54* 66*  --  90* 95*  CREATININE 6.85* 7.98*  --  10.18* 11.07*  CALCIUM 8.6* 8.7*  --  9.2 8.8*  MG  --   --  2.2  --   --    ------------------------------------------------------------------------------------------------------------------ estimated creatinine clearance is 9.2 mL/min (by C-G formula based on Cr of 11.07). ------------------------------------------------------------------------------------------------------------------ No results for input(s): TSH, T4TOTAL, T3FREE, THYROIDAB in the last 72 hours.  Invalid input(s): FREET3  Cardiac Enzymes  Recent Labs Lab 01/06/16 1141 01/06/16 1743 01/07/16 0020  TROPONINI 0.04* 0.04* 0.04*   ------------------------------------------------------------------------------------------------------------------ Invalid input(s): POCBNP ---------------------------------------------------------------------------------------------------------------  RADIOLOGY: No results found.  EKG:  Orders placed or performed during the hospital encounter of 01/04/16  . ED EKG  . ED EKG  . EKG 12-Lead  . EKG 12-Lead    ASSESSMENT AND PLAN:  Active Problems:   GI bleed   Acute upper GI bleed 1. Acute posthemorrhagic anemia, status post 4 units of packed cell blood cell transfusion , hemoglobin level is stable, transfuse as needed. Bleeding has stopped. Recheck hemoglobin level tomorrow morning, possible discharge home tomorrow vs on Monday on FLD 2. Upper gastrointestinal bleed onto of unclear  etiology at this time, patient has been on aspirin therapy, has been complaining of right upper quadrant epigastric abdominal discomfort for months now. Patient is to continue Protonix IV BID, may change to PO tomorrow , gastroenterology consultation is appreciated ,  EGD is going to be done as outpatient after cardiac work up.  3. Coagulopathy due to Coumadin, status post fresh frozen plasma and vitamin K administration, ProTime has improved.  Bleeding has stopped. Follow ProTime INR intermittently 4. Hypotension, stable, no IVF infusion due to cardiomyopathy. Holding blood pressure medications for now, may restart coreg upon  Dc home.  5. End-stage renal disease,  consultation from nephrologist is obtained, hemodialysis per nephrology.  6. Secondary hyperparathyroidism, resume sevelamer at home doses today.  7. A. fib, RVR , deteriorating to likely V. Tach, now on amiodarone orally.  Resume Coreg when patient's blood pressure is better 8 elevated troponin, likely demand ischemia. Cardiology consultation is appreciated, work up per them, may need AICD 9. Leukocytosis, get chest xray to r/o pneumonia, although not febrile.   Management plans discussed with the patient, family and they are in agreement.   DRUG ALLERGIES: No Known Allergies  CODE STATUS:     Code Status Orders        Start     Ordered   01/04/16 1951  Full code   Continuous     01/04/16 1951    Code Status History    Date Active Date Inactive Code Status Order ID Comments User Context   This patient has a current code status but no historical code status.  TOTAL CRITICAL CARE TIME TAKING CARE OF THIS PATIENT: 35 minutes.    Katharina Caper M.D on 01/07/2016 at 12:41 PM  Between 7am to 6pm - Pager - [phone]  After 6pm go to [email or web address] - password [email or web address] [facility]  [place] [facility]  Office  [phone]  CC: Primary care physician; Clydie Braun, MD

## 2016-01-07 NOTE — Progress Notes (Signed)
Subjective:  Overall doing fair Rapid response team was called yesterday because of lethargy after iv phenergan and arrythmias with HD in 180's (A fib with RVR) Given iv cardizem Currently on iv amiodarone  Objective:  Vital signs in last 24 hours:  Temp:  [97.8 F (36.6 C)-99.2 F (37.3 C)] 99.2 F (37.3 C) (01/14 0800) Pulse Rate:  [93-98] 96 (01/14 1100) Resp:  [16-29] 20 (01/14 1100) BP: (90-115)/(63-80) 90/65 mmHg (01/14 1100) SpO2:  [91 %-100 %] 96 % (01/14 1100)  Weight change:  Filed Weights   01/04/16 1446 01/04/16 1938 01/06/16 0647  Weight: 106.595 kg (235 lb) 101.424 kg (223 lb 9.6 oz) 106.232 kg (234 lb 3.2 oz)    Intake/Output:    Intake/Output Summary (Last 24 hours) at 01/07/16 1248 Last data filed at 01/07/16 1124  Gross per 24 hour  Intake 2082.07 ml  Output      0 ml  Net 2082.07 ml     Physical Exam: General: NAD  HEENT Moist oral mucus membranes  Neck supple  Pulm/lungs Clear to auscultation, normal effort  CVS/Heart iregular, no rub  Abdomen:  Soft, NT  Extremities: no edema  Neurologic: Alert, oreinted  Skin: No acute rash  Access: AVF       Basic Metabolic Panel:   Recent Labs Lab 01/04/16 1508 01/05/16 0416 01/06/16 0531 01/06/16 1141 01/07/16 0020  NA 141 142  --  140 140  K 3.7 4.2  --  4.6 4.6  CL 96* 100*  --  98* 99*  CO2 31 31  --  26 23  GLUCOSE 171* 132*  --  164* 108*  BUN 54* 66*  --  90* 95*  CREATININE 6.85* 7.98*  --  10.18* 11.07*  CALCIUM 8.6* 8.7*  --  9.2 8.8*  MG  --   --  2.2  --   --      CBC:  Recent Labs Lab 01/04/16 1508 01/05/16 0416 01/05/16 1906 01/06/16 0524 01/07/16 0500  WBC 9.6 9.0  --  10.8* 12.7*  HGB 5.0* 5.8* 8.4* 8.3* 8.6*  HCT 15.5* 17.5*  --  24.8* 26.7*  MCV 93.6 92.1  --  94.2 95.0  PLT 197 153  --  173 164      Microbiology:  No results found for this or any previous visit (from the past 720 hour(s)).  Coagulation Studies:  Recent Labs  01/04/16 1512  01/05/16 0416 01/06/16 0524  LABPROT >90.0* 47.3* 18.9*  INR >10.00* 5.35* 1.58    Urinalysis: No results for input(s): COLORURINE, LABSPEC, PHURINE, GLUCOSEU, HGBUR, BILIRUBINUR, KETONESUR, PROTEINUR, UROBILINOGEN, NITRITE, LEUKOCYTESUR in the last 72 hours.  Invalid input(s): APPERANCEUR    Imaging: No results found.   Medications:   . pantoprozole (PROTONIX) infusion Stopped (01/07/16 1224)   . amiodarone  400 mg Oral BID  . epoetin (EPOGEN/PROCRIT) injection  10,000 Units Intravenous Q M,W,F-HD  . pantoprazole (PROTONIX) IV  40 mg Intravenous Q12H  . pantoprazole (PROTONIX) IV  40 mg Intravenous Q12H  . simvastatin  20 mg Oral QHS   acetaminophen **OR** acetaminophen, ondansetron **OR** ondansetron (ZOFRAN) IV, promethazine  Assessment/ Plan:  60 y.o. male with a PMHX of ESRD, hypertension, hyperlipidemia, diabetes, atrial fibrillation, was admitted on 01/04/2016 with supratherapeutic INR, dark stools and severe anemia. 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 End-stage renal disease. [facility] dialysis. Tuesday/Thursday/Saturday. [facility] nephrology - HD today  2. Severe anemia - chronic kidney disease and GI bleed - anemia is secondary to GI bleed from supratherapeutic INR - continue high-dose Procrit - GI work up in progress  3. Secondary hyperparathyroidism - May resume sevelamer at home dose once able to take Normal diet   LOS: 3 [name] [name] 1/14/201712:48 PM

## 2016-01-07 NOTE — Progress Notes (Signed)
[facility]  SUBJECTIVE: No complaints this am. No further arrhythmias. Relatively hypotensive on iv amiodarone. Will d/c iv and place on po amiodarone   Filed Vitals:   01/07/16 0600 01/07/16 0700 01/07/16 0800 01/07/16 0859  BP: 106/73 99/66 95/68    Pulse: 97 98 97   Temp:   99.2 F (37.3 C)   TempSrc:   Axillary   Resp: 21 20 22 26   Height:      Weight:      SpO2: 100% 98% 100% 100%    Intake/Output Summary (Last 24 hours) at 01/07/16 0905 Last data filed at 01/07/16 0600  Gross per 24 hour  Intake 1436.91 ml  Output      0 ml  Net 1436.91 ml    LABS: Basic Metabolic Panel:  Recent Labs  08/65/7800/12/09 0416 01/06/16 0531 01/06/16 1141  NA 142  --  140  K 4.2  --  4.6  CL 100*  --  98*  CO2 31  --  26  GLUCOSE 132*  --  164*  BUN 66*  --  90*  CREATININE 7.98*  --  10.18*  CALCIUM 8.7*  --  9.2  MG  --  2.2  --    Liver Function Tests: No results for input(s): AST, ALT, ALKPHOS, BILITOT, PROT, ALBUMIN in the last 72 hours. No results for input(s): LIPASE, AMYLASE in the last 72 hours. CBC:  Recent Labs  01/06/16 0524 01/07/16 0500  WBC 10.8* 12.7*  HGB 8.3* 8.6*  HCT 24.8* 26.7*  MCV 94.2 95.0  PLT 173 164   Cardiac Enzymes:  Recent Labs  01/06/16 1141 01/06/16 1743 01/07/16 0020  TROPONINI 0.04* 0.04* 0.04*   BNP: Invalid input(s): POCBNP D-Dimer: No results for input(s): DDIMER in the last 72 hours. Hemoglobin A1C: No results for input(s): HGBA1C in the last 72 hours. Fasting Lipid Panel: No results for input(s): CHOL, HDL, LDLCALC, TRIG, CHOLHDL, LDLDIRECT in the last 72 hours. Thyroid Function Tests: No results for input(s): TSH, T4TOTAL, T3FREE, THYROIDAB in the last 72 hours.  Invalid input(s): FREET3 Anemia Panel: No results for input(s): VITAMINB12, FOLATE, FERRITIN, TIBC, IRON, RETICCTPCT in the last 72 hours.   Physical Exam: Blood pressure 95/68, pulse 97, temperature 99.2 F (37.3 C),  temperature source Axillary, resp. rate 26, height 6' (1.829 m), weight 106.232 kg (234 lb 3.2 oz), SpO2 100 %.    General appearance: alert and cooperative Resp: clear to auscultation bilaterally Cardio: regular rate and rhythm GI: soft, non-tender; bowel sounds normal; no masses,  no organomegaly Extremities: extremities normal, atraumatic, no cyanosis or edema Neurologic: Grossly normal  TELEMETRY: Reviewed telemetry pt in nsr:  ASSESSMENT AND PLAN:  Active Problems:   GI bleed-currently stable   Acute upper GI bleed  afib with abearant rvr vs vt-stable on iv amiodarone. WIll change to po amiodarone 400 bid. OK to transfer to telemetry after hd.   Dalia HeadingFATH,Latecia Miler A., MD, [facility] 01/07/2016 9:05 AM

## 2016-01-07 NOTE — Consult Note (Signed)
[facility]      Name: [name] [name] MRN: [id number] DOB: 05/19/1956    ADMISSION DATE:  01/04/2016  CHIEF COMPLAINT:   SOB  HISTORY OF PRESENT ILLNESS  60 y.o. male with a known history of end-stage renal disease on hemodialysis -admitted for GIB, patient with acute transfer to ICU for acute arythmia  HR at 180, with high BP -patient lethargic unable to provide complete ROS.  Patient with afib with RVR Patient given lidocaine, amiodarone infusion and MG and cardizem Patient placed on 100% NRB mask   SUBJECTIVE: Patient states he is doing well this morning, no complaints of palpitations, nausea, diaphoresis.   PAST MEDICAL HISTORY    :  Past Medical History  Diagnosis Date  . Renal disorder   . Diabetes mellitus without complication (HCC)   . Hypertension   . Hyperlipidemia   . ESRD (end stage renal disease) (HCC)   . Secondary hyperparathyroidism (HCC)   . GERD (gastroesophageal reflux disease)    Past Surgical History  Procedure Laterality Date  . Peripheral vascular catheterization N/A 11/29/2015    Procedure: A/V Shuntogram/Fistulagram;  Surgeon: Renford DillsGregory G Schnier, MD;  Location: [facility] INVASIVE CV LAB;  Service: Cardiovascular;  Laterality: N/A;  . Peripheral vascular catheterization N/A 11/29/2015    Procedure: A/V Shunt Intervention;  Surgeon: Renford DillsGregory G Schnier, MD;  Location: [facility] INVASIVE CV LAB;  Service: Cardiovascular;  Laterality: N/A;   Prior to Admission medications   Medication Sig Start Date End Date Taking? Authorizing Provider  aspirin 81 MG chewable tablet Chew 81 mg by mouth every evening.   Yes Historical Provider, MD  b complex-vitamin c-folic acid (NEPHRO-VITE) 0.8 MG TABS tablet Take 1 tablet by mouth daily.   Yes Historical Provider, MD  carvedilol (COREG) 25 MG tablet Take 25 mg by mouth 2 (two) times daily as needed (depending on BP).    Yes Historical Provider, MD  diltiazem (DILACOR XR) 120 MG 24 hr capsule  Take 120 mg by mouth 2 (two) times daily as needed (depending on BP).    Yes Historical Provider, MD  esomeprazole (NEXIUM) 40 MG capsule Take 40 mg by mouth daily.   Yes Historical Provider, MD  insulin aspart (NOVOLOG) 100 UNIT/ML injection Inject 0-20 Units into the skin 3 (three) times daily with meals as needed for high blood sugar. Pt uses per sliding scale.   Yes Historical Provider, MD  sevelamer carbonate (RENVELA) 800 MG tablet Take 1,600-3,200 mg by mouth 5 (five) times daily. Pt takes four capsules three times a day with meals and two capsules two times a day with snacks.   Yes Historical Provider, MD  simvastatin (ZOCOR) 20 MG tablet Take 20 mg by mouth at bedtime.    Yes Historical Provider, MD  warfarin (COUMADIN) 4 MG tablet Take 4 mg by mouth every evening.   Yes Historical Provider, MD  traMADol (ULTRAM) 50 MG tablet Take 1 tablet (50 mg total) by mouth every 6 (six) hours as needed. Patient not taking: Reported on 11/29/2015 09/08/15   Chinita Pesterari B Triplett, FNP   No Known Allergies   FAMILY HISTORY   Family History  Problem Relation Age of Onset  . Lung cancer Father   . Heart attack Mother       SOCIAL HISTORY    reports that he has never smoked. He does not have any smokeless tobacco history on file. He reports that he does not drink alcohol or use illicit drugs.  Review of Systems  Constitutional: Negative for fever, chills and malaise/fatigue.  HENT: Negative for congestion, ear pain and sore throat.   Eyes: Negative for double vision.  Respiratory: Negative for cough, hemoptysis, sputum production, shortness of breath and wheezing.   Cardiovascular: Negative for chest pain, palpitations and leg swelling.  Gastrointestinal: Negative for heartburn, nausea, vomiting and abdominal pain.  Genitourinary: Negative for dysuria.  Musculoskeletal: Negative for myalgias.  Skin: Negative for rash.  Neurological: Negative for dizziness and headaches.      VITAL SIGNS     Temp:  [97.8 F (36.6 C)-98.9 F (37.2 C)] 98.9 F (37.2 C) (01/14 0500) Pulse Rate:  [30-98] 97 (01/14 0800) Resp:  [8-26] 22 (01/14 0800) BP: (86-129)/(60-111) 95/68 mmHg (01/14 0800) SpO2:  [90 %-100 %] 100 % (01/14 0800) HEMODYNAMICS:   VENTILATOR SETTINGS:   INTAKE / OUTPUT:  Intake/Output Summary (Last 24 hours) at 01/07/16 0854 Last data filed at 01/07/16 0600  Gross per 24 hour  Intake 1436.91 ml  Output      0 ml  Net 1436.91 ml       PHYSICAL EXAM   Physical Exam  Constitutional: He is oriented to person, place, and time. He appears well-developed and well-nourished. No distress.  HENT:  Head: Normocephalic and atraumatic.  Mouth/Throat: No oropharyngeal exudate.  Eyes: EOM are normal. Pupils are equal, round, and reactive to light.  Neck: Normal range of motion. Neck supple.  Cardiovascular: Regular rhythm and normal heart sounds.   No murmur heard. Mild tachycardia, regular   Pulmonary/Chest: No stridor. No respiratory distress. He has no wheezes. He has no rales.  Abdominal: Soft. Bowel sounds are normal.  Musculoskeletal: Normal range of motion. He exhibits no edema.  Neurological: He is alert and oriented to person, place, and time.  lethargic  Skin: Skin is warm and dry.  Psychiatric: He has a normal mood and affect.       LABS   LABS:  CBC  Recent Labs Lab 01/05/16 0416 01/05/16 1906 01/06/16 0524 01/07/16 0500  WBC 9.0  --  10.8* 12.7*  HGB 5.8* 8.4* 8.3* 8.6*  HCT 17.5*  --  24.8* 26.7*  PLT 153  --  173 164   Coag's  Recent Labs Lab 01/04/16 1512 01/05/16 0416 01/06/16 0524  APTT 100*  --   --   INR >10.00* 5.35* 1.58   BMET  Recent Labs Lab 01/04/16 1508 01/05/16 0416 01/06/16 1141  NA 141 142 140  K 3.7 4.2 4.6  CL 96* 100* 98*  CO2 31 31 26   BUN 54* 66* 90*  CREATININE 6.85* 7.98* 10.18*  GLUCOSE 171* 132* 164*   Electrolytes  Recent Labs Lab 01/04/16 1508 01/05/16 0416 01/06/16 0531  01/06/16 1141  CALCIUM 8.6* 8.7*  --  9.2  MG  --   --  2.2  --    Sepsis Markers No results for input(s): LATICACIDVEN, PROCALCITON, O2SATVEN in the last 168 hours. ABG No results for input(s): PHART, PCO2ART, PO2ART in the last 168 hours. Liver Enzymes No results for input(s): AST, ALT, ALKPHOS, BILITOT, ALBUMIN in the last 168 hours. Cardiac Enzymes  Recent Labs Lab 01/06/16 1141 01/06/16 1743 01/07/16 0020  TROPONINI 0.04* 0.04* 0.04*   Glucose  Recent Labs Lab 01/04/16 1726 01/06/16 1548  GLUCAP 136* 121*     No results found for this or any previous visit (from the past 240 hour(s)).   Current facility-administered medications:  .  0.9 %  sodium chloride infusion, 10 mL/hr, Intravenous, Once,  Emily Filbert, MD, 10 mL/hr at 01/04/16 1815 .  acetaminophen (TYLENOL) tablet 650 mg, 650 mg, Oral, Q6H PRN **OR** acetaminophen (TYLENOL) suppository 650 mg, 650 mg, Rectal, Q6H PRN, Houston Siren, MD .  [address] amiodarone (NEXTERONE) 1.8 mg/mL load via infusion 150 mg, 150 mg, Intravenous, Once, Stopped at 01/06/16 1117 **FOLLOWED BY** [EXPIRED] amiodarone (NEXTERONE PREMIX) 360 MG/200ML (1.8 mg/mL) IV infusion, 60 mg/hr, Intravenous, Continuous, Stopped at 01/06/16 1719 **FOLLOWED BY** amiodarone (NEXTERONE PREMIX) 360 MG/200ML (1.8 mg/mL) IV infusion, 30 mg/hr, Intravenous, Continuous, Katharina Caper, MD, Last Rate: 16.7 mL/hr at 01/06/16 2223, 30 mg/hr at 01/06/16 2223 .  epoetin alfa (EPOGEN,PROCRIT) injection 10,000 Units, 10,000 Units, Intravenous, Q M,W,F-HD, Mosetta Pigeon, MD, 10,000 Units at 01/06/16 1409 .  ondansetron (ZOFRAN) tablet 4 mg, 4 mg, Oral, Q6H PRN **OR** ondansetron (ZOFRAN) injection 4 mg, 4 mg, Intravenous, Q6H PRN, Houston Siren, MD, 4 mg at 01/06/16 0606 .  pantoprazole (PROTONIX) 80 mg in sodium chloride 0.9 % 100 mL IVPB, 80 mg, Intravenous, Once, Gayla Doss, MD, 80 mg at 01/04/16 1815 .  pantoprazole (PROTONIX) 80 mg in sodium  chloride 0.9 % 250 mL (0.32 mg/mL) infusion, 8 mg/hr, Intravenous, Continuous, Gayla Doss, MD, Last Rate: 25 mL/hr at 01/07/16 0111, 8 mg/hr at 01/07/16 0111 .  pantoprazole (PROTONIX) injection 40 mg, 40 mg, Intravenous, Q12H, Katharina Caper, MD .  promethazine (PHENERGAN) injection 12.5 mg, 12.5 mg, Intravenous, Q4H PRN, Katharina Caper, MD, 12.5 mg at 01/06/16 1020 .  simvastatin (ZOCOR) tablet 20 mg, 20 mg, Oral, QHS, Houston Siren, MD, 20 mg at 01/06/16 2137  IMAGING    No results found.    MAJOR EVENTS/TEST RESULTS: Acute afib with RVR     ASSESSMENT/PLAN  60 yo AAM with acute tachyarrhythmia likely afib with aberrency with underlying GIB with ESRD on HD  PULMONARY Oxygen as needed Maintain oxygen saturations greater than 90%  CARDIOVASCULAR - stable now Acute afib with RVR - stable - Currently on amiodarone infusion planning to switch to oral meds - cardiology following  RENAL -Follow up HD as needed per nephrology  GASTROINTESTINAL -PPI  HEMATOLOGIC Follow CBC  Patient currently doesn't require further ICU needs since his tachyarrhythmia is stable. He can be transferred to stepdown unit and telemetry floor.  Thank you for consulting  Pulmonary and Critical Care, we will signoff at this time.  Please feel free to contact us with any questions at [phone] (please enter 7-digits).   Critical Care consult time - 35 mins   Stephanie Acre, MD  Pulmonary and Critical Care Pager [phone] (please enter 7-digits) On Call Pager - [phone] (please enter 7-digits)

## 2016-01-08 ENCOUNTER — Inpatient Hospital Stay: Payer: Medicare Other

## 2016-01-08 MED ORDER — AMIODARONE HCL IN DEXTROSE 360-4.14 MG/200ML-% IV SOLN
30.0000 mg/h | INTRAVENOUS | Status: DC
  Administered 2016-01-08 – 2016-01-09 (×3): 30 mg/h via INTRAVENOUS
  Filled 2016-01-08 (×3): qty 200

## 2016-01-08 MED ORDER — CETYLPYRIDINIUM CHLORIDE 0.05 % MT LIQD
7.0000 mL | Freq: Two times a day (BID) | OROMUCOSAL | Status: DC
  Administered 2016-01-08 (×2): 7 mL via OROMUCOSAL

## 2016-01-08 MED ORDER — HYDROCOD POLST-CPM POLST ER 10-8 MG/5ML PO SUER: 5.0000 mL | Freq: Two times a day (BID) | ORAL | Status: DC | PRN

## 2016-01-08 MED ORDER — CHLORHEXIDINE GLUCONATE 0.12 % MT SOLN
15.0000 mL | Freq: Two times a day (BID) | OROMUCOSAL | Status: DC
  Administered 2016-01-08 – 2016-01-09 (×4): 15 mL via OROMUCOSAL
  Filled 2016-01-08 (×5): qty 15

## 2016-01-08 MED ORDER — LEVOFLOXACIN IN D5W 500 MG/100ML IV SOLN
500.0000 mg | INTRAVENOUS | Status: DC
  Filled 2016-01-08: qty 100

## 2016-01-08 MED ORDER — LEVOFLOXACIN IN D5W 750 MG/150ML IV SOLN
750.0000 mg | INTRAVENOUS | Status: DC
  Filled 2016-01-08: qty 150

## 2016-01-08 MED ORDER — LEVOFLOXACIN IN D5W 750 MG/150ML IV SOLN
750.0000 mg | Freq: Once | INTRAVENOUS | Status: AC
  Administered 2016-01-08: 750 mg via INTRAVENOUS
  Filled 2016-01-08 (×2): qty 150

## 2016-01-08 MED ORDER — GUAIFENESIN ER 600 MG PO TB12
600.0000 mg | ORAL_TABLET | Freq: Every day | ORAL | Status: DC
  Administered 2016-01-08 – 2016-01-09 (×2): 600 mg via ORAL
  Filled 2016-01-08 (×2): qty 1

## 2016-01-08 NOTE — Consult Note (Signed)
Patient denies any further bleeding.  Hgb 8.6, PT 18.9 with INR of 1.6.  BP 90/65, P 96, T 97.5   Chest is clear, abd neg. Continue current treatments.  HR responding to amiodarone.  No new GI suggestions.  Dr. Servando SnareWohl will be back tomorrow.

## 2016-01-08 NOTE — Progress Notes (Signed)
[facility]  SUBJECTIVE: No complaints   Filed Vitals:   01/08/16 0300 01/08/16 0400 01/08/16 0500 01/08/16 0730  BP: 96/71 98/70 90/65    Pulse: 99 105 96   Temp:    97.5 F (36.4 C)  TempSrc:    Oral  Resp: 23 23 19    Height:      Weight:      SpO2: 100% 100% 100%     Intake/Output Summary (Last 24 hours) at 01/08/16 0937 Last data filed at 01/07/16 2000  Gross per 24 hour  Intake 245.08 ml  Output      0 ml  Net 245.08 ml    LABS: Basic Metabolic Panel:  Recent Labs  16/09/9600/13/17 0531 01/06/16 1141 01/07/16 0020  NA  --  140 140  K  --  4.6 4.6  CL  --  98* 99*  CO2  --  26 23  GLUCOSE  --  164* 108*  BUN  --  90* 95*  CREATININE  --  10.18* 11.07*  CALCIUM  --  9.2 8.8*  MG 2.2  --   --    Liver Function Tests: No results for input(s): AST, ALT, ALKPHOS, BILITOT, PROT, ALBUMIN in the last 72 hours. No results for input(s): LIPASE, AMYLASE in the last 72 hours. CBC:  Recent Labs  01/06/16 0524 01/07/16 0500  WBC 10.8* 12.7*  HGB 8.3* 8.6*  HCT 24.8* 26.7*  MCV 94.2 95.0  PLT 173 164   Cardiac Enzymes:  Recent Labs  01/06/16 1141 01/06/16 1743 01/07/16 0020  TROPONINI 0.04* 0.04* 0.04*   BNP: Invalid input(s): POCBNP D-Dimer: No results for input(s): DDIMER in the last 72 hours. Hemoglobin A1C: No results for input(s): HGBA1C in the last 72 hours. Fasting Lipid Panel: No results for input(s): CHOL, HDL, LDLCALC, TRIG, CHOLHDL, LDLDIRECT in the last 72 hours. Thyroid Function Tests: No results for input(s): TSH, T4TOTAL, T3FREE, THYROIDAB in the last 72 hours.  Invalid input(s): FREET3 Anemia Panel: No results for input(s): VITAMINB12, FOLATE, FERRITIN, TIBC, IRON, RETICCTPCT in the last 72 hours.   Physical Exam: Blood pressure 90/65, pulse 96, temperature 97.5 F (36.4 C), temperature source Oral, resp. rate 19, height 6' (1.829 m), weight 104.6 kg (230 lb 9.6 oz), SpO2 100 %.    General  appearance: alert and cooperative Resp: diminished breath sounds bilaterally Cardio: irregularly irregular rhythm GI: soft, non-tender; bowel sounds normal; no masses,  no organomegaly Extremities: edema 2+ lower extremety edema Neurologic: Grossly normal  TELEMETRY: Reviewed telemetry pt in afib with variable vr. Rate 105 this am:  ASSESSMENT AND PLAN:    Active Problems:   GI bleed-no further gi bleed   Acute upper GI bleed  esrd-on hd.  afib-Developed recurrent afib with rvr post dialysis. Placed back on iv amiodarone with good rate control this am. Will continue with this to complete load.   Dalia HeadingFATH,Elyana Grabski A., MD, [facility] 01/08/2016 9:37 AM

## 2016-01-08 NOTE — Progress Notes (Signed)
Pt came back from dialysis in A fib , with HR 130, then sustained at 120. Po Amiodarone given at 20:30 and HR decreased slightly to 110, but now is back to sustained 120s. Pt is awake , alert and orientated, BP is 96/66. Dr Sheryle Hailiamond contacted and new orders noted

## 2016-01-08 NOTE — Progress Notes (Signed)
[facility] - [place] at [facility]   PATIENT NAME: [name] [name]    MR#:  [id number]  DATE OF BIRTH:  February 15, 1956  SUBJECTIVE:  CHIEF COMPLAINT:   Chief Complaint  Patient presents with  . Fatigue   patient is a 60 year old male who presents to the hospital with complaints of right upper quadrant, epigastric abdominal pain and melena, also fatigue, exertional dyspnea. In emergency room, he was noted to be severely anemic with hemoglobin level of 4.9. Patient was transfused 4 units of packed blood cells after which his hemoglobin level improved Patient was also noted to be coagulopathic with INR more than 10, he was given fresh frozen plasma and vitamin K and his pro time improved. The patient was complaining of nausea and was given Zofran and later Phenergan due to relentless nausea, initially was in sinus tachycardia and developed atrial fibrillation, RVR and then wide complex tachycardia with a rate of 130-170, he was given amiodarone, lidocaine, Cardizem, magnesium IV, converted to A. fib then subsequently into normal sinus rhythm . He had no chest pains and his blood pressure remained relatively stable except of minimal hypotension with V. tach episode. Patient's troponin was slightly elevated. Patient was transferred to ICU . Developed A. fib, RVR to 130 last night after hemodialysis again. Back on amiodarone IV drip to continue and complete loading  . Review of Systems  Constitutional: Negative for fever, chills and weight loss.  HENT: Negative for congestion.   Eyes: Negative for blurred vision and double vision.  Respiratory: Negative for cough, sputum production, shortness of breath and wheezing.   Cardiovascular: Positive for chest pain. Negative for palpitations, orthopnea, leg swelling and PND.  Gastrointestinal: Positive for abdominal pain. Negative for nausea, vomiting, diarrhea, constipation and blood in stool.  Genitourinary: Negative for dysuria, urgency,  frequency and hematuria.  Musculoskeletal: Negative for falls.  Neurological: Negative for dizziness, tremors, focal weakness and headaches.  Endo/Heme/Allergies: Does not bruise/bleed easily.  Psychiatric/Behavioral: Negative for depression. The patient does not have insomnia.     VITAL SIGNS: Blood pressure 104/72, pulse 95, temperature 98.8 F (37.1 C), temperature source Oral, resp. rate 18, height 6' (1.829 m), weight 104.6 kg (230 lb 9.6 oz), SpO2 100 %.  PHYSICAL EXAMINATION:   GENERAL:  60 y.o.-year-old patient lying in the bed in no distress, alert and comfortable.  EYES: Pupils equal, round, reactive to light and accommodation. No scleral icterus. Extraocular muscles intact.  HEENT: Head atraumatic, normocephalic. Oropharynx and nasopharynx clear.  NECK:  Supple, no jugular venous distention. No thyroid enlargement, no tenderness.  LUNGS: Diminished breath sounds bilaterally, no wheezing, rales,rhonchi or crepitation. No use of accessory muscles of respiration.  CARDIOVASCULAR: S1, S2 tachycardic , distant. No murmurs, rubs, or gallops.  ABDOMEN: Soft, minimal, if any tenderness epigastric, right upper quadrant area but no rebound or guarding was noted, nondistended. Bowel sounds present. No organomegaly or mass.  EXTREMITIES: No pedal edema, cyanosis, or clubbing.  NEUROLOGIC: Cranial nerves II through XII are intact. Muscle strength 5/5 in all extremities. Sensation intact. Gait not checked.  PSYCHIATRIC: The patient is alert and oriented x 3.  SKIN: No obvious rash, lesion, or ulcer.   ORDERS/RESULTS REVIEWED:   CBC  Recent Labs Lab 01/04/16 1508 01/05/16 0416 01/05/16 1906 01/06/16 0524 01/07/16 0500  WBC 9.6 9.0  --  10.8* 12.7*  HGB 5.0* 5.8* 8.4* 8.3* 8.6*  HCT 15.5* 17.5*  --  24.8* 26.7*  PLT 197 153  --  173 164  MCV 93.6 92.1  --  94.2 95.0  MCH 30.6 30.7  --  31.5 30.8  MCHC 32.7 33.3  --  33.4 32.4  RDW 16.9* 15.9*  --  16.8* 16.5*    ------------------------------------------------------------------------------------------------------------------  Chemistries   Recent Labs Lab 01/04/16 1508 01/05/16 0416 01/06/16 0531 01/06/16 1141 01/07/16 0020  NA 141 142  --  140 140  K 3.7 4.2  --  4.6 4.6  CL 96* 100*  --  98* 99*  CO2 31 31  --  26 23  GLUCOSE 171* 132*  --  164* 108*  BUN 54* 66*  --  90* 95*  CREATININE 6.85* 7.98*  --  10.18* 11.07*  CALCIUM 8.6* 8.7*  --  9.2 8.8*  MG  --   --  2.2  --   --    ------------------------------------------------------------------------------------------------------------------ estimated creatinine clearance is 9.1 mL/min (by C-G formula based on Cr of 11.07). ------------------------------------------------------------------------------------------------------------------ No results for input(s): TSH, T4TOTAL, T3FREE, THYROIDAB in the last 72 hours.  Invalid input(s): FREET3  Cardiac Enzymes  Recent Labs Lab 01/06/16 1141 01/06/16 1743 01/07/16 0020  TROPONINI 0.04* 0.04* 0.04*   ------------------------------------------------------------------------------------------------------------------ Invalid input(s): POCBNP ---------------------------------------------------------------------------------------------------------------  RADIOLOGY: Dg Chest Port 1 View  01/08/2016  CLINICAL DATA:  Elevated white blood cell count. Patient is being seen for leukocytosis. Hx renal disorder, diabetes, htn, esrd, gerd, peripheral vascular catheterization 11/29/2015 EXAM: PORTABLE CHEST 1 VIEW COMPARISON:  None. FINDINGS: Stable enlarged cardiac silhouette. There is bilateral pleural effusions and basilar atelectasis. No overt pulmonary edema. No pneumothorax. IMPRESSION: Cardiomegaly. Bibasilar effusions with atelectasis versus infiltrates. Electronically Signed   By: Genevive BiStewart  Edmunds M.D.   On: 01/08/2016 09:41    EKG:  Orders placed or performed during the hospital  encounter of 01/04/16  . ED EKG  . ED EKG  . EKG 12-Lead  . EKG 12-Lead    ASSESSMENT AND PLAN:  Active Problems:   GI bleed   Acute upper GI bleed 1. Acute posthemorrhagic anemia, status post 4 units of packed cell blood cell transfusion , hemoglobin level is stable, transfuse as needed. Bleeding has stopped. Follow hemoglobin level closely, discharge home when hemodynamically stable  2. Upper gastrointestinal bleed of unclear etiology at this time, patient was on aspirin therapy, has been complaining of right upper quadrant epigastric abdominal discomfort for months now. Patient is to continue Protonix IV BID, may change to PO tomorrow , gastroenterology consultation is appreciated ,  EGD is going to be done as outpatient after cardiac work up/stabilization is completed.  3. Coagulopathy due to Coumadin, status post fresh frozen plasma and vitamin K administration, ProTime has improved.  Bleeding has stopped. Follow ProTime INR intermittently 4. Hypotension, stable, no IVF infusion due to cardiomyopathy. Holding blood pressure medications for now, may restart coreg upon  Dc home.  5. End-stage renal disease,  consultation from nephrologist was obtained, hemodialysis per nephrology.  6. Secondary hyperparathyroidism, resumed sevelamer at home doses .  7. A. fib, RVR , deteriorating to likely V. Tach, now on amiodarone intravenously again to complete loading.  Resume Coreg when patient's blood pressure is better, will likely need defibrillator 8 elevated troponin, likely demand ischemia. Cardiology consultation is appreciated, work up per them, may need defibrillator 9. Bacterial pneumonia with leukocytosis, initiate levofloxacin orally, get sputum cultures if possible  Management plans discussed with the patient, family and they are in agreement.   DRUG ALLERGIES: No Known Allergies  CODE STATUS:     Code Status Orders  Start     Ordered   01/04/16 1951  Full code    Continuous     01/04/16 1951    Code Status History    Date Active Date Inactive Code Status Order ID Comments User Context   This patient has a current code status but no historical code status.      TOTAL CRITICAL CARE TIME TAKING CARE OF THIS PATIENT: 35 minutes.    Katharina Caper M.D on 01/08/2016 at 12:51 PM  Between 7am to 6pm - Pager - [phone]  After 6pm go to [email or web address] - password [email or web address] [facility]  [facility]  Office  [phone]  CC: Primary care physician; Clydie Braun, MD

## 2016-01-08 NOTE — Progress Notes (Signed)
ANTIBIOTIC CONSULT NOTE - INITIAL  Pharmacy Consult for levofloxacin Indication: pneumonia  No Known Allergies  Patient Measurements: Height: 6' (182.9 cm) Weight: 230 lb 9.6 oz (104.6 kg) IBW/kg (Calculated) : 77.6  Vital Signs: Temp: 98.8 F (37.1 C) (01/15 0900) Temp Source: Oral (01/15 0900) BP: 104/72 mmHg (01/15 0900) Pulse Rate: 95 (01/15 0900)  Labs:  Recent Labs  01/05/16 1906 01/06/16 0524 01/06/16 1141 01/07/16 0020 01/07/16 0500  WBC  --  10.8*  --   --  12.7*  HGB 8.4* 8.3*  --   --  8.6*  PLT  --  173  --   --  164  CREATININE  --   --  10.18* 11.07*  --    Estimated Creatinine Clearance: 9.1 mL/min (by C-G formula based on Cr of 11.07).  Microbiology: No results found for this or any previous visit (from the past 720 hour(s)).  Assessment: Pharmacy consulted to dose levofloxacin for CAP in this 60 year old male. WBC yesterday was 12.7. Patient is currently afebrile. Patient is on dialysis.  Plan:  Give levofloxacin 750 mg IV dose x1 today Followed by levofloxacin 500 mg IV q 48 hours (starting on 1/17) per renal function and indication.   Cindi CarbonMary M Joycelin Radloff  Clinical Pharmacist 01/08/2016,10:12 AM

## 2016-01-08 NOTE — Progress Notes (Signed)
Subjective:  Overall doing fair Rapid response team was called 1/13 because of lethargy after iv phenergan and arrythmias with HD in 180's (A fib with RVR) Given iv cardizem, Currently on iv amiodarone Patient again had tachycardia, A. fib post dialysis yesterday with a heart rate in the 130s No acute complaints today  Objective:  Vital signs in last 24 hours:  Temp:  [97.5 F (36.4 C)-99.4 F (37.4 C)] 98.8 F (37.1 C) (01/15 0900) Pulse Rate:  [94-133] 95 (01/15 0900) Resp:  [2-38] 18 (01/15 0900) BP: (83-105)/(58-73) 104/72 mmHg (01/15 0900) SpO2:  [94 %-100 %] 100 % (01/15 0900) Weight:  [104.6 kg (230 lb 9.6 oz)-107.7 kg (237 lb 7 oz)] 104.6 kg (230 lb 9.6 oz) (01/14 1930)  Weight change:  Filed Weights   01/06/16 0647 01/07/16 1535 01/07/16 1930  Weight: 106.232 kg (234 lb 3.2 oz) 107.7 kg (237 lb 7 oz) 104.6 kg (230 lb 9.6 oz)    Intake/Output:    Intake/Output Summary (Last 24 hours) at 01/08/16 1144 Last data filed at 01/08/16 0900  Gross per 24 hour  Intake 554.92 ml  Output      0 ml  Net 554.92 ml     Physical Exam: General: NAD  HEENT Moist oral mucus membranes  Neck supple  Pulm/lungs Coarse breath sounds, normal effort  CVS/Heart iregular, no rub  Abdomen:  Soft, NT  Extremities: no edema  Neurologic: Alert, oreinted  Skin: No acute rash  Access: AVF       Basic Metabolic Panel:   Recent Labs Lab 01/04/16 1508 01/05/16 0416 01/06/16 0531 01/06/16 1141 01/07/16 0020  NA 141 142  --  140 140  K 3.7 4.2  --  4.6 4.6  CL 96* 100*  --  98* 99*  CO2 31 31  --  26 23  GLUCOSE 171* 132*  --  164* 108*  BUN 54* 66*  --  90* 95*  CREATININE 6.85* 7.98*  --  10.18* 11.07*  CALCIUM 8.6* 8.7*  --  9.2 8.8*  MG  --   --  2.2  --   --      CBC:  Recent Labs Lab 01/04/16 1508 01/05/16 0416 01/05/16 1906 01/06/16 0524 01/07/16 0500  WBC 9.6 9.0  --  10.8* 12.7*  HGB 5.0* 5.8* 8.4* 8.3* 8.6*  HCT 15.5* 17.5*  --  24.8* 26.7*  MCV  93.6 92.1  --  94.2 95.0  PLT 197 153  --  173 164      Microbiology:  No results found for this or any previous visit (from the past 720 hour(s)).  Coagulation Studies:  Recent Labs  01/06/16 0524  LABPROT 18.9*  INR 1.58    Urinalysis: No results for input(s): COLORURINE, LABSPEC, PHURINE, GLUCOSEU, HGBUR, BILIRUBINUR, KETONESUR, PROTEINUR, UROBILINOGEN, NITRITE, LEUKOCYTESUR in the last 72 hours.  Invalid input(s): APPERANCEUR    Imaging: Dg Chest Port 1 View  01/08/2016  CLINICAL DATA:  Elevated white blood cell count. Patient is being seen for leukocytosis. Hx renal disorder, diabetes, htn, esrd, gerd, peripheral vascular catheterization 11/29/2015 EXAM: PORTABLE CHEST 1 VIEW COMPARISON:  None. FINDINGS: Stable enlarged cardiac silhouette. There is bilateral pleural effusions and basilar atelectasis. No overt pulmonary edema. No pneumothorax. IMPRESSION: Cardiomegaly. Bibasilar effusions with atelectasis versus infiltrates. Electronically Signed   By: Genevive BiStewart  Edmunds M.D.   On: 01/08/2016 09:41     Medications:   . amiodarone 30 mg/hr (01/08/16 0700)   . antiseptic oral rinse  7  mL Mouth Rinse q12n4p  . chlorhexidine  15 mL Mouth Rinse BID  . epoetin (EPOGEN/PROCRIT) injection  10,000 Units Intravenous Q M,W,F-HD  . [START ON 01/10/2016] levofloxacin (LEVAQUIN) IV  500 mg Intravenous Q48H  . levofloxacin (LEVAQUIN) IV  750 mg Intravenous Once  . pantoprazole (PROTONIX) IV  40 mg Intravenous Q12H  . sevelamer carbonate  1,600 mg Oral With snacks  . sevelamer carbonate  3,200 mg Oral TID WC  . simvastatin  20 mg Oral QHS   acetaminophen **OR** acetaminophen, ondansetron **OR** ondansetron (ZOFRAN) IV, promethazine  Assessment/ Plan:  60 y.o. male with a PMHX of ESRD, hypertension, hyperlipidemia, diabetes, atrial fibrillation, was admitted on 01/04/2016 with supratherapeutic INR, dark stools and severe anemia. 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 End-stage renal disease. [employer] dialysis. Tuesday/Thursday/Saturday. [facility] nephrology - Next hemodialysis on Tuesday  2. Severe anemia - chronic kidney disease and GI bleed - anemia is secondary to GI bleed from supratherapeutic INR - continue high-dose Procrit - GI work up in progress - Endoscopy couldn't be done because of A. fib/RVR and hemodynamic instability  3. Secondary hyperparathyroidism - May resume sevelamer at home dose once able to take Normal diet  4. Atrial fibrillation with RVR - Causing hypotension - Cardiology evaluation in progress    LOS: 4 Ygnacio Fecteau 1/15/201711:44 AM

## 2016-01-08 NOTE — Progress Notes (Addendum)
Patient in room from ICU. Amiodarone going at 16.7 ml/hr. Tele box on and verified with Keon nt. Vs stable. Patient resting quietly in bed with bed in the lowest position and call light in reach. Skin verified with Gwenyth BenderLeah L RN.

## 2016-01-09 LAB — MRSA PCR SCREENING: MRSA BY PCR: NEGATIVE

## 2016-01-09 MED ORDER — AMIODARONE HCL 200 MG PO TABS
400.0000 mg | ORAL_TABLET | Freq: Two times a day (BID) | ORAL | Status: DC
  Administered 2016-01-09 (×2): 400 mg via ORAL
  Filled 2016-01-09 (×2): qty 2

## 2016-01-09 NOTE — Progress Notes (Signed)
CSW spoke to patient at discharge. [place] and [place] [employer] [place] pass. Patient reports that he does not have any other concerns at this time. CSW is signing off and will be available if a need were to arise.  Woodroe Modehristina Carmela Piechowski, MSW, LCSW-A Clinical Social Work Department [phone]

## 2016-01-09 NOTE — Care Management Important Message (Signed)
Important Message  Patient Details  Name: [name] [name] MRN: [id number] Date of Birth: 10/11/1956   Medicare Important Message Given:  Yes    Jarren Para A, RN 01/09/2016, 8:13 AM

## 2016-01-09 NOTE — Progress Notes (Signed)
Amiodarone drip discontinued. 400mg  of PO given before drip discontinued. Blood pressure is stable. Will monitor closely.

## 2016-01-09 NOTE — Progress Notes (Signed)
[facility] [place] at [facility]   PATIENT NAME: [name] [name]    MR#:  [id number]  DATE OF BIRTH:  1956-01-09  SUBJECTIVE:  CHIEF COMPLAINT:   Chief Complaint  Patient presents with  . Fatigue   patient is a 60 year old male who presents to the hospital with complaints of right upper quadrant, epigastric abdominal pain and melena, also fatigue, exertional dyspnea. In emergency room, he was noted to be severely anemic with hemoglobin level of 4.9. Patient was transfused 4 units of packed blood cells after which his hemoglobin level improved Patient was also noted to be coagulopathic with INR more than 10, he was given fresh frozen plasma and vitamin K and his pro time improved. The patient was complaining of nausea and was given Phenergan ,  developed atrial fibrillation, RVR and then wide complex tachycardia with a rate of 130-170, he was given amiodarone, lidocaine, Cardizem, magnesium IV, converted to A. fib then subsequently into normal sinus rhythm . He had no chest pains , but had mild hypotension with V. tach episode. Patient's troponin was slightly elevated. Patient was transferred to ICU . Developed A. fib, RVR to 130 , few nights before and was initiated again on amiodarone IV drip. Today, however, he is again converted into amiodarone orally per cardiologist, Dr. Lady Gary. He feels good. Denies any pains in his abdomen or chest. Remains in sinus tachycardia at 101. The patient was to go home. No more bleeding. Not on Coumadin anymore.    . Review of Systems  Constitutional: Negative for fever, chills and weight loss.  HENT: Negative for congestion.   Eyes: Negative for blurred vision and double vision.  Respiratory: Negative for cough, sputum production, shortness of breath and wheezing.   Cardiovascular: Positive for chest pain. Negative for palpitations, orthopnea, leg swelling and PND.  Gastrointestinal: Positive for abdominal pain. Negative for nausea,  vomiting, diarrhea, constipation and blood in stool.  Genitourinary: Negative for dysuria, urgency, frequency and hematuria.  Musculoskeletal: Negative for falls.  Neurological: Negative for dizziness, tremors, focal weakness and headaches.  Endo/Heme/Allergies: Does not bruise/bleed easily.  Psychiatric/Behavioral: Negative for depression. The patient does not have insomnia.     VITAL SIGNS: Blood pressure 100/66, pulse 98, temperature 98.4 F (36.9 C), temperature source Oral, resp. rate 18, height 6' (1.829 m), weight 104.6 kg (230 lb 9.6 oz), SpO2 88 %.  PHYSICAL EXAMINATION:   GENERAL:  60 y.o.-year-old patient lying in the bed in no distress, alert and comfortable.  EYES: Pupils equal, round, reactive to light and accommodation. No scleral icterus. Extraocular muscles intact.  HEENT: Head atraumatic, normocephalic. Oropharynx and nasopharynx clear.  NECK:  Supple, no jugular venous distention. No thyroid enlargement, no tenderness.  LUNGS: Diminished breath sounds bilaterally, no wheezing, rales,rhonchi or crepitation. No use of accessory muscles of respiration.  CARDIOVASCULAR: S1, S2 tachycardic , distant. No murmurs, rubs, or gallops.  ABDOMEN: Soft, nontender,  nondistended. Bowel sounds present. No organomegaly or mass.  EXTREMITIES: No pedal edema, cyanosis, or clubbing.  NEUROLOGIC: Cranial nerves II through XII are intact. Muscle strength 5/5 in all extremities. Sensation intact. Gait not checked.  PSYCHIATRIC: The patient is alert and oriented x 3.  SKIN: No obvious rash, lesion, or ulcer.   ORDERS/RESULTS REVIEWED:   CBC  Recent Labs Lab 01/04/16 1508 01/05/16 0416 01/05/16 1906 01/06/16 0524 01/07/16 0500  WBC 9.6 9.0  --  10.8* 12.7*  HGB 5.0* 5.8* 8.4* 8.3* 8.6*  HCT 15.5* 17.5*  --  24.8* 26.7*  PLT 197 153  --  173 164  MCV 93.6 92.1  --  94.2 95.0  MCH 30.6 30.7  --  31.5 30.8  MCHC 32.7 33.3  --  33.4 32.4  RDW 16.9* 15.9*  --  16.8* 16.5*    ------------------------------------------------------------------------------------------------------------------  Chemistries   Recent Labs Lab 01/04/16 1508 01/05/16 0416 01/06/16 0531 01/06/16 1141 01/07/16 0020  NA 141 142  --  140 140  K 3.7 4.2  --  4.6 4.6  CL 96* 100*  --  98* 99*  CO2 31 31  --  26 23  GLUCOSE 171* 132*  --  164* 108*  BUN 54* 66*  --  90* 95*  CREATININE 6.85* 7.98*  --  10.18* 11.07*  CALCIUM 8.6* 8.7*  --  9.2 8.8*  MG  --   --  2.2  --   --    ------------------------------------------------------------------------------------------------------------------ estimated creatinine clearance is 9.1 mL/min (by C-G formula based on Cr of 11.07). ------------------------------------------------------------------------------------------------------------------ No results for input(s): TSH, T4TOTAL, T3FREE, THYROIDAB in the last 72 hours.  Invalid input(s): FREET3  Cardiac Enzymes  Recent Labs Lab 01/06/16 1141 01/06/16 1743 01/07/16 0020  TROPONINI 0.04* 0.04* 0.04*   ------------------------------------------------------------------------------------------------------------------ Invalid input(s): POCBNP ---------------------------------------------------------------------------------------------------------------  RADIOLOGY: Dg Chest Port 1 View  01/08/2016  CLINICAL DATA:  Elevated white blood cell count. Patient is being seen for leukocytosis. Hx renal disorder, diabetes, htn, esrd, gerd, peripheral vascular catheterization 11/29/2015 EXAM: PORTABLE CHEST 1 VIEW COMPARISON:  None. FINDINGS: Stable enlarged cardiac silhouette. There is bilateral pleural effusions and basilar atelectasis. No overt pulmonary edema. No pneumothorax. IMPRESSION: Cardiomegaly. Bibasilar effusions with atelectasis versus infiltrates. Electronically Signed   By: Genevive Bi M.D.   On: 01/08/2016 09:41    EKG:  Orders placed or performed during the hospital  encounter of 01/04/16  . ED EKG  . ED EKG  . EKG 12-Lead  . EKG 12-Lead    ASSESSMENT AND PLAN:  Active Problems:   GI bleed   Acute upper GI bleed 1. Acute posthemorrhagic anemia, status post 4 units of packed cell blood cell transfusion , hemoglobin level is stable, transfuse as needed. Bleeding has stopped. Follow hemoglobin level closely, discharge home when hemodynamically stable  2. Upper gastrointestinal bleed of unclear etiology at this time, patient was on aspirin therapy, has been complaining of right upper quadrant epigastric abdominal discomfort for months now. Patient is to continue Protonix IV BID, may change to PO upon discharge, gastroenterology consultation is appreciated ,  EGD is likely going to be done as outpatient after cardiac stabilization, as outpatient, follow-up was gastroenterologist.   3. Coagulopathy due to Coumadin, status post fresh frozen plasma and vitamin K administration, ProTime has improved.  Bleeding has stopped. The patient will be discharged without Coumadin .  4. Hypotension, resolving, no IVF infusion due to cardiomyopathy. Holding blood pressure medications for now, may restart coreg upon  Dc home, likely tomorrow.  5. End-stage renal disease,  consultation from nephrologist was obtained, hemodialysis per nephrology.  6. Secondary hyperparathyroidism, resumed sevelamer at home doses .  7. A. fib, RVR , deteriorating to likely V. Tach, now on amiodarone orally, patient is to follow up with cardiologist as outpatient.  Resume Coreg when patient's blood pressure is better 8 elevated troponin, likely demand ischemia. Cardiology consultation is appreciated, follow-up as outpatient 9. Bacterial pneumonia with leukocytosis, continue levofloxacin orally, get sputum cultures if possible 10. Hypoxemia, chest x-ray yesterday revealed a basilar effusions with atelectasis versus  infiltrates, initiate incentive spirometry. No diuretics due to relative  hypotension  Management plans discussed with the patient, family and they are in agreement.   DRUG ALLERGIES: No Known Allergies  CODE STATUS:     Code Status Orders        Start     Ordered   01/04/16 1951  Full code   Continuous     01/04/16 1951    Code Status History    Date Active Date Inactive Code Status Order ID Comments User Context   This patient has a current code status but no historical code status.      TOTAL TIME TAKING CARE OF THIS PATIENT: 35 minutes.    Katharina CaperVAICKUTE,Kiylah Loyer M.D on 01/09/2016 at 2:00 PM  Between 7am to 6pm - Pager - [phone]  After 6pm go to [email or web address] - password [email or web address] [facility]  [facility]  Office  [phone]  CC: Primary care physician; Clydie BraunFITZGERALD, [name], MD

## 2016-01-09 NOTE — Clinical Social Work Note (Signed)
Clinical Social Work Assessment  Patient Details  Name: [name] [name] MRN: [id number] Date of Birth: 06/20/1956  Date of referral:  01/09/16               Reason for consult:  Discharge Planning                Permission sought to share information with:    Permission granted to share information::  No  Name::        Agency::     Relationship::     Contact Information:     Housing/Transportation Living arrangements for the past 2 months:  Single Family Home Source of Information:  Patient Patient Interpreter Needed:  None Criminal Activity/Legal Involvement Pertinent to Current Situation/Hospitalization:  No - Comment as needed Significant Relationships:  Soil scientist Lives with:  Self Do you feel safe going back to the place where you live?  Yes Need for family participation in patient care:  No (Coment)  Care giving concerns:  CSW received a consult stating that patient didn't feel safe going home.   Social Worker assessment / plan:  CSW met with patient in his room. Patient was sitting in bed watching television. CSW explained CSW's role. Patient reports he understands CSW role. Per patient he lives home alone. He reports that he has a "male friend" that comes and visits him and takes care of him when he's sick.  He said he likes his home and is pleased with renovations he's completed. Patient reports that he feels safe at home. Patient reports hat he received a settlement from his past job due to an injury. He reports that accepting the settlement has hindered him from obtaining [place]. He reports that he used his settlement to fix his home and has been living off the rest of it. Per patient he has spoken to his Medicare worker and they "won't tell me when I can re-apply" for Social Security Disability Benefits. CSW encouraged patient to contact his Medicare worker again and being his Social Security Disability application again. He reports that he  will. Per patient he receives dialysis on MWF. He reports that he catches the bus to his appointments. He also reports that his friend takes him to appointments when she doesn't have to work. Per patient he uses a walker and boot to walk. He reports that he also uses a wheel chair when he feels "weaker". Patient reports that O2 is new. CSW will inform RN Case Manager in case he needs it at discharge. Patient asked about transportation home at discharge. CSW inquired about family resources. Per patient he may need transportation at discharge if his friend cannot provide him with transportation. CSW can provide transportation resources at discharge.   CSW will continue to follow and assist.   Ernest Pine, MSW, [place] Work Department [phone]    Employment status:  Disabled (Comment on whether or not currently receiving Disability) Insurance information:  Medicare PT Recommendations:  Not assessed at this time Information / Referral to community resources:     Patient/Family's Response to care:  Patient is in agreement with going home at discharge.    Patient/Family's Understanding of and Emotional Response to Diagnosis, Current Treatment, and Prognosis:  Patient understands CSW's role, was pleasant and appreciative of CSW's assistance.   Emotional Assessment Appearance:  Appears stated age Attitude/Demeanor/Rapport:   (None ) Affect (typically observed):  Calm, Pleasant Orientation:  Oriented to Self, Oriented to Place, Oriented to  Time, Oriented to Situation Alcohol / Substance use:  Not Applicable Psych involvement (Current and /or in the community):  No (Comment)  Discharge Needs  Concerns to be addressed:  Discharge Planning Concerns Readmission within the last 30 days:  No Current discharge risk:  Chronically ill Barriers to Discharge:  Continued Medical Work up   [employer], LCSW 01/09/2016, 11:43 AM

## 2016-01-09 NOTE — Care Management Note (Signed)
[facility] [address]  i have sent admission records to center and will follow up with additional records at discharge.  Ivor ReiningKim Armour Villanueva  Dialysis Liaison   [phone]

## 2016-01-09 NOTE — Progress Notes (Signed)
Subjective:  Patient resting comfortably in bed today. States that his hearing is decreased. Currently denies worsening shortness of breath.  Objective:  Vital signs in last 24 hours:  Temp:  [98 F (36.7 C)-98.4 F (36.9 C)] 98.4 F (36.9 C) (01/16 1144) Pulse Rate:  [95-100] 96 (01/16 1646) Resp:  [18] 18 (01/16 1144) BP: (95-122)/(60-68) 100/66 mmHg (01/16 1144) SpO2:  [88 %-100 %] 90 % (01/16 1646)  Weight change:  Filed Weights   01/06/16 0647 01/07/16 1535 01/07/16 1930  Weight: 106.232 kg (234 lb 3.2 oz) 107.7 kg (237 lb 7 oz) 104.6 kg (230 lb 9.6 oz)    Intake/Output:    Intake/Output Summary (Last 24 hours) at 01/09/16 1728 Last data filed at 01/09/16 1300  Gross per 24 hour  Intake 1760.64 ml  Output      0 ml  Net 1760.64 ml     Physical Exam: General: NAD  HEENT Moist oral mucus membranes  Neck supple  Pulm/lungs  scattered rhonchi, normal effort  CVS/Heart iregular, no rub  Abdomen:  Soft, NTND BS prseent  Extremities: no edema  Neurologic: Alert, oriented x 3 follows commands  Skin: No acute rash  Access: LUE AVF       Basic Metabolic Panel:   Recent Labs Lab 01/04/16 1508 01/05/16 0416 01/06/16 0531 01/06/16 1141 01/07/16 0020  NA 141 142  --  140 140  K 3.7 4.2  --  4.6 4.6  CL 96* 100*  --  98* 99*  CO2 31 31  --  26 23  GLUCOSE 171* 132*  --  164* 108*  BUN 54* 66*  --  90* 95*  CREATININE 6.85* 7.98*  --  10.18* 11.07*  CALCIUM 8.6* 8.7*  --  9.2 8.8*  MG  --   --  2.2  --   --      CBC:  Recent Labs Lab 01/04/16 1508 01/05/16 0416 01/05/16 1906 01/06/16 0524 01/07/16 0500  WBC 9.6 9.0  --  10.8* 12.7*  HGB 5.0* 5.8* 8.4* 8.3* 8.6*  HCT 15.5* 17.5*  --  24.8* 26.7*  MCV 93.6 92.1  --  94.2 95.0  PLT 197 153  --  173 164      Microbiology:  Recent Results (from the past 720 hour(s))  MRSA PCR Screening     Status: None   Collection Time: 01/09/16  2:38 PM  Result Value Ref Range Status   MRSA by PCR  NEGATIVE NEGATIVE Final    Comment:        The GeneXpert MRSA Assay (FDA approved for NASAL specimens only), is one component of a comprehensive MRSA colonization surveillance program. It is not intended to diagnose MRSA infection nor to guide or monitor treatment for MRSA infections.     Coagulation Studies: No results for input(s): LABPROT, INR in the last 72 hours.  Urinalysis: No results for input(s): COLORURINE, LABSPEC, PHURINE, GLUCOSEU, HGBUR, BILIRUBINUR, KETONESUR, PROTEINUR, UROBILINOGEN, NITRITE, LEUKOCYTESUR in the last 72 hours.  Invalid input(s): APPERANCEUR    Imaging: Dg Chest Port 1 View  01/08/2016  CLINICAL DATA:  Elevated white blood cell count. Patient is being seen for leukocytosis. Hx renal disorder, diabetes, htn, esrd, gerd, peripheral vascular catheterization 11/29/2015 EXAM: PORTABLE CHEST 1 VIEW COMPARISON:  None. FINDINGS: Stable enlarged cardiac silhouette. There is bilateral pleural effusions and basilar atelectasis. No overt pulmonary edema. No pneumothorax. IMPRESSION: Cardiomegaly. Bibasilar effusions with atelectasis versus infiltrates. Electronically Signed   By: Loura Halt.D.  On: 01/08/2016 09:41     Medications:     . amiodarone  400 mg Oral BID  . antiseptic oral rinse  7 mL Mouth Rinse q12n4p  . chlorhexidine  15 mL Mouth Rinse BID  . epoetin (EPOGEN/PROCRIT) injection  10,000 Units Intravenous Q M,W,F-HD  . guaiFENesin  600 mg Oral Daily  . [START ON 01/10/2016] levofloxacin (LEVAQUIN) IV  500 mg Intravenous Q48H  . pantoprazole (PROTONIX) IV  40 mg Intravenous Q12H  . sevelamer carbonate  1,600 mg Oral With snacks  . sevelamer carbonate  3,200 mg Oral TID WC  . simvastatin  20 mg Oral QHS   acetaminophen **OR** acetaminophen, chlorpheniramine-HYDROcodone, ondansetron **OR** ondansetron (ZOFRAN) IV, promethazine  Assessment/ Plan:  60 y.o. male with a PMHX of ESRD, hypertension, hyperlipidemia, diabetes, atrial  fibrillation, was admitted on 01/04/2016 with supratherapeutic INR, dark stools and severe anemia. 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 End-stage renal disease[facility]Thursday[facility] - No acute indication for dialysis today. We will plan for hemodialysis again tomorrow.  2. Severe anemia - chronic kidney disease and GI bleed - anemia is secondary to GI bleed from supratherapeutic INR - Hemoglobin 8.6 at last check. Recommend continued monitoring of CBC. Continue Epogen with dialysis.  3. Secondary hyperparathyroidism - May resume sevelamer at home dose once able to take normal diet  4. Atrial fibrillation with RVR - Causing hypotension - Cardiology following, on amiodarone to avoid hypotension.    LOS: 5 Jandy Brackens 1/16/20175:28 PM

## 2016-01-09 NOTE — Care Management Note (Addendum)
Case Management Note  Patient Details  Name: [name] [name] MRN: [id number] Date of Birth: 06/11/1956  Subjective/Objective:    59yo Mr [name] [name] was admitted  01/03/15 with an upper GI bleed. Received 4 units PRBCs.  Hgb is now 8.6. No obvious bleeding.has been on ASA therapy for A-FibA PCP=Dr Fitzgerald. [place] [place]. Resides with his significant other. Assistive equipment includes a cane,wheellchair, and a rolling walker. No home oxygen and no home health services. Transportation is via bus or rides with a friend.  Case management will follow for discharge planning.              Action/Plan:   Expected Discharge Date:  01/07/16               Expected Discharge Plan:     In-House Referral:     Discharge planning Services     Post Acute Care Choice:    Choice offered to:     DME Arranged:    DME Agency:     HH Arranged:    HH Agency:     Status of Service:     Medicare Important Message Given:  Yes Date Medicare IM Given:    Medicare IM give by:    Date Additional Medicare IM Given:    Additional Medicare Important Message give by:     If discussed at Long Length of Stay Meetings, dates discussed:    Additional Comments:  Brandonn Capelli A, RN 01/09/2016, 4:55 PM

## 2016-01-09 NOTE — Progress Notes (Signed)
Patient has rested comfortably today. No complaints. Vitals are stable. Heart rate has stayed in the low 100's since discontinuing amiodarone drip. Hemoglobin is stable. Dialysis is planned for tomorrow. Will continue to monitor.

## 2016-01-10 LAB — CBC
HCT: 26.1 % — ABNORMAL LOW (ref 40.0–52.0)
HEMATOCRIT: 26.2 % — AB (ref 40.0–52.0)
HEMOGLOBIN: 8.6 g/dL — AB (ref 13.0–18.0)
Hemoglobin: 8.5 g/dL — ABNORMAL LOW (ref 13.0–18.0)
MCH: 31.1 pg (ref 26.0–34.0)
MCH: 31.1 pg (ref 26.0–34.0)
MCHC: 33 g/dL (ref 32.0–36.0)
MCHC: 32.6 g/dL (ref 32.0–36.0)
MCV: 94.2 fL (ref 80.0–100.0)
MCV: 95.6 fL (ref 80.0–100.0)
PLATELETS: 188 10*3/uL (ref 150–440)
Platelets: 181 10*3/uL (ref 150–440)
RBC: 2.77 MIL/uL — ABNORMAL LOW (ref 4.40–5.90)
RBC: 2.74 MIL/uL — ABNORMAL LOW (ref 4.40–5.90)
RDW: 18.2 % — AB (ref 11.5–14.5)
RDW: 18.6 % — AB (ref 11.5–14.5)
WBC: 8.1 10*3/uL (ref 3.8–10.6)
WBC: 7.9 10*3/uL (ref 3.8–10.6)

## 2016-01-10 LAB — PHOSPHORUS: PHOSPHORUS: 4.8 mg/dL — AB (ref 2.5–4.6)

## 2016-01-10 MED ORDER — HEPARIN SODIUM (PORCINE) 1000 UNIT/ML DIALYSIS
1000.0000 [IU] | INTRAMUSCULAR | Status: DC | PRN
  Filled 2016-01-10: qty 1

## 2016-01-10 MED ORDER — CARVEDILOL 3.125 MG PO TABS: 3.1250 mg | ORAL_TABLET | Freq: Two times a day (BID) | ORAL | Status: AC

## 2016-01-10 MED ORDER — PENTAFLUOROPROP-TETRAFLUOROETH EX AERO
1.0000 "application " | INHALATION_SPRAY | CUTANEOUS | Status: DC | PRN
  Filled 2016-01-10: qty 30

## 2016-01-10 MED ORDER — SODIUM CHLORIDE 0.9 % IV SOLN: 100.0000 mL | INTRAVENOUS | Status: DC | PRN

## 2016-01-10 MED ORDER — ALTEPLASE 2 MG IJ SOLR
2.0000 mg | Freq: Once | INTRAMUSCULAR | Status: DC | PRN
  Filled 2016-01-10: qty 2

## 2016-01-10 MED ORDER — AMIODARONE HCL 400 MG PO TABS: 400.0000 mg | ORAL_TABLET | Freq: Every day | ORAL | Status: AC

## 2016-01-10 MED ORDER — LIDOCAINE-PRILOCAINE 2.5-2.5 % EX CREA
1.0000 "application " | TOPICAL_CREAM | CUTANEOUS | Status: DC | PRN
  Filled 2016-01-10: qty 5

## 2016-01-10 MED ORDER — LIDOCAINE HCL (PF) 1 % IJ SOLN
5.0000 mL | INTRAMUSCULAR | Status: DC | PRN
  Filled 2016-01-10: qty 5

## 2016-01-10 NOTE — Progress Notes (Signed)
All D/C forms completed and given to pt. Pt verbalizes understanding of new medications and changes on old medications. RX given to pt. Follow up appointments explained to pt.  Telemetry and IV d/c. Personal belongings returned to pt. VS stable no complaints.

## 2016-01-10 NOTE — Progress Notes (Signed)
Post hd tx 

## 2016-01-10 NOTE — Discharge Summary (Signed)
[facility] - [place] at [facility]   PATIENT NAME: [name] [name]    MR#:  [id number]  DATE OF BIRTH:  05-23-1956  DATE OF ADMISSION:  01/04/2016 ADMITTING PHYSICIAN: Houston Siren, MD  DATE OF DISCHARGE: 01/10/2016  3:01 PM  PRIMARY CARE PHYSICIAN: [name], [name], MD    ADMISSION DIAGNOSIS:  Acute upper GI bleed [K92.2] Anemia, unspecified anemia type [D64.9]  DISCHARGE DIAGNOSIS:  Active Problems:   GI bleed   Acute upper GI bleed   SECONDARY DIAGNOSIS:   Past Medical History  Diagnosis Date  . Renal disorder   . Diabetes mellitus without complication (HCC)   . Hypertension   . Hyperlipidemia   . ESRD (end stage renal disease) (HCC)   . Secondary hyperparathyroidism (HCC)   . GERD (gastroesophageal reflux disease)     HOSPITAL COURSE:   60 year old male with past history of end-stage renal disease on hemodialysis, hypertension, GERD, chronic atrial fibrillation, who presents to the hospital due to weakness, fatigue and noted to be anemic with melanotic stools.  1. Acute posthemorrhagic anemia - this was due to coagulopathy from Coumadin. Pt. Was transfused a total of 4 packed red cells and Hg. Has improved and remained stable.   - he was taken off coumadin for now. This needs to be further addressed w/ his Cardiologist as outpatient.  - presently he is hemodynamically stable upon discharge.   2. Upper gastrointestinal bleed -patient was admitted with melanotic stools. This was the cause of patient's anemia. She was transfused and hemoglobin is improved posttransfusion. -Patient was seen by gastroenterology and they recommended doing an EGD but it was not done during hospitalization as patient had bouts of acute arrhythmia and had to be placed on Cardizem drip a few times. -At present patient has been taken off his Coumadin indefinitely until further discussion with his cardiologist. -He will continue PPI daily for now.  3. Coagulopathy due  to Coumadin, status post fresh frozen plasma and vitamin K administration, ProTime has improved. Bleeding has stopped. The patient was discharged without Coumadin .   4. Hypotension - this was due to volume loss from the GI bleed/anemia combined with some autonomic dysfunction given his end-stage renal disease. After transfusion and control of his A. fib with RVR his hypotension has improved. -Patient's antihypertensives were adjusted prior to discharge.  5. End-stage renal disease-patient was seen by nephrology and continued on dialysis per his home schedule.  6. Secondary hyperparathyroidism-patient will resume his Renvela upon discharge.   7. A. fib, RVR-patient developed elevated heart rates as high as 150s while in the hospital. At one point it was thought to be ventricular tachycardia and patient was given amiodarone and also lidocaine. A cardiology consult was obtained and they thought this was probably A. fib with aberrancy. 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 -Patient was weaned off the amiodarone drip and now has been maintained on oral amiodarone and is stable on that. He will continue oral amiodarone and follow-up with cardiology as an outpatient. -He has been taken off his Coumadin given his anemia/GI bleed and this is to be further discussed with his cardiologist.  #8 elevated troponin - this was demand ischemia. Cardiology consulted and they will follow up with him as outpatient.   #9 Hyperlipidemia - he will resume his Simvastatin.   DISCHARGE CONDITIONS:   Stable  CONSULTS OBTAINED:  Treatment Team:  Mosetta Pigeon, MD Dalia Heading, MD  DRUG ALLERGIES:  No  Known Allergies  DISCHARGE MEDICATIONS:   Discharge Medication List as of 01/10/2016  2:13 PM    START taking these medications   Details  amiodarone (PACERONE) 400 MG tablet Take 1 tablet (400 mg total) by mouth daily., Starting 01/10/2016, Until Discontinued, Print       CONTINUE these medications which have CHANGED   Details  carvedilol (COREG) 3.125 MG tablet Take 1 tablet (3.125 mg total) by mouth 2 (two) times daily with a meal., Starting 01/10/2016, Until Discontinued, Print      CONTINUE these medications which have NOT CHANGED   Details  aspirin 81 MG chewable tablet Chew 81 mg by mouth every evening., Until Discontinued, Historical Med    b complex-vitamin c-folic acid (NEPHRO-VITE) 0.8 MG TABS tablet Take 1 tablet by mouth daily., Until Discontinued, Historical Med    esomeprazole (NEXIUM) 40 MG capsule Take 40 mg by mouth daily., Until Discontinued, Historical Med    insulin aspart (NOVOLOG) 100 UNIT/ML injection Inject 0-20 Units into the skin 3 (three) times daily with meals as needed for high blood sugar. Pt uses per sliding scale., Until Discontinued, Historical Med    sevelamer carbonate (RENVELA) 800 MG tablet Take 1,600-3,200 mg by mouth 5 (five) times daily. Pt takes four capsules three times a day with meals and two capsules two times a day with snacks., Until Discontinued, Historical Med    simvastatin (ZOCOR) 20 MG tablet Take 20 mg by mouth at bedtime. , Until Discontinued, Historical Med    traMADol (ULTRAM) 50 MG tablet Take 1 tablet (50 mg total) by mouth every 6 (six) hours as needed., Starting 09/08/2015, Until Discontinued, Print      STOP taking these medications     diltiazem (DILACOR XR) 120 MG 24 hr capsule      warfarin (COUMADIN) 4 MG tablet          DISCHARGE INSTRUCTIONS:   DIET:  Cardiac diet, Diabetic diet and Renal diet  DISCHARGE CONDITION:  Stable  ACTIVITY:  Activity as tolerated  OXYGEN:  Home Oxygen: No.   Oxygen Delivery: room air  DISCHARGE LOCATION:  home   If you experience worsening of your admission symptoms, develop shortness of breath, life threatening emergency, suicidal or homicidal thoughts you must seek medical attention immediately by calling 911 or calling your MD immediately   if symptoms less severe.  You Must read complete instructions/literature along with all the possible adverse reactions/side effects for all the Medicines you take and that have been prescribed to you. Take any new Medicines after you have completely understood and accpet all the possible adverse reactions/side effects.   Please note  You were cared for by a hospitalist during your hospital stay. If you have any questions about your discharge medications or the care you received while you were in the hospital after you are discharged, you can call the unit and asked to speak with the hospitalist on call if the hospitalist that took care of you is not available. Once you are discharged, your primary care physician will handle any further medical issues. Please note that NO REFILLS for any discharge medications will be authorized once you are discharged, as it is imperative that you return to your primary care physician (or establish a relationship with a primary care physician if you do not have one) for your aftercare needs so that they can reassess your need for medications and monitor your lab values.     Today   No complaints.  Shortness  of breath improved.  No chest pain, abdominal pain, N/V.   VITAL SIGNS:  Blood pressure 118/73, pulse 100, temperature 97.5 F (36.4 C), temperature source Oral, resp. rate 18, height 6' (1.829 m), weight 104.01 kg (229 lb 4.8 oz), SpO2 95 %.  I/O:   Intake/Output Summary (Last 24 hours) at 01/10/16 1711 Last data filed at 01/10/16 1313  Gross per 24 hour  Intake      0 ml  Output   2500 ml  Net  -2500 ml    PHYSICAL EXAMINATION:   GENERAL: 60 y.o.-year-old patient lying in the bed with no acute distress.  EYES: Pupils equal, round, reactive to light and accommodation. No scleral icterus. Extraocular muscles intact.  HEENT: Head atraumatic, normocephalic. Oropharynx and nasopharynx clear. No oropharyngeal erythema, moist oral mucosa  NECK:  Supple, no jugular venous distention. No thyroid enlargement, no tenderness.  LUNGS: Normal breath sounds bilaterally, no wheezing, rales, rhonchi. No use of accessory muscles of respiration.  CARDIOVASCULAR: S1, S2 Irregular. No murmurs, rubs, gallops, clicks.  ABDOMEN: Soft, nontender, nondistended. Bowel sounds present. No organomegaly or mass.  EXTREMITIES: No pedal edema, cyanosis, or clubbing. + 2 pedal & radial pulses b/l. Left foot everted due to hx of previous fracture.  NEUROLOGIC: Cranial nerves II through XII are intact. No focal Motor or sensory deficits appreciated b/l. Globally weak PSYCHIATRIC: The patient is alert and oriented x 3. Good affect.  SKIN: No obvious rash, lesion, or ulcer.   Left upper extremity AV fistula with good bruit and thrill.   DATA REVIEW:   CBC  Recent Labs Lab 01/10/16 1014  WBC 7.9  HGB 8.5*  HCT 26.2*  PLT 188    Chemistries   Recent Labs Lab 01/06/16 0531  01/07/16 0020  NA  --   < > 140  K  --   < > 4.6  CL  --   < > 99*  CO2  --   < > 23  GLUCOSE  --   < > 108*  BUN  --   < > 95*  CREATININE  --   < > 11.07*  CALCIUM  --   < > 8.8*  MG 2.2  --   --   < > = values in this interval not displayed.  Cardiac Enzymes  Recent Labs Lab 01/07/16 0020  TROPONINI 0.04*    Microbiology Results  Results for orders placed or performed during the hospital encounter of 01/04/16  MRSA PCR Screening     Status: None   Collection Time: 01/09/16  2:38 PM  Result Value Ref Range Status   MRSA by PCR NEGATIVE NEGATIVE Final    Comment:        The GeneXpert MRSA Assay (FDA approved for NASAL specimens only), is one component of a comprehensive MRSA colonization surveillance program. It is not intended to diagnose MRSA infection nor to guide or monitor treatment for MRSA infections.     RADIOLOGY:  No results found.    Management plans discussed with the patient, family and they are in agreement.  CODE STATUS:      Code Status Orders        Start     Ordered   01/04/16 1951  Full code   Continuous     01/04/16 1951    Code Status History    Date Active Date Inactive Code Status Order ID Comments User Context   This patient has a current code status but no historical  code status.      TOTAL TIME TAKING CARE OF THIS PATIENT: 40 minutes.    Houston Siren M.D on 01/10/2016 at 5:11 PM  Between 7am to 6pm - Pager - [phone]  After 6pm go to [email or web address] - password [email or web address] [facility]  [facility]  Office  [phone]  CC: Primary care physician; Clydie Braun, MD

## 2016-01-10 NOTE — Progress Notes (Signed)
Hemodialysis start 

## 2016-01-10 NOTE — Care Management Note (Signed)
Patient is active at [facility] on T[facility].  Admission records have been sent and additional records sent at discharge.  Ivor Reining  Dialysis Liaison  [phone]

## 2016-01-10 NOTE — Progress Notes (Signed)
Hemodialysis completed. 

## 2016-01-10 NOTE — Progress Notes (Signed)
Subjective:  Patient seen and evaluated during hemodialysis. Tolerating treatment well thus far. Overall feeling better.   Objective:  Vital signs in last 24 hours:  Temp:  [98.3 F (36.8 C)-98.4 F (36.9 C)] 98.3 F (36.8 C) (01/17 0643) Pulse Rate:  [96-106] 106 (01/17 0643) Resp:  [18] 18 (01/17 0643) BP: (95-108)/(64-69) 108/69 mmHg (01/17 0643) SpO2:  [88 %-93 %] 93 % (01/17 0643)  Weight change:  Filed Weights   01/06/16 0647 01/07/16 1535 01/07/16 1930  Weight: 106.232 kg (234 lb 3.2 oz) 107.7 kg (237 lb 7 oz) 104.6 kg (230 lb 9.6 oz)    Intake/Output:    Intake/Output Summary (Last 24 hours) at 01/10/16 1016 Last data filed at 01/09/16 1300  Gross per 24 hour  Intake    480 ml  Output      0 ml  Net    480 ml     Physical Exam: General: NAD  HEENT Moist oral mucus membranes  Neck supple  Pulm/lungs scattered rhonchi, normal effort  CVS/Heart irregular, no rub  Abdomen:  Soft, NTND BS prseent  Extremities: no edema  Neurologic: Alert, oriented x 3 follows commands  Skin: No acute rash  Access: LUE AVF       Basic Metabolic Panel:   Recent Labs Lab 01/04/16 1508 01/05/16 0416 01/06/16 0531 01/06/16 1141 01/07/16 0020  NA 141 142  --  140 140  K 3.7 4.2  --  4.6 4.6  CL 96* 100*  --  98* 99*  CO2 31 31  --  26 23  GLUCOSE 171* 132*  --  164* 108*  BUN 54* 66*  --  90* 95*  CREATININE 6.85* 7.98*  --  10.18* 11.07*  CALCIUM 8.6* 8.7*  --  9.2 8.8*  MG  --   --  2.2  --   --      CBC:  Recent Labs Lab 01/04/16 1508 01/05/16 0416 01/05/16 1906 01/06/16 0524 01/07/16 0500 01/10/16 0532  WBC 9.6 9.0  --  10.8* 12.7* 8.1  HGB 5.0* 5.8* 8.4* 8.3* 8.6* 8.6*  HCT 15.5* 17.5*  --  24.8* 26.7* 26.1*  MCV 93.6 92.1  --  94.2 95.0 94.2  PLT 197 153  --  173 164 181      Microbiology:  Recent Results (from the past 720 hour(s))  MRSA PCR Screening     Status: None   Collection Time: 01/09/16  2:38 PM  Result Value Ref Range  Status   MRSA by PCR NEGATIVE NEGATIVE Final    Comment:        The GeneXpert MRSA Assay (FDA approved for NASAL specimens only), is one component of a comprehensive MRSA colonization surveillance program. It is not intended to diagnose MRSA infection nor to guide or monitor treatment for MRSA infections.     Coagulation Studies: No results for input(s): LABPROT, INR in the last 72 hours.  Urinalysis: No results for input(s): COLORURINE, LABSPEC, PHURINE, GLUCOSEU, HGBUR, BILIRUBINUR, KETONESUR, PROTEINUR, UROBILINOGEN, NITRITE, LEUKOCYTESUR in the last 72 hours.  Invalid input(s): APPERANCEUR    Imaging: No results found.   Medications:     . amiodarone  400 mg Oral BID  . antiseptic oral rinse  7 mL Mouth Rinse q12n4p  . chlorhexidine  15 mL Mouth Rinse BID  . epoetin (EPOGEN/PROCRIT) injection  10,000 Units Intravenous Q M,W,F-HD  . guaiFENesin  600 mg Oral Daily  . levofloxacin (LEVAQUIN) IV  500 mg Intravenous Q48H  . pantoprazole (  PROTONIX) IV  40 mg Intravenous Q12H  . sevelamer carbonate  1,600 mg Oral With snacks  . sevelamer carbonate  3,200 mg Oral TID WC  . simvastatin  20 mg Oral QHS   acetaminophen **OR** acetaminophen, chlorpheniramine-HYDROcodone, ondansetron **OR** ondansetron (ZOFRAN) IV, promethazine  Assessment/ Plan:  60 y.o. male with a PMHX of ESRD, hypertension, hyperlipidemia, diabetes, atrial fibrillation, was admitted on 01/04/2016 with supratherapeutic INR, dark stools and severe anemia. 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 End-stage renal disease. [employer] dialysis. Tuesday/Thursday/Saturday. [facility] nephrology - Patient seen and evaluated during hemodialysis. Thus far tolerating  well. Patient will resume dialysis on a Tuesday, Thursday, Saturday schedule as an outpatient..  2. Severe anemia - chronic kidney disease and GI bleed - anemia is secondary to GI bleed from supratherapeutic INR - Hemoglobin stable at 8.6. Continue Epogen with dialysis today. He will need this as an outpatient as well.  3. Secondary hyperparathyroidism - Continue Renvela for phosphorous control.  4. Atrial fibrillation with RVR - Causing hypotension - Cardiology following, on amiodarone to avoid hypotension.    LOS: 6 Solomon Skowronek 1/17/201710:16 AM

## 2016-01-10 NOTE — Progress Notes (Signed)
Pre-hd tx 

## 2016-01-11 LAB — PARATHYROID HORMONE, INTACT (NO CA): PTH: 799 pg/mL — AB (ref 15–65)

## 2016-01-30 ENCOUNTER — Other Ambulatory Visit: Payer: Self-pay | Admitting: Vascular Surgery

## 2016-01-31 ENCOUNTER — Encounter
Admission: RE | Disposition: A | Discharge status: Home / Self Care | Payer: Self-pay | Source: Ambulatory Visit | Attending: Vascular Surgery

## 2016-01-31 ENCOUNTER — Ambulatory Visit
Admission: RE | Admit: 2016-01-31 | Discharge: 2016-01-31 | Disposition: A | Discharge status: Home / Self Care | Payer: Medicare Other | Source: Ambulatory Visit | Attending: Vascular Surgery | Admitting: Vascular Surgery

## 2016-01-31 SURGERY — A/V SHUNTOGRAM/FISTULAGRAM
Anesthesia: Moderate Sedation

## 2016-02-07 ENCOUNTER — Inpatient Hospital Stay
Admission: RE | Admit: 2016-02-07 | Discharge: 2016-02-11 | DRG: 296 | Disposition: A | Discharge status: Home / Self Care | Payer: Medicare Other | Source: Ambulatory Visit | Attending: Vascular Surgery | Admitting: Vascular Surgery

## 2016-02-07 ENCOUNTER — Ambulatory Visit: Payer: Medicare Other

## 2016-02-07 ENCOUNTER — Encounter
Admission: RE | Disposition: A | Discharge status: Home / Self Care | Payer: Self-pay | Source: Ambulatory Visit | Attending: Vascular Surgery

## 2016-02-07 DIAGNOSIS — R579 Shock, unspecified: Secondary | ICD-10-CM | POA: Diagnosis not present

## 2016-02-07 DIAGNOSIS — Z801 Family history of malignant neoplasm of trachea, bronchus and lung: Secondary | ICD-10-CM

## 2016-02-07 DIAGNOSIS — E1122 Type 2 diabetes mellitus with diabetic chronic kidney disease: Secondary | ICD-10-CM | POA: Diagnosis present

## 2016-02-07 DIAGNOSIS — Z7982 Long term (current) use of aspirin: Secondary | ICD-10-CM

## 2016-02-07 DIAGNOSIS — D631 Anemia in chronic kidney disease: Secondary | ICD-10-CM | POA: Diagnosis present

## 2016-02-07 DIAGNOSIS — K219 Gastro-esophageal reflux disease without esophagitis: Secondary | ICD-10-CM | POA: Diagnosis present

## 2016-02-07 DIAGNOSIS — N2581 Secondary hyperparathyroidism of renal origin: Secondary | ICD-10-CM | POA: Diagnosis present

## 2016-02-07 DIAGNOSIS — N186 End stage renal disease: Secondary | ICD-10-CM | POA: Diagnosis present

## 2016-02-07 DIAGNOSIS — Z79899 Other long term (current) drug therapy: Secondary | ICD-10-CM

## 2016-02-07 DIAGNOSIS — Z794 Long term (current) use of insulin: Secondary | ICD-10-CM

## 2016-02-07 DIAGNOSIS — L03116 Cellulitis of left lower limb: Secondary | ICD-10-CM | POA: Diagnosis present

## 2016-02-07 DIAGNOSIS — I120 Hypertensive chronic kidney disease with stage 5 chronic kidney disease or end stage renal disease: Secondary | ICD-10-CM | POA: Diagnosis present

## 2016-02-07 DIAGNOSIS — I4891 Unspecified atrial fibrillation: Secondary | ICD-10-CM | POA: Diagnosis present

## 2016-02-07 DIAGNOSIS — R002 Palpitations: Secondary | ICD-10-CM | POA: Diagnosis not present

## 2016-02-07 DIAGNOSIS — I82402 Acute embolism and thrombosis of unspecified deep veins of left lower extremity: Secondary | ICD-10-CM

## 2016-02-07 DIAGNOSIS — Z8249 Family history of ischemic heart disease and other diseases of the circulatory system: Secondary | ICD-10-CM

## 2016-02-07 DIAGNOSIS — S92909G Unspecified fracture of unspecified foot, subsequent encounter for fracture with delayed healing: Secondary | ICD-10-CM | POA: Diagnosis not present

## 2016-02-07 DIAGNOSIS — M7989 Other specified soft tissue disorders: Secondary | ICD-10-CM

## 2016-02-07 DIAGNOSIS — Z992 Dependence on renal dialysis: Secondary | ICD-10-CM

## 2016-02-07 DIAGNOSIS — I469 Cardiac arrest, cause unspecified: Principal | ICD-10-CM | POA: Diagnosis present

## 2016-02-07 DIAGNOSIS — E11610 Type 2 diabetes mellitus with diabetic neuropathic arthropathy: Secondary | ICD-10-CM | POA: Diagnosis present

## 2016-02-07 DIAGNOSIS — Z79891 Long term (current) use of opiate analgesic: Secondary | ICD-10-CM | POA: Diagnosis not present

## 2016-02-07 DIAGNOSIS — E785 Hyperlipidemia, unspecified: Secondary | ICD-10-CM | POA: Diagnosis present

## 2016-02-07 DIAGNOSIS — I739 Peripheral vascular disease, unspecified: Secondary | ICD-10-CM | POA: Diagnosis present

## 2016-02-07 DIAGNOSIS — T82898A Other specified complication of vascular prosthetic devices, implants and grafts, initial encounter: Secondary | ICD-10-CM | POA: Diagnosis present

## 2016-02-07 DIAGNOSIS — I429 Cardiomyopathy, unspecified: Secondary | ICD-10-CM | POA: Diagnosis present

## 2016-02-07 HISTORY — PX: PERIPHERAL VASCULAR CATHETERIZATION: SHX172C

## 2016-02-07 LAB — CBC
HEMATOCRIT: 32.8 % — AB (ref 40.0–52.0)
Hemoglobin: 10.6 g/dL — ABNORMAL LOW (ref 13.0–18.0)
MCH: 29.7 pg (ref 26.0–34.0)
MCHC: 32.4 g/dL (ref 32.0–36.0)
MCV: 91.9 fL (ref 80.0–100.0)
Platelets: 178 10*3/uL (ref 150–440)
RBC: 3.57 MIL/uL — ABNORMAL LOW (ref 4.40–5.90)
RDW: 18.2 % — AB (ref 11.5–14.5)
WBC: 5.9 10*3/uL (ref 3.8–10.6)

## 2016-02-07 LAB — POTASSIUM (ARMC VASCULAR LAB ONLY): Potassium (ARMC vascular lab): 3.7 (ref 3.5–5.1)

## 2016-02-07 LAB — CREATININE, SERUM
CREATININE: 6.59 mg/dL — AB (ref 0.61–1.24)
GFR, EST AFRICAN AMERICAN: 10 mL/min — AB (ref >60)
GFR, EST NON AFRICAN AMERICAN: 8 mL/min — AB (ref >60)

## 2016-02-07 SURGERY — UPPER EXTREMITY ANGIOGRAPHY
Laterality: Right

## 2016-02-07 MED ORDER — SEVELAMER CARBONATE 800 MG PO TABS: 1600.0000 mg | ORAL_TABLET | Freq: Two times a day (BID) | ORAL | Status: DC

## 2016-02-07 MED ORDER — HEPARIN SODIUM (PORCINE) 1000 UNIT/ML IJ SOLN
INTRAMUSCULAR | Status: AC
  Filled 2016-02-07: qty 1

## 2016-02-07 MED ORDER — MORPHINE SULFATE (PF) 4 MG/ML IV SOLN: 2.0000 mg | INTRAVENOUS | Status: DC | PRN

## 2016-02-07 MED ORDER — SORBITOL 70 % SOLN
30.0000 mL | Freq: Every day | Status: DC | PRN
  Filled 2016-02-07: qty 30

## 2016-02-07 MED ORDER — HEPARIN SODIUM (PORCINE) 5000 UNIT/ML IJ SOLN
5000.0000 [IU] | Freq: Three times a day (TID) | INTRAMUSCULAR | Status: DC
  Administered 2016-02-07 – 2016-02-11 (×10): 5000 [IU] via SUBCUTANEOUS
  Filled 2016-02-07 (×11): qty 1

## 2016-02-07 MED ORDER — MIDAZOLAM HCL 2 MG/2ML IJ SOLN
INTRAMUSCULAR | Status: DC | PRN
  Administered 2016-02-07 (×2): 1 mg via INTRAVENOUS

## 2016-02-07 MED ORDER — HYDROCODONE-ACETAMINOPHEN 5-325 MG PO TABS: 1.0000 | ORAL_TABLET | ORAL | Status: DC | PRN

## 2016-02-07 MED ORDER — AMIODARONE HCL 200 MG PO TABS
400.0000 mg | ORAL_TABLET | Freq: Every day | ORAL | Status: DC
  Administered 2016-02-07 – 2016-02-11 (×4): 400 mg via ORAL
  Filled 2016-02-07 (×4): qty 2

## 2016-02-07 MED ORDER — PIPERACILLIN-TAZOBACTAM 3.375 G IVPB
3.3750 g | Freq: Two times a day (BID) | INTRAVENOUS | Status: DC
  Administered 2016-02-07 – 2016-02-11 (×7): 3.375 g via INTRAVENOUS
  Filled 2016-02-07 (×10): qty 50

## 2016-02-07 MED ORDER — LIDOCAINE HCL (PF) 1 % IJ SOLN
INTRAMUSCULAR | Status: AC
  Filled 2016-02-07: qty 30

## 2016-02-07 MED ORDER — HEPARIN (PORCINE) IN NACL 2-0.9 UNIT/ML-% IJ SOLN
INTRAMUSCULAR | Status: AC
  Filled 2016-02-07: qty 1000

## 2016-02-07 MED ORDER — TRAMADOL HCL 50 MG PO TABS: 50.0000 mg | ORAL_TABLET | Freq: Four times a day (QID) | ORAL | Status: DC | PRN

## 2016-02-07 MED ORDER — INSULIN ASPART 100 UNIT/ML ~~LOC~~ SOLN: 0.0000 [IU] | Freq: Three times a day (TID) | SUBCUTANEOUS | Status: DC | PRN

## 2016-02-07 MED ORDER — ACETAMINOPHEN 650 MG RE SUPP: 650.0000 mg | Freq: Four times a day (QID) | RECTAL | Status: DC | PRN

## 2016-02-07 MED ORDER — SEVELAMER CARBONATE 800 MG PO TABS: 3200.0000 mg | ORAL_TABLET | Freq: Three times a day (TID) | ORAL | Status: DC

## 2016-02-07 MED ORDER — ASPIRIN 81 MG PO CHEW: 81.0000 mg | CHEWABLE_TABLET | Freq: Every evening | ORAL | Status: DC

## 2016-02-07 MED ORDER — RENA-VITE PO TABS
1.0000 | ORAL_TABLET | Freq: Every day | ORAL | Status: DC
  Administered 2016-02-07 – 2016-02-10 (×4): 1 via ORAL
  Filled 2016-02-07 (×5): qty 1

## 2016-02-07 MED ORDER — ONDANSETRON HCL 4 MG PO TABS: 4.0000 mg | ORAL_TABLET | Freq: Four times a day (QID) | ORAL | Status: DC | PRN

## 2016-02-07 MED ORDER — FLEET ENEMA 7-19 GM/118ML RE ENEM: 1.0000 | ENEMA | Freq: Once | RECTAL | Status: DC | PRN

## 2016-02-07 MED ORDER — ONDANSETRON HCL 4 MG/2ML IJ SOLN: 4.0000 mg | Freq: Four times a day (QID) | INTRAMUSCULAR | Status: DC | PRN

## 2016-02-07 MED ORDER — HEPARIN SODIUM (PORCINE) 5000 UNIT/ML IJ SOLN: 5000.0000 [IU] | Freq: Three times a day (TID) | INTRAMUSCULAR | Status: DC

## 2016-02-07 MED ORDER — SIMVASTATIN 20 MG PO TABS
20.0000 mg | ORAL_TABLET | Freq: Every day | ORAL | Status: DC
  Administered 2016-02-07 – 2016-02-10 (×4): 20 mg via ORAL
  Filled 2016-02-07 (×4): qty 1

## 2016-02-07 MED ORDER — PANTOPRAZOLE SODIUM 40 MG PO TBEC
40.0000 mg | DELAYED_RELEASE_TABLET | Freq: Every day | ORAL | Status: DC
  Administered 2016-02-07 – 2016-02-11 (×4): 40 mg via ORAL
  Filled 2016-02-07 (×4): qty 1

## 2016-02-07 MED ORDER — IOHEXOL 300 MG/ML  SOLN
INTRAMUSCULAR | Status: DC | PRN
  Administered 2016-02-07: 65 mL via INTRAVENOUS

## 2016-02-07 MED ORDER — DOCUSATE SODIUM 100 MG PO CAPS
100.0000 mg | ORAL_CAPSULE | Freq: Two times a day (BID) | ORAL | Status: DC
  Administered 2016-02-07 – 2016-02-09 (×6): 100 mg via ORAL
  Filled 2016-02-07 (×6): qty 1

## 2016-02-07 MED ORDER — MAGNESIUM HYDROXIDE 400 MG/5ML PO SUSP: 30.0000 mL | Freq: Every day | ORAL | Status: DC | PRN

## 2016-02-07 MED ORDER — SODIUM CHLORIDE 0.9 % IV SOLN
INTRAVENOUS | Status: DC
  Administered 2016-02-07: 12:00:00 via INTRAVENOUS

## 2016-02-07 MED ORDER — FENTANYL CITRATE (PF) 100 MCG/2ML IJ SOLN
INTRAMUSCULAR | Status: DC | PRN
  Administered 2016-02-07 (×2): 50 ug via INTRAVENOUS

## 2016-02-07 MED ORDER — CARVEDILOL 3.125 MG PO TABS
3.1250 mg | ORAL_TABLET | Freq: Two times a day (BID) | ORAL | Status: DC
  Administered 2016-02-08 – 2016-02-11 (×4): 3.125 mg via ORAL
  Filled 2016-02-07 (×8): qty 1

## 2016-02-07 MED ORDER — FENTANYL CITRATE (PF) 100 MCG/2ML IJ SOLN
INTRAMUSCULAR | Status: AC
  Filled 2016-02-07: qty 2

## 2016-02-07 MED ORDER — ASPIRIN EC 81 MG PO TBEC
81.0000 mg | DELAYED_RELEASE_TABLET | Freq: Every day | ORAL | Status: DC
  Administered 2016-02-07 – 2016-02-11 (×4): 81 mg via ORAL
  Filled 2016-02-07 (×4): qty 1

## 2016-02-07 MED ORDER — ACETAMINOPHEN 325 MG PO TABS: 650.0000 mg | ORAL_TABLET | Freq: Four times a day (QID) | ORAL | Status: DC | PRN

## 2016-02-07 MED ORDER — MIDAZOLAM HCL 5 MG/5ML IJ SOLN
INTRAMUSCULAR | Status: AC
  Filled 2016-02-07: qty 5

## 2016-02-07 SURGICAL SUPPLY — 12 items
CATH ANGIO 5F 100CM .035 PIG (CATHETERS) ×5 IMPLANT
CATH C2 4.0X100 (CATHETERS) ×5 IMPLANT
DEVICE CLOSURE MYNXGRIP 5F (Vascular Products) ×5 IMPLANT
DRAPE BRACHIAL (DRAPES) ×5 IMPLANT
GLIDEWIRE STIFF .35X180X3 HYDR (WIRE) ×5 IMPLANT
PACK ANGIOGRAPHY (CUSTOM PROCEDURE TRAY) ×5 IMPLANT
SET INTRO CAPELLA COAXIAL (SET/KITS/TRAYS/PACK) ×5 IMPLANT
SHEATH BRITE TIP 5FRX11 (SHEATH) ×5 IMPLANT
SYR MEDRAD MARK V 150ML (SYRINGE) ×5 IMPLANT
TOWEL OR 17X26 4PK STRL BLUE (TOWEL DISPOSABLE) ×5 IMPLANT
TUBING CONTRAST HIGH PRESS 72 (TUBING) ×5 IMPLANT
WIRE J 3MM .035X145CM (WIRE) ×5 IMPLANT

## 2016-02-07 NOTE — Progress Notes (Signed)
ANTIBIOTIC CONSULT NOTE - INITIAL  Pharmacy Consult for Zosyn  Indication: cellulitis  No Known Allergies  Patient Measurements: Height: 6' (182.9 cm) Weight: 229 lb (103.874 kg) IBW/kg (Calculated) : 77.6 Adjusted Body Weight:   Vital Signs: Temp: 97.9 F (36.6 C) (02/14 1817) Temp Source: Oral (02/14 1817) BP: 129/82 mmHg (02/14 1817) Pulse Rate: 86 (02/14 1817) Intake/Output from previous day:   Intake/Output from this shift:    Labs: No results for input(s): WBC, HGB, PLT, LABCREA, CREATININE in the last 72 hours. CrCl cannot be calculated (Patient has no serum creatinine result on file.). No results for input(s): VANCOTROUGH, VANCOPEAK, VANCORANDOM, GENTTROUGH, GENTPEAK, GENTRANDOM, TOBRATROUGH, TOBRAPEAK, TOBRARND, AMIKACINPEAK, AMIKACINTROU, AMIKACIN in the last 72 hours.   Microbiology: Recent Results (from the past 720 hour(s))  MRSA PCR Screening     Status: None   Collection Time: 01/09/16  2:38 PM  Result Value Ref Range Status   MRSA by PCR NEGATIVE NEGATIVE Final    Comment:        The GeneXpert MRSA Assay (FDA approved for NASAL specimens only), is one component of a comprehensive MRSA colonization surveillance program. It is not intended to diagnose MRSA infection nor to guide or monitor treatment for MRSA infections.     Medical History: Past Medical History  Diagnosis Date  . Renal disorder   . Diabetes mellitus without complication (HCC)   . Hypertension   . Hyperlipidemia   . ESRD (end stage renal disease) (HCC)   . Secondary hyperparathyroidism (HCC)   . GERD (gastroesophageal reflux disease)     Medications:  Scheduled:  . amiodarone  400 mg Oral Daily  . aspirin  81 mg Oral QPM  . aspirin EC  81 mg Oral Daily  . carvedilol  3.125 mg Oral BID WC  . docusate sodium  100 mg Oral BID  . heparin  5,000 Units Subcutaneous 3 times per day  . multivitamin  1 tablet Oral QHS  . pantoprazole  40 mg Oral Daily  .  piperacillin-tazobactam (ZOSYN)  IV  3.375 g Intravenous Q12H  . [START ON 02/08/2016] sevelamer carbonate  1,600 mg Oral BID BM  . [START ON 02/08/2016] sevelamer carbonate  3,200 mg Oral TID WC  . simvastatin  20 mg Oral QHS   Assessment: Pharmacy consulted to dose zosyn in this 60 year old male with cellulitis, pt on dialysis.  Goal of Therapy:  resolution of infection  Plan:  Expected duration 7 days with resolution of temperature and/or normalization of WBC   Zosyn 3.375 gm IV Q12H EI ordered to start 2/14 .   Derald Lorge D 02/07/2016,7:37 PM

## 2016-02-07 NOTE — Op Note (Signed)
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  Arch aortogram  2.  Left upper extremity angiography third order catheter placement  3.  Minx closure device utilized right common femoral artery    Anesthesia: Conscious sedation was administered under my direct supervision. IV Versed plus fentanyl were utilized. Continuous ECG, pulse oximetry and blood pressure was monitored throughout the entire procedure. Conscious sedation was for a total of 45 minutes.  Sheath: 5 French right common femoral artery  Contrast: 60 cc  Fluoroscopy Time: 3 minutes  Indications:  [name] presents to the office with increasing pain in his left hand consistent with steal. The ultrasound is supportive of this. Prior to ligating his distal radial artery I am performing angiography to ensure that there is no lesion within the proximal arm or ulnar artery the risks and benefits of been reviewed all questions of been answered the patient agrees to proceed  Procedure:  [name] [name] a 60 y.o. male who was identified and appropriate procedural time out was performed.  The patient was then placed supine on the table and prepped and draped in the usual sterile fashion.  Ultrasound was used to evaluate the right common femoral artery.  It was patent .  A digital ultrasound image was acquired.  A Seldinger needle was used to access the right common femoral artery under direct ultrasound guidance and a permanent image was performed.  A 0.035 J wire was advanced without resistance and a 5Fr sheath was placed.    Pigtail catheter is advanced into the ascending aorta and LAO projection of the aortic arch is obtained. Pigtail catheter  is exchanged for an H1 catheter over a stiff angled Glidewire and the catheter is negotiated into the subclavian where hand injection contrast is used to show the distal subclavian and axillary as well as the proximal brachial artery. Wire is reintroduced and the catheter is then advanced down to the level of the elbow were distal runoff is performed.  After review the images the catheters removed over wire oblique view of the right groin is obtained and a minx device deployed there no immediate complications.  Findings:   Arch Aortogram:  The aortic arch is widely patent there is diffuse mild atherosclerotic changes but no ulcerations or other abnormalities noted anatomy of the arch is normal 3 vessel in the origins of the great vessels are widely patent.  Left Upper Extremity:  The subclavian axillary and brachial arteries are widely patent. The trifurcation is also patent all are is smaller than the radial but there are no focal hemodynamically significant lesions noted in the palmar arch is intact with retrograde flow in the distal radial into the AV fistula. There is no forward flow past the fistula in the radial. Radial arteries widely patent. Fistula is also widely patent. Interosseous a small but present and does course down to the wrist.  Based on the images the patient does have significant steal syndrome secondary to his radial basilic fistula. His palmar arch is intact but there is poor filling of the digital vessels secondary to retrograde flow around the arch reversed up the distal radial and into the fistula. This is a suitable situation for ligation of the radial artery distal to the AV fistula.  Disposition: Patient was taken to the recovery room in stable condition having tolerated the procedure well.  [name] 02/07/2016,4:35 PM

## 2016-02-07 NOTE — H&P (Signed)
[facility] History & Physical Update  The patient was interviewed and re-examined.  The patient's previous History and Physical has been reviewed and is unchanged.  There is no change in the plan of care. We plan to proceed with the scheduled procedure.  Schnier, Latina Craver, MD  02/07/2016, 1:50 PM

## 2016-02-08 ENCOUNTER — Encounter: Payer: Self-pay | Admitting: *Deleted

## 2016-02-08 LAB — CBC
HCT: 29.7 % — ABNORMAL LOW (ref 40.0–52.0)
HEMATOCRIT: 29.4 % — AB (ref 40.0–52.0)
HEMOGLOBIN: 9.5 g/dL — AB (ref 13.0–18.0)
Hemoglobin: 9.5 g/dL — ABNORMAL LOW (ref 13.0–18.0)
MCH: 29.8 pg (ref 26.0–34.0)
MCH: 28.9 pg (ref 26.0–34.0)
MCHC: 32.4 g/dL (ref 32.0–36.0)
MCHC: 32.2 g/dL (ref 32.0–36.0)
MCV: 92 fL (ref 80.0–100.0)
MCV: 89.9 fL (ref 80.0–100.0)
PLATELETS: 166 10*3/uL (ref 150–440)
Platelets: 161 10*3/uL (ref 150–440)
RBC: 3.19 MIL/uL — ABNORMAL LOW (ref 4.40–5.90)
RBC: 3.3 MIL/uL — AB (ref 4.40–5.90)
RDW: 17.6 % — AB (ref 11.5–14.5)
RDW: 17.7 % — AB (ref 11.5–14.5)
WBC: 5.7 10*3/uL (ref 3.8–10.6)
WBC: 6.5 10*3/uL (ref 3.8–10.6)

## 2016-02-08 LAB — RENAL FUNCTION PANEL
ALBUMIN: 2.8 g/dL — AB (ref 3.5–5.0)
Anion gap: 11 (ref 5–15)
BUN: 18 mg/dL (ref 6–20)
CALCIUM: 9 mg/dL (ref 8.9–10.3)
CHLORIDE: 98 mmol/L — AB (ref 101–111)
CO2: 30 mmol/L (ref 22–32)
CREATININE: 7.29 mg/dL — AB (ref 0.61–1.24)
GFR, EST AFRICAN AMERICAN: 9 mL/min — AB (ref >60)
GFR, EST NON AFRICAN AMERICAN: 7 mL/min — AB (ref >60)
Glucose, Bld: 122 mg/dL — ABNORMAL HIGH (ref 65–99)
PHOSPHORUS: 3.8 mg/dL (ref 2.5–4.6)
Potassium: 3.9 mmol/L (ref 3.5–5.1)
SODIUM: 139 mmol/L (ref 135–145)

## 2016-02-08 LAB — BASIC METABOLIC PANEL
ANION GAP: 9 (ref 5–15)
BUN: 17 mg/dL (ref 6–20)
CO2: 30 mmol/L (ref 22–32)
Calcium: 8.9 mg/dL (ref 8.9–10.3)
Chloride: 100 mmol/L — ABNORMAL LOW (ref 101–111)
Creatinine, Ser: 6.96 mg/dL — ABNORMAL HIGH (ref 0.61–1.24)
GFR calc Af Amer: 9 mL/min — ABNORMAL LOW (ref >60)
GFR calc non Af Amer: 8 mL/min — ABNORMAL LOW (ref >60)
GLUCOSE: 98 mg/dL (ref 65–99)
POTASSIUM: 3.7 mmol/L (ref 3.5–5.1)
Sodium: 139 mmol/L (ref 135–145)

## 2016-02-08 LAB — DIFFERENTIAL
BASOS ABS: 0.1 10*3/uL (ref 0–0.1)
BASOS PCT: 2 %
Eosinophils Absolute: 0.2 10*3/uL (ref 0–0.7)
Eosinophils Relative: 3 %
LYMPHS PCT: 12 %
Lymphs Abs: 0.8 10*3/uL — ABNORMAL LOW (ref 1.0–3.6)
Monocytes Absolute: 0.5 10*3/uL (ref 0.2–1.0)
Monocytes Relative: 8 %
NEUTROS ABS: 5 10*3/uL (ref 1.4–6.5)
NEUTROS PCT: 75 %

## 2016-02-08 LAB — GLUCOSE, CAPILLARY
GLUCOSE-CAPILLARY: 107 mg/dL — AB (ref 65–99)
GLUCOSE-CAPILLARY: 112 mg/dL — AB (ref 65–99)

## 2016-02-08 MED ORDER — EPOETIN ALFA 10000 UNIT/ML IJ SOLN
10000.0000 [IU] | Freq: Once | INTRAMUSCULAR | Status: AC
  Administered 2016-02-08: 10000 [IU] via INTRAVENOUS

## 2016-02-08 MED ORDER — CALCIUM ACETATE (PHOS BINDER) 667 MG/5ML PO SOLN
3200.0000 mg | Freq: Three times a day (TID) | ORAL | Status: DC
  Filled 2016-02-08 (×18): qty 24

## 2016-02-08 MED ORDER — INSULIN ASPART 100 UNIT/ML ~~LOC~~ SOLN: 0.0000 [IU] | Freq: Every day | SUBCUTANEOUS | Status: DC

## 2016-02-08 MED ORDER — INSULIN ASPART 100 UNIT/ML ~~LOC~~ SOLN: 0.0000 [IU] | Freq: Three times a day (TID) | SUBCUTANEOUS | Status: DC

## 2016-02-08 MED ORDER — CALCIUM ACETATE (PHOS BINDER) 667 MG/5ML PO SOLN
1600.0000 mg | Freq: Two times a day (BID) | ORAL | Status: DC
  Filled 2016-02-08 (×7): qty 12

## 2016-02-08 NOTE — Progress Notes (Signed)
Post hd tx 

## 2016-02-08 NOTE — Consult Note (Signed)
Medical Consultation  [name] [name] ZOX:[id number] DOB: Jan 20, 1956 DOA: 02/07/2016 PCP: Clydie Braun, MD   Requesting physician: Dr Lorretta Harp Date of consultation: February 08 2016 Reason for consultation:  Medical management  Impression/Recommendations 60 year old male with end-stage renal disease and recent upper GI bleed admitted for angiogram for stealsyndrome.  1. End-stage renal disease on hemodialysis: Continue dialysis as per renal team.  2. Cardiomyopathy EF of 20% with atrial fibrillation: Continue amiodarone and Coreg. Consultation cardiology if needed.  3. Hyperlipidemia: Continue simvastatin  4. Diabetes: Continue sliding scale insulin and renal/diabetic diet. Chief Complaint:  No acute issues.  HPI:  Patient is a very wasn't 60 year old male with a history of end-stage renal disease and cardiomyopathy EF of 20% who was recently admitted for GI bleed due to coagulopathy from Coumadin. He was admitted for steal syndrome. Hospital services were contacted for medical management.  Review of Systems  Constitutional: Negative for fever, chills weight loss HENT: Negative for ear pain, nosebleeds, congestion, facial swelling, rhinorrhea, neck pain, neck stiffness and ear discharge.   Respiratory: Negative for cough, shortness of breath, wheezing  Cardiovascular: Negative for chest pain, palpitations and leg swelling.  Gastrointestinal: Negative for heartburn, abdominal pain, vomiting, diarrhea or consitpation Genitourinary: Negative for dysuria, urgency, frequency, hematuria Musculoskeletal: Negative for back pain or joint pain Neurological: Negative for dizziness, seizures, syncope, focal weakness,  numbness and headaches.  Hematological: Does not bruise/bleed easily.  Psychiatric/Behavioral: Negative for hallucinations, confusion, dysphoric mood   Past Medical History  Diagnosis Date  . Renal disorder   . Diabetes mellitus without complication (HCC)   . Hypertension    . Hyperlipidemia   . ESRD (end stage renal disease) (HCC)   . Secondary hyperparathyroidism (HCC)   . GERD (gastroesophageal reflux disease)    Past Surgical History  Procedure Laterality Date  . Peripheral vascular catheterization N/A 11/29/2015    Procedure: A/V Shuntogram/Fistulagram;  Surgeon: [name], MD;  Location: [facility] INVASIVE CV LAB;  Service: Cardiovascular;  Laterality: N/A;  . Peripheral vascular catheterization N/A 11/29/2015    Procedure: A/V Shunt Intervention;  Surgeon: [name], MD;  Location: [facility] INVASIVE CV LAB;  Service: Cardiovascular;  Laterality: N/A;   Social History:  reports that he has never smoked. He does not have any smokeless tobacco history on file. He reports that he does not drink alcohol or use illicit drugs.  No Known Allergies Family History  Problem Relation Age of Onset  . Lung cancer Father   . Heart attack Mother     Prior to Admission medications   Medication Sig Start Date End Date Taking? Authorizing Provider  amiodarone (PACERONE) 400 MG tablet Take 1 tablet (400 mg total) by mouth daily. 01/10/16  Yes Houston Siren, MD  aspirin 81 MG chewable tablet Chew 81 mg by mouth every evening.   Yes Historical Provider, MD  b complex-vitamin c-folic acid (NEPHRO-VITE) 0.8 MG TABS tablet Take 1 tablet by mouth daily.   Yes Historical Provider, MD  carvedilol (COREG) 3.125 MG tablet Take 1 tablet (3.125 mg total) by mouth 2 (two) times daily with a meal. 01/10/16  Yes Houston Siren, MD  esomeprazole (NEXIUM) 40 MG capsule Take 40 mg by mouth daily.   Yes Historical Provider, MD  insulin aspart (NOVOLOG) 100 UNIT/ML injection Inject 0-20 Units into the skin 3 (three) times daily with meals as needed for high blood sugar. Pt uses per sliding scale.   Yes Historical Provider, MD  sevelamer carbonate (RENVELA) 800 MG tablet  Take 1,600-3,200 mg by mouth 5 (five) times daily. Pt takes four capsules three times a day with meals and two  capsules two times a day with snacks.   Yes Historical Provider, MD  simvastatin (ZOCOR) 20 MG tablet Take 20 mg by mouth at bedtime.    Yes Historical Provider, MD  traMADol (ULTRAM) 50 MG tablet Take 1 tablet (50 mg total) by mouth every 6 (six) hours as needed. 09/08/15  Yes Chinita Pester, FNP    Physical Exam: Blood pressure 110/77, pulse 94, temperature 98 F (36.7 C), temperature source Oral, resp. rate 18, height 6' (1.829 m), weight 101.696 kg (224 lb 3.2 oz), SpO2 95 %. @VITALS2 @ Filed Weights   02/07/16 1147 02/08/16 0521 02/08/16 0950  Weight: 103.874 kg (229 lb) 101.696 kg (224 lb 3.2 oz) 101.696 kg (224 lb 3.2 oz)    Intake/Output Summary (Last 24 hours) at 02/08/16 1051 Last data filed at 02/08/16 0900  Gross per 24 hour  Intake      0 ml  Output      0 ml  Net      0 ml     Constitutional: Appears well-developed and well-nourished. No distress. HENT: Normocephalic. Marland Kitchen Oropharynx is clear and moist.  Eyes: Conjunctivae and EOM are normal. PERRLA, no scleral icterus.  Neck: Normal ROM. Neck supple. No JVD. No tracheal deviation. CVS: RRR, S1/S2 +, no murmurs, no gallops, no carotid bruit.  Pulmonary: Effort and breath sounds normal, no stridor, rhonchi, wheezes, rales.  Abdominal: Soft. BS +,  no distension, tenderness, rebound or guarding.  Musculoskeletal: Normal range of motion. No edema and no tenderness.  Neuro: Alert. CN 2-12 grossly intact. No focal deficits. Skin: Skin is warm and dry. No rash noted. left arm AV fistula  Psychiatric: Normal mood and affect.    Labs  Basic Metabolic Panel:  Recent Labs Lab 02/08/16 0404  NA 139  K 3.7  CL 100*  CO2 30  GLUCOSE 98  BUN 17  CREATININE 6.96*  CALCIUM 8.9   Liver Function Tests: No results for input(s): AST, ALT, ALKPHOS, BILITOT, PROT, ALBUMIN in the last 168 hours. No results for input(s): LIPASE, AMYLASE in the last 168 hours.  CBC:  Recent Labs Lab 02/08/16 0404  WBC 5.7  HGB 9.5*   HCT 29.4*  MCV 92.0  PLT 161   Cardiac Enzymes: No results for input(s): CKTOTAL, CKMB, CKMBINDEX, TROPONINI in the last 168 hours. BNP: Invalid input(s): POCBNP CBG: No results for input(s): GLUCAP in the last 168 hours.  Radiological Exams: US Venous Img Lower Unilateral Left  02/07/2016  CLINICAL DATA:  Left lower extremity swelling. EXAM: LEFT LOWER EXTREMITY VENOUS DOPPLER ULTRASOUND TECHNIQUE: Gray-scale sonography with graded compression, as well as color Doppler and duplex ultrasound, were performed to evaluate the deep venous system from the level of the common femoral vein through the popliteal and proximal calf veins. Spectral Doppler was utilized to evaluate flow at rest and with distal augmentation maneuvers. COMPARISON:  09/08/2015 FINDINGS: Right common femoral vein is patent without thrombus. Normal compressibility, augmentation and color Doppler flow in the left common femoral vein, left femoral vein and left popliteal vein. The left saphenofemoral junction is patent. Left profunda femoral vein is patent without thrombus. Limited evaluation of the left calf veins due to soft tissue swelling. There is subcutaneous edema in the left leg. Prominent inguinal lymph nodes bilaterally. IMPRESSION: Negative for deep venous thrombosis in left lower extremity. Prominent bilateral inguinal lymph nodes. Electronically  Signed   By: Richarda Overlie M.D.   On: 02/07/2016 13:40       Thank you for allowing me to participate in the care of your patient. We will continue to follow.   Note: This dictation was prepared with Dragon dictation along with smaller phrase technology. Any transcriptional errors that result from this process are unintentional.  Time spent: 45 minutes  Trinaty Bundrick, MD

## 2016-02-08 NOTE — Progress Notes (Signed)
Hemodialysis start 

## 2016-02-08 NOTE — Progress Notes (Signed)
[facility]  Daily Progress Note   Subjective patient states his left leg is feeling 10 times better he continues to have left hand pain which was worse while he was on dialysis.  Objective Filed Vitals:   02/08/16 1230 02/08/16 1300 02/08/16 1305 02/08/16 1355  BP: 115/75 116/74 112/81 116/72  Pulse: 86 86 90 90  Temp:   98 F (36.7 C) 98.5 F (36.9 C)  TempSrc:   Oral Oral  Resp: Height:      Weight:   100.2 kg (220 lb 14.4 oz)   SpO2:   95% 96%    Intake/Output Summary (Last 24 hours) at 02/08/16 1805 Last data filed at 02/08/16 1749  Gross per 24 hour  Intake  149.3 ml  Output   1500 ml  Net -1350.7 ml    PULM  Normal effort , no use of accessory muscles CV  No JVD, RRR Abd      No distended, nontender VASC  left hand is cool to touch fistula with good thrill good bruit             Left leg is now much less edematous it is less tender  Laboratory CBC    Component Value Date/Time   WBC 6.5 02/08/2016 1042   WBC 12.0* 07/05/2012 0905   HGB 9.5* 02/08/2016 1042   HGB 9.3* 07/05/2012 0905   HCT 29.7* 02/08/2016 1042   HCT 29.4* 07/05/2012 0905   PLT 166 02/08/2016 1042   PLT See comment 07/05/2012 0905    BMET    Component Value Date/Time   NA 139 02/08/2016 1042   NA 138 04/23/2013 0656   K 3.9 02/08/2016 1042   K 3.7 10/29/2013 1059   CL 98* 02/08/2016 1042   CL 102 04/23/2013 0656   CO2 30 02/08/2016 1042   CO2 30 04/23/2013 0656   GLUCOSE 122* 02/08/2016 1042   GLUCOSE 125* 04/23/2013 0656   BUN 18 02/08/2016 1042   BUN 17 04/23/2013 0656   CREATININE 7.29* 02/08/2016 1042   CREATININE 6.52* 04/23/2013 0656   CALCIUM 9.0 02/08/2016 1042   CALCIUM 10.3* 04/23/2013 0656   GFRNONAA 7* 02/08/2016 1042   GFRNONAA 9* 04/23/2013 0656   GFRAA 9* 02/08/2016 1042   GFRAA 10* 04/23/2013 0656    Assessment/Planning:  Charcot foot with cellulitis left leg: Patient is doing well on Zosyn and is improved  significantly. Continue IV antibiotics and elevation.  End-stage renal disease:  Continue dialysis nephrology on consult  Steal syndrome of left hand:  Given the results of the angiogram performed yesterday I will plan to ligate the radial artery distally on Friday. The risks and benefits of been reviewed with the patient all questions answered patient agrees to proceed  [name]  02/08/2016, 6:05 PM

## 2016-02-08 NOTE — Progress Notes (Signed)
Pre-hd tx 

## 2016-02-08 NOTE — Progress Notes (Signed)
Talked to Dr. Wynelle Link about changing patients Renvela to liquid due to patient not being able to swallow the pills.  He gave the order to change it.

## 2016-02-08 NOTE — Progress Notes (Signed)
Hemodialysis completed. 

## 2016-02-08 NOTE — Progress Notes (Signed)
[facility]  ROUNDING NOTE   Subjective:   Seen and examined on hemodialysis. Tolerating treatment well. UF goal of 2.5 litres  Admitted for angiogram for steal syndrome.   Objective:  Vital signs in last 24 hours:  Temp:  [97.6 F (36.4 C)-98.3 F (36.8 C)] 98.3 F (36.8 C) (02/15 0521) Pulse Rate:  [84-88] 86 (02/15 0521) Resp:  [15-23] 20 (02/15 0521) BP: (101-129)/(64-83) 101/67 mmHg (02/15 0521) SpO2:  [92 %-100 %] 96 % (02/15 0521) Weight:  [101.696 kg (224 lb 3.2 oz)-103.874 kg (229 lb)] 101.696 kg (224 lb 3.2 oz) (02/15 0521)  Weight change:  Filed Weights   02/07/16 1147 02/08/16 0521  Weight: 103.874 kg (229 lb) 101.696 kg (224 lb 3.2 oz)    Intake/Output:     Intake/Output this shift:     Physical Exam: General: NAD, laying in bed  Head: Normocephalic, atraumatic. Moist oral mucosal membranes  Eyes: Anicteric, PERRL  Neck: Supple, trachea midline  Lungs:  Clear to auscultation  Heart: Regular rate and rhythm  Abdomen:  Soft, nontender,   Extremities: No peripheral edema.  Neurologic: Nonfocal, moving all four extremities  Skin: No lesions  Access: Left forearm AVF    Basic Metabolic Panel:  Recent Labs Lab 02/07/16 1907 02/08/16 0404  NA  --  139  K  --  3.7  CL  --  100*  CO2  --  30  GLUCOSE  --  98  BUN  --  17  CREATININE 6.59* 6.96*  CALCIUM  --  8.9    Liver Function Tests: No results for input(s): AST, ALT, ALKPHOS, BILITOT, PROT, ALBUMIN in the last 168 hours. No results for input(s): LIPASE, AMYLASE in the last 168 hours. No results for input(s): AMMONIA in the last 168 hours.  CBC:  Recent Labs Lab 02/07/16 1907 02/08/16 0404  WBC 5.9 5.7  HGB 10.6* 9.5*  HCT 32.8* 29.4*  MCV 91.9 92.0  PLT 178 161    Cardiac Enzymes: No results for input(s): CKTOTAL, CKMB, CKMBINDEX, TROPONINI in the last 168 hours.  BNP: Invalid input(s): POCBNP  CBG: No results for input(s): GLUCAP in the last 168  hours.  Microbiology: Results for orders placed or performed during the hospital encounter of 01/04/16  MRSA PCR Screening     Status: None   Collection Time: 01/09/16  2:38 PM  Result Value Ref Range Status   MRSA by PCR NEGATIVE NEGATIVE Final    Comment:        The GeneXpert MRSA Assay (FDA approved for NASAL specimens only), is one component of a comprehensive MRSA colonization surveillance program. It is not intended to diagnose MRSA infection nor to guide or monitor treatment for MRSA infections.     Coagulation Studies: No results for input(s): LABPROT, INR in the last 72 hours.  Urinalysis: No results for input(s): COLORURINE, LABSPEC, PHURINE, GLUCOSEU, HGBUR, BILIRUBINUR, KETONESUR, PROTEINUR, UROBILINOGEN, NITRITE, LEUKOCYTESUR in the last 72 hours.  Invalid input(s): APPERANCEUR    Imaging: US Venous Img Lower Unilateral Left  02/07/2016  CLINICAL DATA:  Left lower extremity swelling. EXAM: LEFT LOWER EXTREMITY VENOUS DOPPLER ULTRASOUND TECHNIQUE: Gray-scale sonography with graded compression, as well as color Doppler and duplex ultrasound, were performed to evaluate the deep venous system from the level of the common femoral vein through the popliteal and proximal calf veins. Spectral Doppler was utilized to evaluate flow at rest and with distal augmentation maneuvers. COMPARISON:  09/08/2015 FINDINGS: Right common femoral vein is patent without  thrombus. Normal compressibility, augmentation and color Doppler flow in the left common femoral vein, left femoral vein and left popliteal vein. The left saphenofemoral junction is patent. Left profunda femoral vein is patent without thrombus. Limited evaluation of the left calf veins due to soft tissue swelling. There is subcutaneous edema in the left leg. Prominent inguinal lymph nodes bilaterally. IMPRESSION: Negative for deep venous thrombosis in left lower extremity. Prominent bilateral inguinal lymph nodes. Electronically  Signed   By: Richarda Overlie M.D.   On: 02/07/2016 13:40     Medications:     . amiodarone  400 mg Oral Daily  . aspirin EC  81 mg Oral Daily  . carvedilol  3.125 mg Oral BID WC  . docusate sodium  100 mg Oral BID  . heparin  5,000 Units Subcutaneous 3 times per day  . multivitamin  1 tablet Oral QHS  . pantoprazole  40 mg Oral Daily  . piperacillin-tazobactam (ZOSYN)  IV  3.375 g Intravenous Q12H  . sevelamer carbonate  1,600 mg Oral BID BM  . sevelamer carbonate  3,200 mg Oral TID WC  . simvastatin  20 mg Oral QHS   acetaminophen **OR** acetaminophen, HYDROcodone-acetaminophen, insulin aspart, magnesium hydroxide, morphine injection, ondansetron **OR** ondansetron (ZOFRAN) IV, sodium phosphate, sorbitol, traMADol  Assessment/ Plan:  [name] is a 60 y.o. black  male with ESRD on hemodialysis, hypertension, hyperlipidemia, diabetes, atrial fibrillation  MWF [facility] [address]   1. End-stage renal disease: seen and examined on hemodialysis. Tolerating treatment well. Continue MWF schedule.  UF goal of 2.5 litres  2. Anemia of chronic kidney disease: hemoglobin of 9.5 - epo with HD treatment  3. Secondary hyperparathyroidism:  - Continue Renvela for phosphorous binding.  4. Hypertension with atrial fibrillation: blood pressure at goal - carvedilol.     LOS: 1 [name] [name] 2/15/201710:15 AM

## 2016-02-08 NOTE — Progress Notes (Signed)
Initial Nutrition Assessment      INTERVENTION:  Meals and snacks: Cater to pt preferences within diet restriction   NUTRITION DIAGNOSIS:    (none at this time) related to   as evidenced by  .    GOAL:   Patient will meet greater than or equal to 90% of their needs    MONITOR:    (Energy intake, Electrolyte and renal profile)  REASON FOR ASSESSMENT:    (dialysis pt)    ASSESSMENT:      Pt admitted for angiogram for steal syndrome  Past Medical History  Diagnosis Date  . Renal disorder   . Diabetes mellitus without complication (HCC)   . Hypertension   . Hyperlipidemia   . ESRD (end stage renal disease) (HCC)   . Secondary hyperparathyroidism (HCC)   . GERD (gastroesophageal reflux disease)     Current Nutrition: limited information and pt not in room at time of visit this am  Food/Nutrition-Related History: Per [facility]ST normal intake prior to admission   Scheduled Medications:  . amiodarone  400 mg Oral Daily  . aspirin EC  81 mg Oral Daily  . carvedilol  3.125 mg Oral BID WC  . docusate sodium  100 mg Oral BID  . heparin  5,000 Units Subcutaneous 3 times per day  . insulin aspart  0-5 Units Subcutaneous QHS  . insulin aspart  0-9 Units Subcutaneous TID WC  . multivitamin  1 tablet Oral QHS  . pantoprazole  40 mg Oral Daily  . piperacillin-tazobactam (ZOSYN)  IV  3.375 g Intravenous Q12H  . sevelamer carbonate  1,600 mg Oral BID BM  . sevelamer carbonate  3,200 mg Oral TID WC  . simvastatin  20 mg Oral QHS       Electrolyte/Renal Profile and Glucose Profile:   Recent Labs Lab 02/07/16 1907 02/08/16 0404 02/08/16 1042  NA  --  139 139  K  --  3.7 3.9  CL  --  100* 98*  CO2  --  30 30  BUN  --  17 18  CREATININE 6.59* 6.96* 7.29*  CALCIUM  --  8.9 9.0  PHOS  --   --  3.8  GLUCOSE  --  98 122*   Protein Profile:  Recent Labs Lab 02/08/16 1042  ALBUMIN 2.8*    Gastrointestinal Profile: Last BM: WDL    Weight Change: 5% wt loss  noted in the last 5 months    Diet Order:  Diet renal/carb modified with fluid restriction Diet-HS Snack?: Nothing; Room service appropriate?: Yes; Fluid consistency:: Thin  Skin:   reviewed   Height:   Ht Readings from Last 1 Encounters:  02/07/16 6' (1.829 m)    Weight:   Wt Readings from Last 1 Encounters:  02/08/16 224 lb 3.2 oz (101.696 kg)    Ideal Body Weight:     BMI:  Body mass index is 30.4 kg/(m^2).  EDUCATION NEEDS:   No education needs identified at this time  LOW Care Level  Erica Richwine B. Freida Busman, RD, LDN [phone] (pager) Weekend/On-Call pager [phone])

## 2016-02-09 ENCOUNTER — Encounter: Payer: Self-pay | Admitting: Vascular Surgery

## 2016-02-09 LAB — GLUCOSE, CAPILLARY
GLUCOSE-CAPILLARY: 82 mg/dL (ref 65–99)
GLUCOSE-CAPILLARY: 86 mg/dL (ref 65–99)
GLUCOSE-CAPILLARY: 112 mg/dL — AB (ref 65–99)
Glucose-Capillary: 88 mg/dL (ref 65–99)

## 2016-02-09 MED ORDER — INSULIN ASPART 100 UNIT/ML ~~LOC~~ SOLN
1.0000 [IU] | Freq: Three times a day (TID) | SUBCUTANEOUS | Status: DC
  Administered 2016-02-11: 1 [IU] via SUBCUTANEOUS
  Filled 2016-02-09: qty 1

## 2016-02-09 NOTE — Progress Notes (Signed)
[facility] -  at [facility]                                                                                                                                                                                            Patient Demographics   [name] [name], is a 60 y.o. male, DOB - 08-27-1956, ZOX:[id number]  Admit date - 02/07/2016   Admitting Physician [name], MD  Outpatient Primary MD for the patient is Clydie Braun, MD   LOS - 2  Subjective: Patient feels okay denies any complaints. States that he will feel better if he is discharged home on oxygen     Review of Systems:   CONSTITUTIONAL: No documented fever. No fatigue, weakness. No weight gain, no weight loss.  EYES: No blurry or double vision.  ENT: No tinnitus. No postnasal drip. No redness of the oropharynx.  RESPIRATORY: No cough, no wheeze, no hemoptysis. No dyspnea.  CARDIOVASCULAR: No chest pain. No orthopnea. No palpitations. No syncope.  GASTROINTESTINAL: No nausea, no vomiting or diarrhea. No abdominal pain. No melena or hematochezia.  GENITOURINARY: No dysuria or hematuria.  ENDOCRINE: No polyuria or nocturia. No heat or cold intolerance.  HEMATOLOGY: No anemia. No bruising. No bleeding.  INTEGUMENTARY: No rashes. No lesions.  MUSCULOSKELETAL: No arthritis. Left leg swelling Neurologic no numbness, tingling, or ataxia. No seizure-type activity.  PSYCHIATRIC: No anxiety. No insomnia. No ADD.    Vitals:   Filed Vitals:   02/09/16 0659 02/09/16 0820 02/09/16 1040 02/09/16 1329  BP:  118/76  99/63  Pulse:  87 88 84  Temp:  98.2 F (36.8 C)  98.2 F (36.8 C)  TempSrc:  Oral  Oral  Resp:  18  18  Height:      Weight: 105.824 kg (233 lb 4.8 oz)     SpO2:  100%  97%    Wt Readings from Last 3 Encounters:  02/09/16 105.824 kg (233 lb 4.8 oz)  01/10/16 104.01 kg (229 lb 4.8 oz)  11/29/15 106.595 kg (235 lb)     Intake/Output Summary (Last 24 hours) at 02/09/16  1358 Last data filed at 02/09/16 0731  Gross per 24 hour  Intake  389.3 ml  Output      0 ml  Net  389.3 ml    Physical Exam:   GENERAL: Pleasant-appearing in no apparent distress.  HEAD, EYES, EARS, NOSE AND THROAT: Atraumatic, normocephalic. Extraocular muscles are intact. Pupils equal and reactive to light. Sclerae anicteric. No conjunctival injection. No oro-pharyngeal erythema.  NECK: Supple. There is no jugular venous distention. No bruits, no lymphadenopathy, no thyromegaly.  HEART: Regular rate and rhythm,. No murmurs, no rubs, no clicks.  LUNGS: Clear to auscultation bilaterally. No rales or rhonchi. No wheezes.  ABDOMEN: Soft, flat, nontender, nondistended. Has good bowel sounds. No hepatosplenomegaly appreciated.  EXTREMITIES: No evidence of any cyanosis, clubbing, or peripheral edema.  +2 pedal and radial pulses bilaterally. Left leg less edema  NEUROLOGIC: The patient is alert, awake, and oriented x3 with no focal motor or sensory deficits appreciated bilaterally.  SKIN: Moist and warm with no rashes appreciated.  Psych: Not anxious, depressed LN: No inguinal LN enlargement    Antibiotics   Anti-infectives    Start     Dose/Rate Route Frequency Ordered Stop   02/07/16 2200  piperacillin-tazobactam (ZOSYN) IVPB 3.375 g     3.375 g 12.5 mL/hr over 240 Minutes Intravenous Every 12 hours 02/07/16 1937        Medications   Scheduled Meds: . amiodarone  400 mg Oral Daily  . aspirin EC  81 mg Oral Daily  . calcium acetate (Phos Binder)  1,600 mg Oral BID BM  . calcium acetate (Phos Binder)  3,200 mg Oral TID WC  . carvedilol  3.125 mg Oral BID WC  . docusate sodium  100 mg Oral BID  . heparin  5,000 Units Subcutaneous 3 times per day  . insulin aspart  0-5 Units Subcutaneous QHS  . insulin aspart  0-9 Units Subcutaneous TID WC  . multivitamin  1 tablet Oral QHS  . pantoprazole  40 mg Oral Daily  . piperacillin-tazobactam (ZOSYN)  IV  3.375 g Intravenous Q12H  .  simvastatin  20 mg Oral QHS   Continuous Infusions:  PRN Meds:.acetaminophen **OR** acetaminophen, HYDROcodone-acetaminophen, insulin aspart, magnesium hydroxide, morphine injection, ondansetron **OR** ondansetron (ZOFRAN) IV, sodium phosphate, sorbitol, traMADol   Data Review:   Micro Results No results found for this or any previous visit (from the past 240 hour(s)).  Radiology Reports US Venous Img Lower Unilateral Left  02/07/2016  CLINICAL DATA:  Left lower extremity swelling. EXAM: LEFT LOWER EXTREMITY VENOUS DOPPLER ULTRASOUND TECHNIQUE: Gray-scale sonography with graded compression, as well as color Doppler and duplex ultrasound, were performed to evaluate the deep venous system from the level of the common femoral vein through the popliteal and proximal calf veins. Spectral Doppler was utilized to evaluate flow at rest and with distal augmentation maneuvers. COMPARISON:  09/08/2015 FINDINGS: Right common femoral vein is patent without thrombus. Normal compressibility, augmentation and color Doppler flow in the left common femoral vein, left femoral vein and left popliteal vein. The left saphenofemoral junction is patent. Left profunda femoral vein is patent without thrombus. Limited evaluation of the left calf veins due to soft tissue swelling. There is subcutaneous edema in the left leg. Prominent inguinal lymph nodes bilaterally. IMPRESSION: Negative for deep venous thrombosis in left lower extremity. Prominent bilateral inguinal lymph nodes. Electronically Signed   By: Richarda Overlie M.D.   On: 02/07/2016 13:40     CBC  Recent Labs Lab 02/07/16 1907 02/08/16 0404 02/08/16 1042  WBC 5.9 5.7 6.5  HGB 10.6* 9.5* 9.5*  HCT 32.8* 29.4* 29.7*  PLT 178 161 166  MCV 91.9 92.0 89.9  MCH 29.7 29.8 28.9  MCHC 32.4 32.4 32.2  RDW 18.2* 17.6* 17.7*  LYMPHSABS  --   --  0.8*  MONOABS  --   --  0.5  EOSABS  --   --  0.2  BASOSABS  --   --  0.1    Chemistries  Recent Labs Lab  02/07/16 1907 02/08/16 0404 02/08/16 1042  NA  --  139 139  K  --  3.7 3.9  CL  --  100* 98*  CO2  --  30 30  GLUCOSE  --  98 122*  BUN  --  17 18  CREATININE 6.59* 6.96* 7.29*  CALCIUM  --  8.9 9.0   ------------------------------------------------------------------------------------------------------------------ estimated creatinine clearance is 13.9 mL/min (by C-G formula based on Cr of 7.29). ------------------------------------------------------------------------------------------------------------------ No results for input(s): HGBA1C in the last 72 hours. ------------------------------------------------------------------------------------------------------------------ No results for input(s): CHOL, HDL, LDLCALC, TRIG, CHOLHDL, LDLDIRECT in the last 72 hours. ------------------------------------------------------------------------------------------------------------------ No results for input(s): TSH, T4TOTAL, T3FREE, THYROIDAB in the last 72 hours.  Invalid input(s): FREET3 ------------------------------------------------------------------------------------------------------------------ No results for input(s): VITAMINB12, FOLATE, FERRITIN, TIBC, IRON, RETICCTPCT in the last 72 hours.  Coagulation profile No results for input(s): INR, PROTIME in the last 168 hours.  No results for input(s): DDIMER in the last 72 hours.  Cardiac Enzymes No results for input(s): CKMB, TROPONINI, MYOGLOBIN in the last 168 hours.  Invalid input(s): CK ------------------------------------------------------------------------------------------------------------------ Invalid input(s): POCBNP    Assessment & Plan   60 year old year-old male with end-stage renal disease and recent upper GI bleed admitted for angiogram for stealsyndrome.  1. End-stage renal disease on hemodialysis: Continue dialysis as per renal team.  2. Cardiomyopathy EF of 20% with atrial fibrillation: Continue  amiodarone and Coreg. No cardiac symptoms   3. Hyperlipidemia: Continue simvastatin  4. Diabetes: Continue sliding scale insulin and renal/diabetic diet.blood glucose stable  5. Charcot's foot with cellulitis of the left leg continue Zosyn as per vascular  6. His steal syndrome of the left hand treatment per vascular surgery       Code Status Orders        Start     Ordered   02/07/16 1532  Full code   Continuous     02/07/16 1535    Code Status History    Date Active Date Inactive Code Status Order ID Comments User Context   01/04/2016  7:51 PM 01/10/2016  6:01 PM Full Code [id number]  Houston Siren, MD Inpatient              DVT Prophylaxis   Heparin   Lab Results  Component Value Date   PLT 166 02/08/2016     Time Spent in minutes    Auburn Bilberry M.D on 02/09/2016 at 1:58 PM  Between 7am to 6pm - Pager - [phone]  After 6pm go to [email or web address] - password [email or web address] [facility]  [facility]   Office  [phone]

## 2016-02-09 NOTE — Care Management Important Message (Signed)
Important Message  Patient Details  Name: [name] [name] MRN: [id number] Date of Birth: 1956/11/10   Medicare Important Message Given:  Yes    Olegario Messier A Jenese Mischke 02/09/2016, 2:26 PM

## 2016-02-09 NOTE — Progress Notes (Signed)
[facility]  Daily Progress Note   Subjective patient states his left leg continues to feel better and findings the TEDs hose to be very comfortable and helpful he continues to have left hand pain which was worse while he was on dialysis.  Objective Filed Vitals:   02/09/16 0539 02/09/16 0659 02/09/16 0820 02/09/16 1040  BP: 111/67  118/76   Pulse: 84  87 88  Temp: 98.2 F (36.8 C)  98.2 F (36.8 C)   TempSrc: Oral  Oral   Resp: 16  18   Height:      Weight:  105.824 kg (233 lb 4.8 oz)    SpO2: 100%  100%     Intake/Output Summary (Last 24 hours) at 02/09/16 1321 Last data filed at 02/09/16 0731  Gross per 24 hour  Intake  389.3 ml  Output      0 ml  Net  389.3 ml    PULM  Normal effort , no use of accessory muscles CV  No JVD, RRR Abd      No distended, nontender VASC  left hand is cool to touch fistula with good thrill good bruit             Left leg is now much less edematous it is less tender  Laboratory CBC    Component Value Date/Time   WBC 6.5 02/08/2016 1042   WBC 12.0* 07/05/2012 0905   HGB 9.5* 02/08/2016 1042   HGB 9.3* 07/05/2012 0905   HCT 29.7* 02/08/2016 1042   HCT 29.4* 07/05/2012 0905   PLT 166 02/08/2016 1042   PLT See comment 07/05/2012 0905    BMET    Component Value Date/Time   NA 139 02/08/2016 1042   NA 138 04/23/2013 0656   K 3.9 02/08/2016 1042   K 3.7 10/29/2013 1059   CL 98* 02/08/2016 1042   CL 102 04/23/2013 0656   CO2 30 02/08/2016 1042   CO2 30 04/23/2013 0656   GLUCOSE 122* 02/08/2016 1042   GLUCOSE 125* 04/23/2013 0656   BUN 18 02/08/2016 1042   BUN 17 04/23/2013 0656   CREATININE 7.29* 02/08/2016 1042   CREATININE 6.52* 04/23/2013 0656   CALCIUM 9.0 02/08/2016 1042   CALCIUM 10.3* 04/23/2013 0656   GFRNONAA 7* 02/08/2016 1042   GFRNONAA 9* 04/23/2013 0656   GFRAA 9* 02/08/2016 1042   GFRAA 10* 04/23/2013 0656    Assessment/Planning:  Charcot foot with cellulitis left leg: Patient is  doing well on Zosyn and is improved significantly. Continue IV antibiotics and elevation.  End-stage renal disease:  Continue dialysis nephrology on consult I discussed early dialysis tomorrow with Dr. Wynelle Link and subsequently will plan for ligation of the distal radial artery  Steal syndrome of left hand:  Given the results of the angiogram performed yesterday I will plan to ligate the radial artery distally on Friday. The risks and benefits of been reviewed with the patient all questions answered patient agrees to proceed  [name]  02/09/2016, 1:21 PM

## 2016-02-09 NOTE — Progress Notes (Signed)
[facility]  ROUNDING NOTE   Subjective:   Hemodialysis treatment yesterday. Tolerated treatment well. UF of  1.5  Objective:  Vital signs in last 24 hours:  Temp:  [98 F (36.7 C)-99.7 F (37.6 C)] 98.2 F (36.8 C) (02/16 0820) Pulse Rate:  [61-96] 87 (02/16 0820) Resp:  [16-25] 18 (02/16 0820) BP: (94-118)/(49-81) 118/76 mmHg (02/16 0820) SpO2:  [94 %-100 %] 100 % (02/16 0820) Weight:  [100.2 kg (220 lb 14.4 oz)-105.824 kg (233 lb 4.8 oz)] 105.824 kg (233 lb 4.8 oz) (02/16 0659)  Weight change: -2.178 kg (-4 lb 12.8 oz) Filed Weights   02/08/16 0950 02/08/16 1305 02/09/16 0659  Weight: 101.696 kg (224 lb 3.2 oz) 100.2 kg (220 lb 14.4 oz) 105.824 kg (233 lb 4.8 oz)    Intake/Output: I/O last 3 completed shifts: In: 149.3 [P.O.:120; IV Piggyback:29.3] Out: 1500 [Other:1500]   Intake/Output this shift:     Physical Exam: General: NAD, laying in bed  Head: Normocephalic, atraumatic. Moist oral mucosal membranes  Eyes: Anicteric, PERRL  Neck: Supple, trachea midline  Lungs:  Clear to auscultation  Heart: Regular rate and rhythm  Abdomen:  Soft, nontender,   Extremities: No peripheral edema.  Neurologic: Nonfocal, moving all four extremities  Skin: No lesions  Access: Left forearm AVF    Basic Metabolic Panel:  Recent Labs Lab 02/07/16 1907 02/08/16 0404 02/08/16 1042  NA  --  139 139  K  --  3.7 3.9  CL  --  100* 98*  CO2  --  30 30  GLUCOSE  --  98 122*  BUN  --  17 18  CREATININE 6.59* 6.96* 7.29*  CALCIUM  --  8.9 9.0  PHOS  --   --  3.8    Liver Function Tests:  Recent Labs Lab 02/08/16 1042  ALBUMIN 2.8*   No results for input(s): LIPASE, AMYLASE in the last 168 hours. No results for input(s): AMMONIA in the last 168 hours.  CBC:  Recent Labs Lab 02/07/16 1907 02/08/16 0404 02/08/16 1042  WBC 5.9 5.7 6.5  NEUTROABS  --   --  5.0  HGB 10.6* 9.5* 9.5*  HCT 32.8* 29.4* 29.7*  MCV 91.9 92.0 89.9  PLT 178 161 166     Cardiac Enzymes: No results for input(s): CKTOTAL, CKMB, CKMBINDEX, TROPONINI in the last 168 hours.  BNP: Invalid input(s): POCBNP  CBG:  Recent Labs Lab 02/08/16 1634 02/08/16 2123 02/09/16 0730  GLUCAP 107* 112* 88    Microbiology: Results for orders placed or performed during the hospital encounter of 01/04/16  MRSA PCR Screening     Status: None   Collection Time: 01/09/16  2:38 PM  Result Value Ref Range Status   MRSA by PCR NEGATIVE NEGATIVE Final    Comment:        The GeneXpert MRSA Assay (FDA approved for NASAL specimens only), is one component of a comprehensive MRSA colonization surveillance program. It is not intended to diagnose MRSA infection nor to guide or monitor treatment for MRSA infections.     Coagulation Studies: No results for input(s): LABPROT, INR in the last 72 hours.  Urinalysis: No results for input(s): COLORURINE, LABSPEC, PHURINE, GLUCOSEU, HGBUR, BILIRUBINUR, KETONESUR, PROTEINUR, UROBILINOGEN, NITRITE, LEUKOCYTESUR in the last 72 hours.  Invalid input(s): APPERANCEUR    Imaging: US Venous Img Lower Unilateral Left  02/07/2016  CLINICAL DATA:  Left lower extremity swelling. EXAM: LEFT LOWER EXTREMITY VENOUS DOPPLER ULTRASOUND TECHNIQUE: Gray-scale sonography with graded compression, as  well as color Doppler and duplex ultrasound, were performed to evaluate the deep venous system from the level of the common femoral vein through the popliteal and proximal calf veins. Spectral Doppler was utilized to evaluate flow at rest and with distal augmentation maneuvers. COMPARISON:  09/08/2015 FINDINGS: Right common femoral vein is patent without thrombus. Normal compressibility, augmentation and color Doppler flow in the left common femoral vein, left femoral vein and left popliteal vein. The left saphenofemoral junction is patent. Left profunda femoral vein is patent without thrombus. Limited evaluation of the left calf veins due to soft  tissue swelling. There is subcutaneous edema in the left leg. Prominent inguinal lymph nodes bilaterally. IMPRESSION: Negative for deep venous thrombosis in left lower extremity. Prominent bilateral inguinal lymph nodes. Electronically Signed   By: Richarda Overlie M.D.   On: 02/07/2016 13:40     Medications:     . amiodarone  400 mg Oral Daily  . aspirin EC  81 mg Oral Daily  . calcium acetate (Phos Binder)  1,600 mg Oral BID BM  . calcium acetate (Phos Binder)  3,200 mg Oral TID WC  . carvedilol  3.125 mg Oral BID WC  . docusate sodium  100 mg Oral BID  . heparin  5,000 Units Subcutaneous 3 times per day  . insulin aspart  0-5 Units Subcutaneous QHS  . insulin aspart  0-9 Units Subcutaneous TID WC  . multivitamin  1 tablet Oral QHS  . pantoprazole  40 mg Oral Daily  . piperacillin-tazobactam (ZOSYN)  IV  3.375 g Intravenous Q12H  . simvastatin  20 mg Oral QHS   acetaminophen **OR** acetaminophen, HYDROcodone-acetaminophen, insulin aspart, magnesium hydroxide, morphine injection, ondansetron **OR** ondansetron (ZOFRAN) IV, sodium phosphate, sorbitol, traMADol  Assessment/ Plan:  [name] is a 60 y.o. black  male with ESRD on hemodialysis, hypertension, hyperlipidemia, diabetes, atrial fibrillation  MWF [facility] [address]   1. End-stage renal disease: tolerated dialysis treatment well.  Continue MWF schedule.  Complication of dialysis access: steal syndrome: scheduled for ligation tomorrow by Dr. Gilda Crease after his scheduled dialysis treatment.   2. Anemia of chronic kidney disease: hemoglobin of 9.5 - epo with HD treatment  3. Secondary hyperparathyroidism:  - Continue Renvela for phosphorous binding.  4. Hypertension with atrial fibrillation: blood pressure at goal - carvedilol.     LOS: 2 Salena Ortlieb 2/16/20179:49 AM

## 2016-02-10 ENCOUNTER — Encounter
Admission: RE | Disposition: A | Discharge status: Home / Self Care | Payer: Self-pay | Source: Ambulatory Visit | Attending: Vascular Surgery

## 2016-02-10 ENCOUNTER — Inpatient Hospital Stay: Payer: Medicare Other | Admitting: Anesthesiology

## 2016-02-10 ENCOUNTER — Encounter: Payer: Self-pay | Admitting: Anesthesiology

## 2016-02-10 DIAGNOSIS — I4891 Unspecified atrial fibrillation: Secondary | ICD-10-CM | POA: Diagnosis not present

## 2016-02-10 HISTORY — PX: LIGATION OF ARTERIOVENOUS  FISTULA: SHX5948

## 2016-02-10 LAB — CBC
HEMATOCRIT: 29.6 % — AB (ref 40.0–52.0)
HEMOGLOBIN: 9.7 g/dL — AB (ref 13.0–18.0)
MCH: 29.3 pg (ref 26.0–34.0)
MCHC: 32.7 g/dL (ref 32.0–36.0)
MCV: 89.6 fL (ref 80.0–100.0)
Platelets: 146 10*3/uL — ABNORMAL LOW (ref 150–440)
RBC: 3.3 MIL/uL — ABNORMAL LOW (ref 4.40–5.90)
RDW: 18.1 % — AB (ref 11.5–14.5)
WBC: 7.3 10*3/uL (ref 3.8–10.6)

## 2016-02-10 LAB — RENAL FUNCTION PANEL
ALBUMIN: 2.6 g/dL — AB (ref 3.5–5.0)
ANION GAP: 11 (ref 5–15)
BUN: 20 mg/dL (ref 6–20)
CO2: 30 mmol/L (ref 22–32)
Calcium: 8.9 mg/dL (ref 8.9–10.3)
Chloride: 98 mmol/L — ABNORMAL LOW (ref 101–111)
Creatinine, Ser: 7.63 mg/dL — ABNORMAL HIGH (ref 0.61–1.24)
GFR calc Af Amer: 8 mL/min — ABNORMAL LOW (ref >60)
GFR calc non Af Amer: 7 mL/min — ABNORMAL LOW (ref >60)
GLUCOSE: 93 mg/dL (ref 65–99)
PHOSPHORUS: 3.9 mg/dL (ref 2.5–4.6)
POTASSIUM: 3.6 mmol/L (ref 3.5–5.1)
Sodium: 139 mmol/L (ref 135–145)

## 2016-02-10 LAB — TROPONIN I: TROPONIN I: 0.04 ng/mL — AB (ref <0.031)

## 2016-02-10 LAB — BASIC METABOLIC PANEL
ANION GAP: 14 (ref 5–15)
BUN: 11 mg/dL (ref 6–20)
CHLORIDE: 99 mmol/L — AB (ref 101–111)
CO2: 30 mmol/L (ref 22–32)
Calcium: 9.4 mg/dL (ref 8.9–10.3)
Creatinine, Ser: 5.03 mg/dL — ABNORMAL HIGH (ref 0.61–1.24)
GFR calc Af Amer: 13 mL/min — ABNORMAL LOW (ref >60)
GFR calc non Af Amer: 11 mL/min — ABNORMAL LOW (ref >60)
GLUCOSE: 84 mg/dL (ref 65–99)
POTASSIUM: 3.5 mmol/L (ref 3.5–5.1)
Sodium: 143 mmol/L (ref 135–145)

## 2016-02-10 LAB — DIFFERENTIAL
BASOS PCT: 1 %
Basophils Absolute: 0 10*3/uL (ref 0–0.1)
Eosinophils Absolute: 0.1 10*3/uL (ref 0–0.7)
Eosinophils Relative: 1 %
LYMPHS ABS: 0.8 10*3/uL — AB (ref 1.0–3.6)
LYMPHS PCT: 11 %
MONO ABS: 0.9 10*3/uL (ref 0.2–1.0)
MONOS PCT: 12 %
NEUTROS ABS: 5.4 10*3/uL (ref 1.4–6.5)
Neutrophils Relative %: 75 %

## 2016-02-10 LAB — GLUCOSE, CAPILLARY
Glucose-Capillary: 82 mg/dL (ref 65–99)
Glucose-Capillary: 77 mg/dL (ref 65–99)

## 2016-02-10 LAB — MAGNESIUM: Magnesium: 1.8 mg/dL (ref 1.7–2.4)

## 2016-02-10 SURGERY — LIGATION OF ARTERIOVENOUS  FISTULA
Anesthesia: General

## 2016-02-10 MED ORDER — DIGOXIN 0.25 MG/ML IJ SOLN
0.5000 mg | INTRAMUSCULAR | Status: AC
  Administered 2016-02-10: 0.5 mg via INTRAVENOUS
  Filled 2016-02-10: qty 2

## 2016-02-10 MED ORDER — EPOETIN ALFA 10000 UNIT/ML IJ SOLN
10000.0000 [IU] | Freq: Once | INTRAMUSCULAR | Status: AC
  Administered 2016-02-10: 10000 [IU] via INTRAVENOUS

## 2016-02-10 MED ORDER — ESMOLOL HCL 100 MG/10ML IV SOLN
INTRAVENOUS | Status: DC | PRN
  Administered 2016-02-10 (×3): 10 mg via INTRAVENOUS

## 2016-02-10 MED ORDER — DIGOXIN 0.25 MG/ML IJ SOLN
0.1250 mg | Freq: Every day | INTRAMUSCULAR | Status: DC
  Administered 2016-02-11: 0.125 mg via INTRAVENOUS
  Filled 2016-02-10: qty 2

## 2016-02-10 MED ORDER — DIGOXIN 0.25 MG/ML IJ SOLN
0.2500 mg | INTRAMUSCULAR | Status: AC
  Administered 2016-02-10: 0.25 mg via INTRAVENOUS
  Filled 2016-02-10: qty 2

## 2016-02-10 MED ORDER — MIDAZOLAM HCL 2 MG/2ML IJ SOLN
INTRAMUSCULAR | Status: DC | PRN
  Administered 2016-02-10: 2 mg via INTRAVENOUS

## 2016-02-10 MED ORDER — DIGOXIN 0.25 MG/ML IJ SOLN
0.2500 mg | Freq: Once | INTRAMUSCULAR | Status: AC
  Administered 2016-02-11: 0.25 mg via INTRAVENOUS
  Filled 2016-02-10: qty 2

## 2016-02-10 MED ORDER — SODIUM CHLORIDE 0.9 % IV SOLN
INTRAVENOUS | Status: DC | PRN
  Administered 2016-02-10: 17:00:00 via INTRAVENOUS

## 2016-02-10 SURGICAL SUPPLY — 45 items
BAG DECANTER FOR FLEXI CONT (MISCELLANEOUS) ×4 IMPLANT
BLADE SURG SZ11 CARB STEEL (BLADE) ×4 IMPLANT
BOOT SUTURE AID YELLOW STND (SUTURE) IMPLANT
CANISTER SUCT 1200ML W/VALVE (MISCELLANEOUS) ×4 IMPLANT
CHLORAPREP W/TINT 26ML (MISCELLANEOUS) ×4 IMPLANT
CLIP TI MEDIUM 6 (CLIP) IMPLANT
CLIP TI WIDE RED SMALL 6 (CLIP) IMPLANT
DRESSING SURGICEL FIBRLLR 1X2 (HEMOSTASIS) ×2 IMPLANT
DRSG SURGICEL FIBRILLAR 1X2 (HEMOSTASIS) ×4
ELECT CAUTERY BLADE 6.4 (BLADE) ×4 IMPLANT
ELECT REM PT RETURN 9FT ADLT (ELECTROSURGICAL) ×4
ELECTRODE REM PT RTRN 9FT ADLT (ELECTROSURGICAL) ×2 IMPLANT
GLOVE SURG SYN 8.0 (GLOVE) ×4 IMPLANT
GOWN STRL REUS W/ TWL LRG LVL3 (GOWN DISPOSABLE) ×2 IMPLANT
GOWN STRL REUS W/ TWL XL LVL3 (GOWN DISPOSABLE) ×2 IMPLANT
GOWN STRL REUS W/TWL LRG LVL3 (GOWN DISPOSABLE) ×2
GOWN STRL REUS W/TWL XL LVL3 (GOWN DISPOSABLE) ×2
IV NS 500ML (IV SOLUTION) ×2
IV NS 500ML BAXH (IV SOLUTION) ×2 IMPLANT
KIT RM TURNOVER STRD PROC AR (KITS) ×4 IMPLANT
LABEL OR SOLS (LABEL) ×4 IMPLANT
LIQUID BAND (GAUZE/BANDAGES/DRESSINGS) ×4 IMPLANT
LOOP RED MAXI  1X406MM (MISCELLANEOUS)
LOOP VESSEL MAXI 1X406 RED (MISCELLANEOUS) IMPLANT
LOOP VESSEL MINI 0.8X406 BLUE (MISCELLANEOUS) IMPLANT
LOOPS BLUE MINI 0.8X406MM (MISCELLANEOUS)
NEEDLE FILTER BLUNT 18X 1/2SAF (NEEDLE) ×2
NEEDLE FILTER BLUNT 18X1 1/2 (NEEDLE) ×2 IMPLANT
PACK EXTREMITY ARMC (MISCELLANEOUS) ×4 IMPLANT
PAD PREP 24X41 OB/GYN DISP (PERSONAL CARE ITEMS) ×4 IMPLANT
STOCKINETTE STRL 4IN 9604848 (GAUZE/BANDAGES/DRESSINGS) ×4 IMPLANT
SUT ETHIBOND 0 36 GRN (SUTURE) IMPLANT
SUT MNCRL+ 5-0 UNDYED PC-3 (SUTURE) ×2 IMPLANT
SUT MONOCRYL 5-0 (SUTURE) ×2
SUT PROLENE 6 0 BV (SUTURE) IMPLANT
SUT SILK 2 0 (SUTURE)
SUT SILK 2-0 18XBRD TIE 12 (SUTURE) IMPLANT
SUT SILK 3 0 (SUTURE)
SUT SILK 3-0 18XBRD TIE 12 (SUTURE) IMPLANT
SUT SILK 4 0 (SUTURE)
SUT SILK 4-0 18XBRD TIE 12 (SUTURE) IMPLANT
SUT VIC AB 3-0 SH 27 (SUTURE) ×2
SUT VIC AB 3-0 SH 27X BRD (SUTURE) ×2 IMPLANT
SYR 20CC LL (SYRINGE) ×4 IMPLANT
SYR 3ML LL SCALE MARK (SYRINGE) ×4 IMPLANT

## 2016-02-10 NOTE — Progress Notes (Signed)
Patient was being prepared for surgery when he went into a wide complex tachycardia and became hypotensive. Surgery is canceled for today and he has been moved to the intensive care unit cardiology has been asked to evaluate him

## 2016-02-10 NOTE — Consult Note (Signed)
Reason for Consult: Tachycardia hypotension wide-complex tachycardia Referring Physician: Dr. Lucky Cowboy, Dr. Benjie Karvonen hospitalist  [name] [name] is an 60 y.o. male.  HPI: Patient presents with arm infection preop for surgery with vascular was undergoing anesthesia and had an episode of hypotension wide complex tachycardia so procedure was discontinued patient was transferred to the unit to help with hypotension symptoms. Patient denied any chest pain has had a history of atrial fibrillation as well as complex tachycardias in the past is on amiodarone patient has had tachycardia currently is on Coreg, complains of mild shortness of breath and states that symptoms started about 3 or 4 days ago surgery was postponed because of rhythm he has a known history of cardiomyopathy ejection fraction around 20-25%. Patient states he feels about the same and it is questionable whether he can go home and come back for his arm surgery Past Medical History  Diagnosis Date  . Renal disorder   . Diabetes mellitus without complication ([place])   . Hypertension   . Hyperlipidemia   . ESRD (end stage renal disease) ([place])   . Secondary hyperparathyroidism ([place])   . GERD (gastroesophageal reflux disease)     Past Surgical History  Procedure Laterality Date  . Peripheral vascular catheterization N/A 11/29/2015    Procedure: A/V Shuntogram/Fistulagram;  Surgeon: Katha Cabal, MD;  Location: [facility];  Service: Cardiovascular;  Laterality: N/A;  . Peripheral vascular catheterization N/A 11/29/2015    Procedure: A/V Shunt Intervention;  Surgeon: Katha Cabal, MD;  Location: [facility];  Service: Cardiovascular;  Laterality: N/A;  . Peripheral vascular catheterization Right 02/07/2016    Procedure: Upper Extremity Angiography;  Surgeon: Katha Cabal, MD;  Location: [facility];  Service: Cardiovascular;  Laterality: Right;  . Peripheral vascular catheterization  02/07/2016    Procedure: Upper  Extremity Intervention;  Surgeon: Katha Cabal, MD;  Location: [facility];  Service: Cardiovascular;;  . Peripheral vascular catheterization N/A 02/07/2016    Procedure: Aortic Arch Angiography;  Surgeon: Katha Cabal, MD;  Location: [facility];  Service: Cardiovascular;  Laterality: N/A;    Family History  Problem Relation Age of Onset  . Lung cancer Father   . Heart attack Mother     Social History:  reports that he has never smoked. He does not have any smokeless tobacco history on file. He reports that he does not drink alcohol or use illicit drugs.  Allergies: No Known Allergies  Medications: I have reviewed the patient's current medications.  Results for orders placed or performed during the hospital encounter of 02/07/16 (from the past 48 hour(s))  Glucose, capillary     Status: Abnormal   Collection Time: 02/08/16  9:23 PM  Result Value Ref Range   Glucose-Capillary 112 (H) 65 - 99 mg/dL   Comment 1 Notify RN   Glucose, capillary     Status: None   Collection Time: 02/09/16  7:30 AM  Result Value Ref Range   Glucose-Capillary 88 65 - 99 mg/dL   Comment 1 Notify RN   Glucose, capillary     Status: None   Collection Time: 02/09/16 12:00 PM  Result Value Ref Range   Glucose-Capillary 82 65 - 99 mg/dL   Comment 1 Notify RN   Glucose, capillary     Status: None   Collection Time: 02/09/16  5:12 PM  Result Value Ref Range   Glucose-Capillary 86 65 - 99 mg/dL   Comment 1 Notify  RN   Glucose, capillary     Status: Abnormal   Collection Time: 02/09/16  9:29 PM  Result Value Ref Range   Glucose-Capillary 112 (H) 65 - 99 mg/dL  Glucose, capillary     Status: None   Collection Time: 02/10/16  7:40 AM  Result Value Ref Range   Glucose-Capillary 82 65 - 99 mg/dL  Renal function panel     Status: Abnormal   Collection Time: 02/10/16 10:25 AM  Result Value Ref Range   Sodium 139 135 - 145 mmol/L   Potassium 3.6 3.5 - 5.1 mmol/L   Chloride 98 (L)  101 - 111 mmol/L   CO2 30 22 - 32 mmol/L   Glucose, Bld 93 65 - 99 mg/dL   BUN 20 6 - 20 mg/dL   Creatinine, Ser 7.63 (H) 0.61 - 1.24 mg/dL   Calcium 8.9 8.9 - 10.3 mg/dL   Phosphorus 3.9 2.5 - 4.6 mg/dL   Albumin 2.6 (L) 3.5 - 5.0 g/dL   GFR calc non Af Amer 7 (L) >60 mL/min   GFR calc Af Amer 8 (L) >60 mL/min    Comment: (NOTE) The eGFR has been calculated using the CKD EPI equation. This calculation has not been validated in all clinical situations. eGFR's persistently <60 mL/min signify possible Chronic Kidney Disease.    Anion gap 11 5 - 15  CBC     Status: Abnormal   Collection Time: 02/10/16 10:25 AM  Result Value Ref Range   WBC 7.3 3.8 - 10.6 K/uL   RBC 3.30 (L) 4.40 - 5.90 MIL/uL   Hemoglobin 9.7 (L) 13.0 - 18.0 g/dL   HCT 29.6 (L) 40.0 - 52.0 %   MCV 89.6 80.0 - 100.0 fL   MCH 29.3 26.0 - 34.0 pg   MCHC 32.7 32.0 - 36.0 g/dL   RDW 18.1 (H) 11.5 - 14.5 %   Platelets 146 (L) 150 - 440 K/uL  Differential     Status: Abnormal   Collection Time: 02/10/16 10:25 AM  Result Value Ref Range   Neutrophils Relative % 75 %   Neutro Abs 5.4 1.4 - 6.5 K/uL   Lymphocytes Relative 11 %   Lymphs Abs 0.8 (L) 1.0 - 3.6 K/uL   Monocytes Relative 12 %   Monocytes Absolute 0.9 0.2 - 1.0 K/uL   Eosinophils Relative 1 %   Eosinophils Absolute 0.1 0 - 0.7 K/uL   Basophils Relative 1 %   Basophils Absolute 0.0 0 - 0.1 K/uL  Basic metabolic panel     Status: Abnormal   Collection Time: 02/10/16  6:27 PM  Result Value Ref Range   Sodium 143 135 - 145 mmol/L   Potassium 3.5 3.5 - 5.1 mmol/L   Chloride 99 (L) 101 - 111 mmol/L   CO2 30 22 - 32 mmol/L   Glucose, Bld 84 65 - 99 mg/dL   BUN 11 6 - 20 mg/dL   Creatinine, Ser 5.03 (H) 0.61 - 1.24 mg/dL   Calcium 9.4 8.9 - 10.3 mg/dL   GFR calc non Af Amer 11 (L) >60 mL/min   GFR calc Af Amer 13 (L) >60 mL/min    Comment: (NOTE) The eGFR has been calculated using the CKD EPI equation. This calculation has not been validated in all  clinical situations. eGFR's persistently <60 mL/min signify possible Chronic Kidney Disease.    Anion gap 14 5 - 15    No results found.  Review of Systems  Constitutional: Positive for malaise/fatigue.  HENT: Positive for congestion.   Eyes: Negative.   Respiratory: Positive for shortness of breath.   Cardiovascular: Positive for palpitations, orthopnea and leg swelling.  Gastrointestinal: Negative.   Genitourinary: Negative.   Musculoskeletal: Positive for myalgias and back pain.  Skin: Negative.   Neurological: Positive for weakness.  Endo/Heme/Allergies: Negative.    Blood pressure 105/68, pulse 92, temperature 99.3 F (37.4 C), temperature source Oral, resp. rate 18, height 6' (1.829 m), weight 100.8 kg (222 lb 3.6 oz), SpO2 97 %. Physical Exam  Vitals reviewed. Constitutional: He is oriented to person, place, and time. He appears well-developed and well-nourished.  HENT:  Head: Normocephalic and atraumatic.  Eyes: Conjunctivae and EOM are normal. Pupils are equal, round, and reactive to light.  Neck: Normal range of motion. Neck supple.  Cardiovascular: S1 normal, S2 normal and normal pulses.  An irregularly irregular rhythm present. Tachycardia present.  Exam reveals gallop and S3.   Murmur heard.  Systolic murmur is present with a grade of 2/6  Respiratory: Effort normal and breath sounds normal.  GI: Soft. Bowel sounds are normal.  Musculoskeletal: Normal range of motion. He exhibits edema.  Chronic swelling of the left leg edema nonhealing fracture  Neurological: He is alert and oriented to person, place, and time. He has normal reflexes.  Skin: Skin is warm and dry.  Psychiatric: He has a normal mood and affect.    Assessment/Plan: Tachycardia Atrial fibrillation Diabetes type 2 GERD Hyperlipidemia Wide-complex tachycardia possibly A. fib with aberrancy Chronic nonhealing fracture.foot Hypertension Episode of hypotension Inspection of the  graft Preoperative surgery left arm . PLAN Continue amiodarone therapy Rate control for atrial fibrillation probably add Cardizem or digoxin because of hypotension Continue broad-spectrum antibiotic therapy Consider increasing Coreg Agree with insulin therapy for diabetes Protonix therapy for reflux symptoms DVT anticoagulation with heparin Pain control with hydrocodone Because of hypotension will probably give small dose of digoxin to help with rate    [name] [name] D. 02/10/2016, 6:55 PM

## 2016-02-10 NOTE — Progress Notes (Signed)
Paged Dr. Juliann Pares concerning pt's troponin level of 0.04.  MD acknowledged.  Per MD give .25 digoxin, 1 dose now and then 4 hours later another dose of  .25 digoxin.

## 2016-02-10 NOTE — Progress Notes (Signed)
[facility]  ROUNDING NOTE   Subjective:   Seen and examined on hemodialysis. Tolerating treatment well. UF goal of 1.5 litres  Objective:  Vital signs in last 24 hours:  Temp:  [97.7 F (36.5 C)-98.5 F (36.9 C)] 97.7 F (36.5 C) (02/17 1025) Pulse Rate:  [84-89] 89 (02/17 0449) Resp:  [18] 18 (02/17 0449) BP: (99-108)/(63-72) 105/68 mmHg (02/17 0449) SpO2:  [97 %-99 %] 97 % (02/17 0449) Weight:  [97.1 kg (214 lb 1.1 oz)-102.604 kg (226 lb 3.2 oz)] 97.1 kg (214 lb 1.1 oz) (02/17 1030)  Weight change: 0.908 kg (2 lb) Filed Weights   02/09/16 0659 02/10/16 0449 02/10/16 1030  Weight: 105.824 kg (233 lb 4.8 oz) 102.604 kg (226 lb 3.2 oz) 97.1 kg (214 lb 1.1 oz)    Intake/Output: I/O last 3 completed shifts: In: 580 [P.O.:480; IV Piggyback:100] Out: 0    Intake/Output this shift:     Physical Exam: General: NAD, laying in bed  Head: Normocephalic, atraumatic. Moist oral mucosal membranes  Eyes: Anicteric, PERRL  Neck: Supple, trachea midline  Lungs:  Clear to auscultation  Heart: Regular rate and rhythm  Abdomen:  Soft, nontender,   Extremities: No peripheral edema.  Neurologic: Nonfocal, moving all four extremities  Skin: No lesions  Access: Left forearm AVF    Basic Metabolic Panel:  Recent Labs Lab 02/07/16 1907 02/08/16 0404 02/08/16 1042 02/10/16 1025  NA  --  139 139 139  K  --  3.7 3.9 3.6  CL  --  100* 98* 98*  CO2  --  GLUCOSE  --  98 122* 93  BUN  --  CREATININE 6.59* 6.96* 7.29* 7.63*  CALCIUM  --  8.9 9.0 8.9  PHOS  --   --  3.8 3.9    Liver Function Tests:  Recent Labs Lab 02/08/16 1042 02/10/16 1025  ALBUMIN 2.8* 2.6*   No results for input(s): LIPASE, AMYLASE in the last 168 hours. No results for input(s): AMMONIA in the last 168 hours.  CBC:  Recent Labs Lab 02/07/16 1907 02/08/16 0404 02/08/16 1042 02/10/16 1025  WBC 5.9 5.7 6.5 7.3  NEUTROABS  --   --  5.0 5.4  HGB 10.6* 9.5* 9.5*  9.7*  HCT 32.8* 29.4* 29.7* 29.6*  MCV 91.9 92.0 89.9 89.6  PLT 178 161 166 146*    Cardiac Enzymes: No results for input(s): CKTOTAL, CKMB, CKMBINDEX, TROPONINI in the last 168 hours.  BNP: Invalid input(s): POCBNP  CBG:  Recent Labs Lab 02/09/16 0730 02/09/16 1200 02/09/16 1712 02/09/16 2129 02/10/16 0740  GLUCAP 88 82 86 112* 82    Microbiology: Results for orders placed or performed during the hospital encounter of 01/04/16  MRSA PCR Screening     Status: None   Collection Time: 01/09/16  2:38 PM  Result Value Ref Range Status   MRSA by PCR NEGATIVE NEGATIVE Final    Comment:        The GeneXpert MRSA Assay (FDA approved for NASAL specimens only), is one component of a comprehensive MRSA colonization surveillance program. It is not intended to diagnose MRSA infection nor to guide or monitor treatment for MRSA infections.     Coagulation Studies: No results for input(s): LABPROT, INR in the last 72 hours.  Urinalysis: No results for input(s): COLORURINE, LABSPEC, PHURINE, GLUCOSEU, HGBUR, BILIRUBINUR, KETONESUR, PROTEINUR, UROBILINOGEN, NITRITE, LEUKOCYTESUR in the last 72 hours.  Invalid input(s): APPERANCEUR    Imaging: No  results found.   Medications:     . amiodarone  400 mg Oral Daily  . aspirin EC  81 mg Oral Daily  . calcium acetate (Phos Binder)  1,600 mg Oral BID BM  . calcium acetate (Phos Binder)  3,200 mg Oral TID WC  . carvedilol  3.125 mg Oral BID WC  . docusate sodium  100 mg Oral BID  . heparin  5,000 Units Subcutaneous 3 times per day  . insulin aspart  1 Units Subcutaneous TID WC  . multivitamin  1 tablet Oral QHS  . pantoprazole  40 mg Oral Daily  . piperacillin-tazobactam (ZOSYN)  IV  3.375 g Intravenous Q12H  . simvastatin  20 mg Oral QHS   acetaminophen **OR** acetaminophen, HYDROcodone-acetaminophen, magnesium hydroxide, morphine injection, ondansetron **OR** ondansetron (ZOFRAN) IV, sodium phosphate, sorbitol,  traMADol  Assessment/ Plan:  [name] is a 60 y.o. black  male with ESRD on hemodialysis, hypertension, hyperlipidemia, diabetes, atrial fibrillation  MWF [facility] [address]   1. End-stage renal disease: tolerating dialysis treatment. Seen and examined on treatment  Continue MWF schedule.  Complication of dialysis access: steal syndrome: scheduled for ligation later today by Dr. Gilda Crease after his scheduled dialysis treatment.   2. Anemia of chronic kidney disease: hemoglobin of 9.7 - epo with HD treatment  3. Secondary hyperparathyroidism: phosphorus at goal. 3.9 - Continue Renvela for phosphorous binding.  4. Hypertension with atrial fibrillation: blood pressure at goal - carvedilol.     LOS: 3 [name] [name] 2/17/201712:17 PM

## 2016-02-10 NOTE — Transfer of Care (Signed)
Immediate Anesthesia Transfer of Care Note  Patient: [name] [name]  Procedure(s) Performed: Procedure(s) with comments: LIGATION OF ARTERIOVENOUS  FISTULA (N/A) - Procedure aborted per orders from anesthesiologist after entering Operating room  Patient Location: ICU  Anesthesia Type:MAC  Level of Consciousness: awake, alert  and oriented  Airway & Oxygen Therapy: Patient Spontanous Breathing and Patient connected to nasal cannula oxygen  Post-op Assessment: Report given to RN and Post -op Vital signs reviewed and stable  Post vital signs: Reviewed and stable  Last Vitals:  Filed Vitals:   02/10/16 1025 02/10/16 1400  BP:    Pulse:  92  Temp: 36.5 C   Resp:      Complications: No apparent anesthesia complications

## 2016-02-10 NOTE — Progress Notes (Signed)
ANTIBIOTIC CONSULT NOTE - FOLLOW UP  Pharmacy Consult for Zosyn  Indication: cellulitis  No Known Allergies  Patient Measurements: Height: 6' (182.9 cm) Weight: 226 lb 3.2 oz (102.604 kg) IBW/kg (Calculated) : 77.6 Adjusted Body Weight:   Vital Signs: Temp: 98.3 F (36.8 C) (02/17 0449) Temp Source: Oral (02/17 0449) BP: 105/68 mmHg (02/17 0449) Pulse Rate: 89 (02/17 0449) Intake/Output from previous day: 02/16 0701 - 02/17 0700 In: 580 [P.O.:480; IV Piggyback:100] Out: -  Intake/Output from this shift:    Labs:  Recent Labs  02/07/16 1907 02/08/16 0404 02/08/16 1042  WBC 5.9 5.7 6.5  HGB 10.6* 9.5* 9.5*  PLT 178 161 166  CREATININE 6.59* 6.96* 7.29*   Estimated Creatinine Clearance: 13.7 mL/min (by C-G formula based on Cr of 7.29). No results for input(s): VANCOTROUGH, VANCOPEAK, VANCORANDOM, GENTTROUGH, GENTPEAK, GENTRANDOM, TOBRATROUGH, TOBRAPEAK, TOBRARND, AMIKACINPEAK, AMIKACINTROU, AMIKACIN in the last 72 hours.   Microbiology: No results found for this or any previous visit (from the past 720 hour(s)).  Medical History: Past Medical History  Diagnosis Date  . Renal disorder   . Diabetes mellitus without complication ([facility])   . Hypertension   . Hyperlipidemia   . ESRD (end stage renal disease) ([facility])   . Secondary hyperparathyroidism ([facility])   . GERD (gastroesophageal reflux disease)     Medications:  Scheduled:  . amiodarone  400 mg Oral Daily  . aspirin EC  81 mg Oral Daily  . calcium acetate (Phos Binder)  1,600 mg Oral BID BM  . calcium acetate (Phos Binder)  3,200 mg Oral TID WC  . carvedilol  3.125 mg Oral BID WC  . docusate sodium  100 mg Oral BID  . epoetin (EPOGEN/PROCRIT) injection  10,000 Units Intravenous Once  . heparin  5,000 Units Subcutaneous 3 times per day  . insulin aspart  1 Units Subcutaneous TID WC  . multivitamin  1 tablet Oral QHS  . pantoprazole  40 mg Oral Daily  . piperacillin-tazobactam (ZOSYN)  IV  3.375 g Intravenous  Q12H  . simvastatin  20 mg Oral QHS   Assessment: Pharmacy consulted to dose zosyn in this 60 year old male with cellulitis, pt on dialysis.  Goal of Therapy:  resolution of infection  Plan:  Expected duration 7 days with resolution of temperature and/or normalization of WBC   Will continue Zosyn 3.375 g IV q12 hours. Vascular wants to continue for now per MD Patel's note.   Jahaad Penado D 02/10/2016,9:43 AM

## 2016-02-10 NOTE — Progress Notes (Signed)
Notifed RN Baxter Hire with [facility] to notify Dr. Kendrick Fries that patient has a head bleed in right hemisphere measuring 3.2 x 1.6cm per radiologist that just notified me.  Heparin still on hold.

## 2016-02-10 NOTE — Anesthesia Preprocedure Evaluation (Addendum)
Anesthesia Evaluation  Patient identified by MRN, date of birth, ID band Patient awake    Reviewed: Allergy & Precautions, NPO status , Patient's Chart, lab work & pertinent test results, reviewed documented beta blocker date and time   Airway Mallampati: III   Neck ROM: Full    Dental  (+) Teeth Intact, Poor Dentition   Pulmonary    Pulmonary exam normal        Cardiovascular hypertension, Pt. on medications and Pt. on home beta blockers  Rhythm:Irregular Rate:Normal     Neuro/Psych negative neurological ROS  negative psych ROS   GI/Hepatic Neg liver ROS, GERD  Medicated and Controlled,  Endo/Other  diabetes, Well Controlled, Type 2, Oral Hypoglycemic Agents  Renal/GU CRF and ESRFRenal disease  negative genitourinary   Musculoskeletal negative musculoskeletal ROS (+)   Abdominal   Peds negative pediatric ROS (+)  Hematology negative hematology ROS (+)   Anesthesia Other Findings   Reproductive/Obstetrics                          Anesthesia Physical Anesthesia Plan  ASA: III  Anesthesia Plan: General   Post-op Pain Management:    Induction: Intravenous  Airway Management Planned:   Additional Equipment:   Intra-op Plan:   Post-operative Plan: Extubation in OR  Informed Consent: I have reviewed the patients History and Physical, chart, labs and discussed the procedure including the risks, benefits and alternatives for the proposed anesthesia with the patient or authorized representative who has indicated his/her understanding and acceptance.   Dental advisory given  Plan Discussed with: CRNA and Surgeon  Anesthesia Plan Comments:         Anesthesia Quick Evaluation

## 2016-02-10 NOTE — Progress Notes (Signed)
Paged [facility] physician Dr. Kendrick Fries concerning pt's diet being reinstated.  Per MD, go ahead and reorder renal diet.

## 2016-02-10 NOTE — Progress Notes (Signed)
[facility] [place] at [facility]                                                                                                                                                                                            Patient Demographics   [name] [name], is a 60 y.o. male, DOB - Oct 08, 1956, UJW:[id number]  Admit date - 02/07/2016   Admitting Physician [name], MD  Outpatient Primary MD for the patient is [name], [name], MD   LOS - 3  Subjective: Patient seen in hemodialysis, complains of chronic ongoing issues with his abdomen. Denies any other complaints including chest pain or shortness of breath   Review of Systems:   CONSTITUTIONAL: No documented fever. No fatigue, weakness. No weight gain, no weight loss.  EYES: No blurry or double vision.  ENT: No tinnitus. No postnasal drip. No redness of the oropharynx.  RESPIRATORY: No cough, no wheeze, no hemoptysis. No dyspnea.  CARDIOVASCULAR: No chest pain. No orthopnea. No palpitations. No syncope.  GASTROINTESTINAL: No nausea, no vomiting or diarrhea. Intermittent abdominal pain. No melena or hematochezia.  GENITOURINARY: No dysuria or hematuria.  ENDOCRINE: No polyuria or nocturia. No heat or cold intolerance.  HEMATOLOGY: No anemia. No bruising. No bleeding.  INTEGUMENTARY: No rashes. No lesions.  MUSCULOSKELETAL: No arthritis. Left leg swelling Neurologic no numbness, tingling, or ataxia. No seizure-type activity.  PSYCHIATRIC: No anxiety. No insomnia. No ADD.    Vitals:   Filed Vitals:   02/09/16 2040 02/10/16 0449 02/10/16 1025 02/10/16 1030  BP: 108/72 105/68    Pulse: 87 89    Temp: 98.5 F (36.9 C) 98.3 F (36.8 C) 97.7 F (36.5 C)   TempSrc: Oral Oral Oral   Resp: 18 18    Height:      Weight:  102.604 kg (226 lb 3.2 oz)  97.1 kg (214 lb 1.1 oz)  SpO2: 99% 97%      Wt Readings from Last 3 Encounters:  02/10/16 97.1 kg (214 lb 1.1 oz)  01/10/16 104.01 kg (229 lb 4.8 oz)   11/29/15 106.595 kg (235 lb)     Intake/Output Summary (Last 24 hours) at 02/10/16 1157 Last data filed at 02/10/16 0300  Gross per 24 hour  Intake    340 ml  Output      0 ml  Net    340 ml    Physical Exam:   GENERAL: Pleasant-appearing in no apparent distress.  HEAD, EYES, EARS, NOSE AND THROAT: Atraumatic, normocephalic. Extraocular muscles are intact. Pupils equal and reactive to light. Sclerae anicteric. No conjunctival injection. No oro-pharyngeal erythema.  NECK:  Supple. There is no jugular venous distention. No bruits, no lymphadenopathy, no thyromegaly.  HEART: Regular rate and rhythm,. No murmurs, no rubs, no clicks.  LUNGS: Clear to auscultation bilaterally. No rales or rhonchi. No wheezes.  ABDOMEN: Soft, flat, nontender, nondistended. Has good bowel sounds. No hepatosplenomegaly appreciated.  EXTREMITIES: No evidence of any cyanosis, clubbing, or peripheral edema.  +2 pedal and radial pulses bilaterally. Left leg less edema  NEUROLOGIC: The patient is alert, awake, and oriented x3 with no focal motor or sensory deficits appreciated bilaterally.  SKIN: Moist and warm with no rashes appreciated.  Psych: Not anxious, depressed LN: No inguinal LN enlargement    Antibiotics   Anti-infectives    Start     Dose/Rate Route Frequency Ordered Stop   02/07/16 2200  piperacillin-tazobactam (ZOSYN) IVPB 3.375 g     3.375 g 12.5 mL/hr over 240 Minutes Intravenous Every 12 hours 02/07/16 1937        Medications   Scheduled Meds: . amiodarone  400 mg Oral Daily  . aspirin EC  81 mg Oral Daily  . calcium acetate (Phos Binder)  1,600 mg Oral BID BM  . calcium acetate (Phos Binder)  3,200 mg Oral TID WC  . carvedilol  3.125 mg Oral BID WC  . docusate sodium  100 mg Oral BID  . heparin  5,000 Units Subcutaneous 3 times per day  . insulin aspart  1 Units Subcutaneous TID WC  . multivitamin  1 tablet Oral QHS  . pantoprazole  40 mg Oral Daily  . piperacillin-tazobactam  (ZOSYN)  IV  3.375 g Intravenous Q12H  . simvastatin  20 mg Oral QHS   Continuous Infusions:  PRN Meds:.acetaminophen **OR** acetaminophen, HYDROcodone-acetaminophen, magnesium hydroxide, morphine injection, ondansetron **OR** ondansetron (ZOFRAN) IV, sodium phosphate, sorbitol, traMADol   Data Review:   Micro Results No results found for this or any previous visit (from the past 240 hour(s)).  Radiology Reports US Venous Img Lower Unilateral Left  02/07/2016  CLINICAL DATA:  Left lower extremity swelling. EXAM: LEFT LOWER EXTREMITY VENOUS DOPPLER ULTRASOUND TECHNIQUE: Gray-scale sonography with graded compression, as well as color Doppler and duplex ultrasound, were performed to evaluate the deep venous system from the level of the common femoral vein through the popliteal and proximal calf veins. Spectral Doppler was utilized to evaluate flow at rest and with distal augmentation maneuvers. COMPARISON:  09/08/2015 FINDINGS: Right common femoral vein is patent without thrombus. Normal compressibility, augmentation and color Doppler flow in the left common femoral vein, left femoral vein and left popliteal vein. The left saphenofemoral junction is patent. Left profunda femoral vein is patent without thrombus. Limited evaluation of the left calf veins due to soft tissue swelling. There is subcutaneous edema in the left leg. Prominent inguinal lymph nodes bilaterally. IMPRESSION: Negative for deep venous thrombosis in left lower extremity. Prominent bilateral inguinal lymph nodes. Electronically Signed   By: Richarda Overlie M.D.   On: 02/07/2016 13:40     CBC  Recent Labs Lab 02/07/16 1907 02/08/16 0404 02/08/16 1042 02/10/16 1025  WBC 5.9 5.7 6.5 7.3  HGB 10.6* 9.5* 9.5* 9.7*  HCT 32.8* 29.4* 29.7* 29.6*  PLT 178 161 166 146*  MCV 91.9 92.0 89.9 89.6  MCH 29.7 29.8 28.9 29.3  MCHC 32.4 32.4 32.2 32.7  RDW 18.2* 17.6* 17.7* 18.1*  LYMPHSABS  --   --  0.8* 0.8*  MONOABS  --   --  0.5 0.9   EOSABS  --   --  0.2 0.1  BASOSABS  --   --  0.1 0.0    Chemistries   Recent Labs Lab 02/07/16 1907 02/08/16 0404 02/08/16 1042 02/10/16 1025  NA  --  139 139 139  K  --  3.7 3.9 3.6  CL  --  100* 98* 98*  CO2  --  GLUCOSE  --  98 122* 93  BUN  --  CREATININE 6.59* 6.96* 7.29* 7.63*  CALCIUM  --  8.9 9.0 8.9   ------------------------------------------------------------------------------------------------------------------ estimated creatinine clearance is 12.7 mL/min (by C-G formula based on Cr of 7.63). ------------------------------------------------------------------------------------------------------------------ No results for input(s): HGBA1C in the last 72 hours. ------------------------------------------------------------------------------------------------------------------ No results for input(s): CHOL, HDL, LDLCALC, TRIG, CHOLHDL, LDLDIRECT in the last 72 hours. ------------------------------------------------------------------------------------------------------------------ No results for input(s): TSH, T4TOTAL, T3FREE, THYROIDAB in the last 72 hours.  Invalid input(s): FREET3 ------------------------------------------------------------------------------------------------------------------ No results for input(s): VITAMINB12, FOLATE, FERRITIN, TIBC, IRON, RETICCTPCT in the last 72 hours.  Coagulation profile No results for input(s): INR, PROTIME in the last 168 hours.  No results for input(s): DDIMER in the last 72 hours.  Cardiac Enzymes No results for input(s): CKMB, TROPONINI, MYOGLOBIN in the last 168 hours.  Invalid input(s): CK ------------------------------------------------------------------------------------------------------------------ Invalid input(s): POCBNP    Assessment & Plan   60 year old year-old male with end-stage renal disease and recent upper GI bleed admitted for angiogram for stealsyndrome.  1. End-stage  renal disease on hemodialysis: Continue dialysis as per renal team.  2. Cardiomyopathy EF of 20% with atrial fibrillation: Continue amiodarone and Coreg. No cardiac symptoms   3. Hyperlipidemia: Continue simvastatin  4. Diabetes: Continue sliding scale insulin and renal/diabetic diet.blood glucose stable  5. Charcot's foot with cellulitis of the left leg continue Zosyn as per vascular  6.steal syndrome of the left hand treatment per vascular surgery later OR today       Code Status Orders        Start     Ordered   02/07/16 1532  Full code   Continuous     02/07/16 1535    Code Status History    Date Active Date Inactive Code Status Order ID Comments User Context   01/04/2016  7:51 PM 01/10/2016  6:01 PM Full Code [id number]  Houston Siren, MD Inpatient              DVT Prophylaxis   Heparin   Lab Results  Component Value Date   PLT 146* 02/10/2016     Time Spent in minutes    Auburn Bilberry M.D on 02/10/2016 at 11:57 AM  Between 7am to 6pm - Pager - [phone]  After 6pm go to [email or web address] - password [email or web address]  [facility] [place] [facility]   Office  [phone]

## 2016-02-10 NOTE — Progress Notes (Signed)
eLink Physician-Brief Progress Note Patient Name: [name] [name] DOB: 07-14-1956 MRN: [id number]   Date of Service  02/10/2016  HPI/Events of Note  Wide complex tach while receiving HD access Stable on camera check  eICU Interventions  No eICU intervention        Max Fickle 02/10/2016, 6:01 PM

## 2016-02-10 NOTE — H&P (Signed)
[facility] History & Physical Update  The patient was interviewed and re-examined.  The patient's previous History and Physical has been reviewed and is unchanged.  There is no change in the plan of care. We plan to proceed with the scheduled procedure.  Brinson Tozzi, Latina Craver, MD  02/10/2016, 1:24 PM

## 2016-02-10 NOTE — Care Management (Signed)
Patient has complication of his dialysis access device- steal syndrome.  For ligation today.  REceives HD at [facility] [address].  Denies issues with transportation or accessing medical care

## 2016-02-11 LAB — TROPONIN I
Troponin I: <0.03 ng/mL
Troponin I: 0.06 ng/mL — ABNORMAL HIGH (ref <0.031)

## 2016-02-11 LAB — CBC
HCT: 30.9 % — ABNORMAL LOW (ref 40.0–52.0)
HEMOGLOBIN: 10 g/dL — AB (ref 13.0–18.0)
MCH: 29.7 pg (ref 26.0–34.0)
MCHC: 32.3 g/dL (ref 32.0–36.0)
MCV: 91.9 fL (ref 80.0–100.0)
PLATELETS: 145 10*3/uL — AB (ref 150–440)
RBC: 3.36 MIL/uL — AB (ref 4.40–5.90)
RDW: 17.8 % — ABNORMAL HIGH (ref 11.5–14.5)
WBC: 6.2 10*3/uL (ref 3.8–10.6)

## 2016-02-11 LAB — MRSA PCR SCREENING: MRSA by PCR: NEGATIVE

## 2016-02-11 LAB — GLUCOSE, CAPILLARY
GLUCOSE-CAPILLARY: 69 mg/dL (ref 65–99)
Glucose-Capillary: 70 mg/dL (ref 65–99)
Glucose-Capillary: 99 mg/dL (ref 65–99)

## 2016-02-11 MED ORDER — DIGOXIN 125 MCG PO TABS: 0.1250 mg | ORAL_TABLET | Freq: Every day | ORAL | Status: AC

## 2016-02-11 MED ORDER — CEPHALEXIN 500 MG PO CAPS: 500.0000 mg | ORAL_CAPSULE | Freq: Two times a day (BID) | ORAL | Status: DC

## 2016-02-11 NOTE — Progress Notes (Signed)
Patient's blood sugar is 70. Encouraged him to eat breakfast and will check afterwards. He is awake, alert and talking and denies any weakness.

## 2016-02-11 NOTE — Anesthesia Postprocedure Evaluation (Signed)
Anesthesia Post Note  Patient: [name] [name]  Procedure(s) Performed: Procedure(s) (LRB): LIGATION OF ARTERIOVENOUS  FISTULA (N/A)  Patient location during evaluation: SICU Anesthesia Type: General and MAC Level of consciousness: sedated Pain management: pain level controlled Vital Signs Assessment: post-procedure vital signs reviewed and stable Respiratory status: patient remains intubated per anesthesia plan Cardiovascular status: stable and tachycardic Anesthetic complications: no Comments: intraop wide complex tach treated and case aborted prior to initiation.  JA    Last Vitals:  Filed Vitals:   02/11/16 0700 02/11/16 0800  BP: 120/69 122/80  Pulse: 88 88  Temp:  36.8 C  Resp: 23 32    Last Pain:  Filed Vitals:   02/11/16 0836  PainSc: 0-No pain                 Yevette Edwards

## 2016-02-11 NOTE — Progress Notes (Signed)
Patient discharged to home. He left unit in a wheelchair escorted by family.

## 2016-02-11 NOTE — Progress Notes (Signed)
Subjective:  Patient states to feel much better reduced palpitations tachycardia no shortness of breath no chest pain would like to go home and return as an outpatient for arm surgery.  Objective:  Vital Signs in the last 24 hours: Temp:  [98.2 F (36.8 C)-99.3 F (37.4 C)] 98.2 F (36.8 C) (02/18 0800) Pulse Rate:  [88-100] 88 (02/18 0800) Resp:  [14-32] 32 (02/18 0800) BP: (103-129)/(69-89) 122/80 mmHg (02/18 0800) SpO2:  [90 %-100 %] 91 % (02/18 0800) Weight:  [99.791 kg (220 lb)-100.8 kg (222 lb 3.6 oz)] 99.791 kg (220 lb) (02/18 0421)  Intake/Output from previous day: 02/17 0701 - 02/18 0700 In: 50 [IV Piggyback:50] Out: 1500  Intake/Output from this shift:    Physical Exam: General appearance: alert, cooperative and appears stated age Neck: no adenopathy, no carotid bruit, no JVD, supple, symmetrical, trachea midline and thyroid not enlarged, symmetric, no tenderness/mass/nodules Lungs: clear to auscultation bilaterally Heart: S3 present and regular rate and rhythm systolic murmur 2/6 Abdomen: soft, non-tender; bowel sounds normal; no masses,  no organomegaly Extremities: procedures pulse and left arm swelling of left foot Pulses: 2+ and symmetric Skin: Skin color, texture, turgor normal. No rashes or lesions Neurologic: Alert and oriented X 3, normal strength and tone. Normal symmetric reflexes. Normal coordination and gait  Lab Results:  Recent Labs  02/10/16 1025 02/11/16 0111  WBC 7.3 6.2  HGB 9.7* 10.0*  PLT 146* 145*    Recent Labs  02/10/16 1025 02/10/16 1827  NA 139 143  K 3.6 3.5  CL 98* 99*  CO2 30 30  GLUCOSE 93 84  BUN 20 11  CREATININE 7.63* 5.03*    Recent Labs  02/11/16 0111 02/11/16 0413  TROPONINI <0.03 0.06*   Hepatic Function Panel  Recent Labs  02/10/16 1025  ALBUMIN 2.6*   No results for input(s): CHOL in the last 72 hours. No results for input(s): PROTIME in the last 72 hours.  Imaging: Imaging results have been  reviewed  Cardiac Studies:  Assessment/Plan:  Arrhythmia Atrial Fibrillation Cardiomyopathy Edema Hypotension/Shock Palpitations Shortness of Breath  Peripheral vascular disease End-stage renal disease History of hypertension GERD Diabetes type 2 uncomplicated . PLAN Agree with ICU for blood pressure control hypertension improvement Mild fluid resuscitation Control tachycardia by adding digoxin in addition to amiodarone Continue Coreg Agree with simvastatin for lipid management Continue usual dialysis Diabetes management with insulin for reasonable control Patient seems stable enough to be discharged home on digoxin follow-up as an outpatient with cardiology  LOS: 4 days    CALLWOOD,DWAYNE D. 02/11/2016, 11:46 AM

## 2016-02-11 NOTE — Progress Notes (Signed)
Patient's blood sugar in 69. Hypoglycemia protocol given and I had him drink gingerale. Will check blood sugar in 15 minutes.

## 2016-02-11 NOTE — Progress Notes (Signed)
    Subjective  -  Wants to go home Left hand does not hurt   Physical Exam:  Left hand warm No SOB B LE edema       Assessment/Plan:    Cleared to go home by cardiology Will not plan for radial artery ligation as he is now asx D/c home  Brabham, [place] 02/11/2016 12:44 PM --  Filed Vitals:   02/11/16 1100 02/11/16 1200  BP: 122/74 120/68  Pulse: 87 88  Temp:  98.6 F (37 C)  Resp: 21 27    Intake/Output Summary (Last 24 hours) at 02/11/16 1244 Last data filed at 02/11/16 1010  Gross per 24 hour  Intake    100 ml  Output   1500 ml  Net  -1400 ml     Laboratory CBC    Component Value Date/Time   WBC 6.2 02/11/2016 0111   WBC 12.0* 07/05/2012 0905   HGB 10.0* 02/11/2016 0111   HGB 9.3* 07/05/2012 0905   HCT 30.9* 02/11/2016 0111   HCT 29.4* 07/05/2012 0905   PLT 145* 02/11/2016 0111   PLT See comment 07/05/2012 0905    BMET    Component Value Date/Time   NA 143 02/10/2016 1827   NA 138 04/23/2013 0656   K 3.5 02/10/2016 1827   K 3.7 10/29/2013 1059   CL 99* 02/10/2016 1827   CL 102 04/23/2013 0656   CO2 30 02/10/2016 1827   CO2 30 04/23/2013 0656   GLUCOSE 84 02/10/2016 1827   GLUCOSE 125* 04/23/2013 0656   BUN 11 02/10/2016 1827   BUN 17 04/23/2013 0656   CREATININE 5.03* 02/10/2016 1827   CREATININE 6.52* 04/23/2013 0656   CALCIUM 9.4 02/10/2016 1827   CALCIUM 10.3* 04/23/2013 0656   GFRNONAA 11* 02/10/2016 1827   GFRNONAA 9* 04/23/2013 0656   GFRAA 13* 02/10/2016 1827   GFRAA 10* 04/23/2013 0656    COAG Lab Results  Component Value Date   INR 1.58 01/06/2016   INR 5.35* 01/05/2016   INR >10.00* 01/04/2016   No results found for: PTT  Antibiotics Anti-infectives    Start     Dose/Rate Route Frequency Ordered Stop   02/07/16 2200  piperacillin-tazobactam (ZOSYN) IVPB 3.375 g     3.375 g 12.5 mL/hr over 240 Minutes Intravenous Every 12 hours 02/07/16 1937         V. Charlena Cross, M.D. [facility] [place] Office: [phone] Pager:  [phone]

## 2016-02-11 NOTE — Progress Notes (Signed)
Pt is resting comfortably with no complaints of pain.  Pt remains alert and oriented and is now sinus rhythm on the cardiac monitor.  Pt remains on room air with sats in the 90's.

## 2016-02-11 NOTE — Progress Notes (Signed)
[facility]  ROUNDING NOTE   Subjective:   Hemodialysis yesterday. Tolerated treatment well. UF of 1.5 litres. No issues using AVF.   Patient went for ligation of his fistula due to steal syndrome however developed wide complex tachycardia. Cardiology evaluated and started patient on digoxin.   Patient wants to go home. Transferred to ICU.   Objective:  Vital signs in last 24 hours:  Temp:  [97.7 F (36.5 C)-99.3 F (37.4 C)] 98.2 F (36.8 C) (02/18 0800) Pulse Rate:  [88-100] 88 (02/18 0800) Resp:  [14-32] 32 (02/18 0800) BP: (103-129)/(69-89) 122/80 mmHg (02/18 0800) SpO2:  [90 %-100 %] 91 % (02/18 0800) Weight:  [97.1 kg (214 lb 1.1 oz)-100.8 kg (222 lb 3.6 oz)] 99.791 kg (220 lb) (02/18 0421)  Weight change: -5.504 kg (-12 lb 2.1 oz) Filed Weights   02/10/16 1030 02/10/16 1741 02/11/16 0421  Weight: 97.1 kg (214 lb 1.1 oz) 100.8 kg (222 lb 3.6 oz) 99.791 kg (220 lb)    Intake/Output: I/O last 3 completed shifts: In: 100 [IV Piggyback:100] Out: 1500 [Other:1500]   Intake/Output this shift:     Physical Exam: General: NAD, laying in bed  Head: Normocephalic, atraumatic. Moist oral mucosal membranes  Eyes: Anicteric, PERRL  Neck: Supple, trachea midline  Lungs:  Clear to auscultation  Heart: Regular rate and rhythm  Abdomen:  Soft, nontender,   Extremities: No peripheral edema.  Neurologic: Nonfocal, moving all four extremities  Skin: No lesions  Access: Left forearm AVF    Basic Metabolic Panel:  Recent Labs Lab 02/07/16 1907  02/08/16 0404 02/08/16 1042 02/10/16 1025 02/10/16 1827 02/10/16 2228  NA  --   --  139 139 139 143  --   K  --   --  3.7 3.9 3.6 3.5  --   CL  --   --  100* 98* 98* 99*  --   CO2  --   --  --   GLUCOSE  --   --  98 122* 93 84  --   BUN  --   --  --   CREATININE 6.59*  --  6.96* 7.29* 7.63* 5.03*  --   CALCIUM  --   < > 8.9 9.0 8.9 9.4  --   MG  --   --   --   --   --   --  1.8  PHOS   --   --   --  3.8 3.9  --   --   < > = values in this interval not displayed.  Liver Function Tests:  Recent Labs Lab 02/08/16 1042 02/10/16 1025  ALBUMIN 2.8* 2.6*   No results for input(s): LIPASE, AMYLASE in the last 168 hours. No results for input(s): AMMONIA in the last 168 hours.  CBC:  Recent Labs Lab 02/07/16 1907 02/08/16 0404 02/08/16 1042 02/10/16 1025 02/11/16 0111  WBC 5.9 5.7 6.5 7.3 6.2  NEUTROABS  --   --  5.0 5.4  --   HGB 10.6* 9.5* 9.5* 9.7* 10.0*  HCT 32.8* 29.4* 29.7* 29.6* 30.9*  MCV 91.9 92.0 89.9 89.6 91.9  PLT 178 161 166 146* 145*    Cardiac Enzymes:  Recent Labs Lab 02/10/16 2228 02/11/16 0111 02/11/16 0413  TROPONINI 0.04* <0.03 0.06*    BNP: Invalid input(s): POCBNP  CBG:  Recent Labs Lab 02/09/16 2129 02/10/16 0740 02/10/16 2201 02/11/16 0719 02/11/16 0815  GLUCAP 112* 82 77  69 70    Microbiology: Results for orders placed or performed during the hospital encounter of 02/07/16  MRSA PCR Screening     Status: None   Collection Time: 02/10/16  5:30 PM  Result Value Ref Range Status   MRSA by PCR NEGATIVE NEGATIVE Final    Comment:        The GeneXpert MRSA Assay (FDA approved for NASAL specimens only), is one component of a comprehensive MRSA colonization surveillance program. It is not intended to diagnose MRSA infection nor to guide or monitor treatment for MRSA infections.     Coagulation Studies: No results for input(s): LABPROT, INR in the last 72 hours.  Urinalysis: No results for input(s): COLORURINE, LABSPEC, PHURINE, GLUCOSEU, HGBUR, BILIRUBINUR, KETONESUR, PROTEINUR, UROBILINOGEN, NITRITE, LEUKOCYTESUR in the last 72 hours.  Invalid input(s): APPERANCEUR    Imaging: No results found.   Medications:     . amiodarone  400 mg Oral Daily  . aspirin EC  81 mg Oral Daily  . calcium acetate (Phos Binder)  1,600 mg Oral BID BM  . calcium acetate (Phos Binder)  3,200 mg Oral TID WC  .  carvedilol  3.125 mg Oral BID WC  . digoxin  0.125 mg Intravenous Daily  . docusate sodium  100 mg Oral BID  . heparin  5,000 Units Subcutaneous 3 times per day  . insulin aspart  1 Units Subcutaneous TID WC  . multivitamin  1 tablet Oral QHS  . pantoprazole  40 mg Oral Daily  . piperacillin-tazobactam (ZOSYN)  IV  3.375 g Intravenous Q12H  . simvastatin  20 mg Oral QHS   acetaminophen **OR** acetaminophen, HYDROcodone-acetaminophen, magnesium hydroxide, morphine injection, ondansetron **OR** ondansetron (ZOFRAN) IV, sodium phosphate, sorbitol, traMADol  Assessment/ Plan:  [name] is a 60 y.o. black  male with ESRD on hemodialysis, hypertension, hyperlipidemia, diabetes, atrial fibrillation  MWF [facility] [address]   1. End-stage renal disease: tolerated dialysis treatment. Seen and examined on treatment  Continue MWF schedule.  Complication of dialysis access: steal syndrome: scheduled for ligation yesterday by Dr. Gilda Crease  However with tachycardia.   2. Anemia of chronic kidney disease: hemoglobin of 10 - epo with HD treatment  3. Secondary hyperparathyroidism: phosphorus at goal. 3.9 - Continue Renvela for phosphorous binding.  4. Hypertension with atrial fibrillation: and history of complex tachycardia. Appreciate cardiology input. Started on digoxin.  - May need to increase carvedilol and/or restart diltiazem.     LOS: 4 [name] 2/18/20178:51 AM

## 2016-02-11 NOTE — Progress Notes (Signed)
[facility] [place] at [facility]                                                                                                                                                                                            Patient Demographics   [name] [name], is a 60 y.o. male, DOB - 1956-11-05, ZOX:[id number]  Admit date - 02/07/2016   Admitting Physician [name], MD  Outpatient Primary MD for the patient is [name], [name], MD   LOS - 4  Subjective: No shortness of breath. Patient went for ligation of fistula due to steal syndrome but the developed wide-complex tachycardia so procedure was terminated , He feels better. Wants to go home. Review of Systems:   CONSTITUTIONAL: No documented fever. No fatigue, weakness. No weight gain, no weight loss.  EYES: No blurry or double vision.  ENT: No tinnitus. No postnasal drip. No redness of the oropharynx.  RESPIRATORY: No cough, no wheeze, no hemoptysis. No dyspnea.  CARDIOVASCULAR: No chest pain. No orthopnea. No palpitations. No syncope.  GASTROINTESTINAL: No nausea, no vomiting or diarrhea.  noAbdominal pain. No melena or hematochezia.  GENITOURINARY: No dysuria or hematuria.  ENDOCRINE: No polyuria or nocturia. No heat or cold intolerance.  HEMATOLOGY: No anemia. No bruising. No bleeding.  INTEGUMENTARY: No rashes. No lesions.  MUSCULOSKELETAL: No arthritis. Left leg swelling Neurologic no numbness, tingling, or ataxia. No seizure-type activity.  PSYCHIATRIC: No anxiety. No insomnia. No ADD.    Vitals:   Filed Vitals:   02/11/16 0500 02/11/16 0600 02/11/16 0700 02/11/16 0800  BP: 117/73 124/72 120/69 122/80  Pulse: 89 89 88 88  Temp:    98.2 F (36.8 C)  TempSrc:    Axillary  Resp: 32  Height:      Weight:      SpO2: 98% 91% 90% 91%    Wt Readings from Last 3 Encounters:  02/11/16 99.791 kg (220 lb)  01/10/16 104.01 kg (229 lb 4.8 oz)  11/29/15 106.595 kg (235 lb)      Intake/Output Summary (Last 24 hours) at 02/11/16 1209 Last data filed at 02/11/16 0500  Gross per 24 hour  Intake     50 ml  Output   1500 ml  Net  -1450 ml    Physical Exam:   GENERAL: Pleasant-appearing in no apparent distress.  HEAD, EYES, EARS, NOSE AND THROAT: Atraumatic, normocephalic. Extraocular muscles are intact. Pupils equal and reactive to light. Sclerae anicteric. No conjunctival injection. No oro-pharyngeal erythema.  NECK: Supple. There is no jugular venous distention. No bruits, no lymphadenopathy, no thyromegaly.  HEART: Regular  rate and rhythm,. No murmurs, no rubs, no clicks.  LUNGS: Clear to auscultation bilaterally. No rales or rhonchi. No wheezes.  ABDOMEN: Soft, flat, nontender, nondistended. Has good bowel sounds. No hepatosplenomegaly appreciated.  EXTREMITIES: No evidence of any cyanosis, clubbing, or peripheral edema.  +2 pedal and radial pulses bilaterally. Left leg less edema  NEUROLOGIC: The patient is alert, awake, and oriented x3 with no focal motor or sensory deficits appreciated bilaterally.  SKIN: Moist and warm with no rashes appreciated.  Psych: Not anxious, depressed LN: No inguinal LN enlargement    Antibiotics   Anti-infectives    Start     Dose/Rate Route Frequency Ordered Stop   02/07/16 2200  piperacillin-tazobactam (ZOSYN) IVPB 3.375 g     3.375 g 12.5 mL/hr over 240 Minutes Intravenous Every 12 hours 02/07/16 1937        Medications   Scheduled Meds: . amiodarone  400 mg Oral Daily  . aspirin EC  81 mg Oral Daily  . calcium acetate (Phos Binder)  1,600 mg Oral BID BM  . calcium acetate (Phos Binder)  3,200 mg Oral TID WC  . carvedilol  3.125 mg Oral BID WC  . digoxin  0.125 mg Intravenous Daily  . docusate sodium  100 mg Oral BID  . heparin  5,000 Units Subcutaneous 3 times per day  . insulin aspart  1 Units Subcutaneous TID WC  . multivitamin  1 tablet Oral QHS  . pantoprazole  40 mg Oral Daily  .  piperacillin-tazobactam (ZOSYN)  IV  3.375 g Intravenous Q12H  . simvastatin  20 mg Oral QHS   Continuous Infusions:  PRN Meds:.acetaminophen **OR** acetaminophen, HYDROcodone-acetaminophen, magnesium hydroxide, morphine injection, ondansetron **OR** ondansetron (ZOFRAN) IV, sodium phosphate, sorbitol, traMADol   Data Review:   Micro Results Recent Results (from the past 240 hour(s))  MRSA PCR Screening     Status: None   Collection Time: 02/10/16  5:30 PM  Result Value Ref Range Status   MRSA by PCR NEGATIVE NEGATIVE Final    Comment:        The GeneXpert MRSA Assay (FDA approved for NASAL specimens only), is one component of a comprehensive MRSA colonization surveillance program. It is not intended to diagnose MRSA infection nor to guide or monitor treatment for MRSA infections.     Radiology Reports US Venous Img Lower Unilateral Left  02/07/2016  CLINICAL DATA:  Left lower extremity swelling. EXAM: LEFT LOWER EXTREMITY VENOUS DOPPLER ULTRASOUND TECHNIQUE: Gray-scale sonography with graded compression, as well as color Doppler and duplex ultrasound, were performed to evaluate the deep venous system from the level of the common femoral vein through the popliteal and proximal calf veins. Spectral Doppler was utilized to evaluate flow at rest and with distal augmentation maneuvers. COMPARISON:  09/08/2015 FINDINGS: Right common femoral vein is patent without thrombus. Normal compressibility, augmentation and color Doppler flow in the left common femoral vein, left femoral vein and left popliteal vein. The left saphenofemoral junction is patent. Left profunda femoral vein is patent without thrombus. Limited evaluation of the left calf veins due to soft tissue swelling. There is subcutaneous edema in the left leg. Prominent inguinal lymph nodes bilaterally. IMPRESSION: Negative for deep venous thrombosis in left lower extremity. Prominent bilateral inguinal lymph nodes. Electronically  Signed   By: Richarda Overlie M.D.   On: 02/07/2016 13:40     CBC  Recent Labs Lab 02/07/16 1907 02/08/16 0404 02/08/16 1042 02/10/16 1025 02/11/16 0111  WBC 5.9 5.7 6.5 7.3  6.2  HGB 10.6* 9.5* 9.5* 9.7* 10.0*  HCT 32.8* 29.4* 29.7* 29.6* 30.9*  PLT 178 161 166 146* 145*  MCV 91.9 92.0 89.9 89.6 91.9  MCH 29.7 29.8 28.9 29.3 29.7  MCHC 32.4 32.4 32.2 32.7 32.3  RDW 18.2* 17.6* 17.7* 18.1* 17.8*  LYMPHSABS  --   --  0.8* 0.8*  --   MONOABS  --   --  0.5 0.9  --   EOSABS  --   --  0.2 0.1  --   BASOSABS  --   --  0.1 0.0  --     Chemistries   Recent Labs Lab 02/07/16 1907 02/08/16 0404 02/08/16 1042 02/10/16 1025 02/10/16 1827 02/10/16 2228  NA  --  139 139 139 143  --   K  --  3.7 3.9 3.6 3.5  --   CL  --  100* 98* 98* 99*  --   CO2  --  30 30 30 30   --   GLUCOSE  --  98 122* 93 84  --   BUN  --  17 18 20 11   --   CREATININE 6.59* 6.96* 7.29* 7.63* 5.03*  --   CALCIUM  --  8.9 9.0 8.9 9.4  --   MG  --   --   --   --   --  1.8   ------------------------------------------------------------------------------------------------------------------ estimated creatinine clearance is 19.6 mL/min (by C-G formula based on Cr of 5.03). ------------------------------------------------------------------------------------------------------------------ No results for input(s): HGBA1C in the last 72 hours. ------------------------------------------------------------------------------------------------------------------ No results for input(s): CHOL, HDL, LDLCALC, TRIG, CHOLHDL, LDLDIRECT in the last 72 hours. ------------------------------------------------------------------------------------------------------------------ No results for input(s): TSH, T4TOTAL, T3FREE, THYROIDAB in the last 72 hours.  Invalid input(s): FREET3 ------------------------------------------------------------------------------------------------------------------ No results for input(s): VITAMINB12, FOLATE,  FERRITIN, TIBC, IRON, RETICCTPCT in the last 72 hours.  Coagulation profile No results for input(s): INR, PROTIME in the last 168 hours.  No results for input(s): DDIMER in the last 72 hours.  Cardiac Enzymes  Recent Labs Lab 02/10/16 2228 02/11/16 0111 02/11/16 0413  TROPONINI 0.04* <0.03 0.06*   ------------------------------------------------------------------------------------------------------------------ Invalid input(s): POCBNP    Assessment & Plan   59 year old year-old male with end-stage renal disease and recent upper GI bleed admitted for angiogram for stealsyndrome.  1. End-stage renal disease on hemodialysis: Continue dialysis as per renal team.  2. Cardiomyopathy EF of 20% with atrial fibrillation: Continue amiodarone and Coreg. No cardiac symptoms   3. Hyperlipidemia: Continue simvastatin  4. Diabetes: Continue sliding scale insulin and renal/diabetic diet.blood glucose stable  5. Charcot's foot with cellulitis of the left leg continue Zosyn as per vascular  6.steal syndrome of the left hand ; surgery as an outpatient. #7 hypertension blood pressure improved. Wide complex tachycardia improved patient is on digoxin, amiodarone, Coreg. Feels better. Patient is stable for discharge as per cardiology note. And he also wants to go home.      Code Status Orders        Start     Ordered   02/07/16 1532  Full code   Continuous     02/07/16 1535    Code Status History    Date Active Date Inactive Code Status Order ID Comments User Context   01/04/2016  7:51 PM 01/10/2016  6:01 PM Full Code [id number]  Houston Siren, MD Inpatient              DVT Prophylaxis   Heparin   Lab Results  Component Value Date   PLT  145* 02/11/2016     Time Spent in minutes   25 min  Dontez Hauss M.D on 02/11/2016 at 12:09 PM  Between 7am to 6pm - Pager - [phone]  After 6pm go to [email or web address] - password [email or web address] [facility]  [facility]    Office  [phone]

## 2016-02-11 NOTE — Progress Notes (Signed)
Patient's IVs removed but he remains on telemetry in preparation to discharge home. AVS given to him and medications reviewed.

## 2016-02-11 NOTE — Anesthesia Postprocedure Evaluation (Deleted)
Anesthesia Post Note  Patient: Dondi Aime  Procedure(s) Performed: Procedure(s) (LRB): LIGATION OF ARTERIOVENOUS  FISTULA (N/A)  Patient location during evaluation: PACU Anesthesia Type: MAC Level of consciousness: awake and alert Pain management: pain level controlled Vital Signs Assessment: post-procedure vital signs reviewed and stable Respiratory status: spontaneous breathing, nonlabored ventilation, respiratory function stable and patient connected to nasal cannula oxygen Cardiovascular status: stable and blood pressure returned to baseline Anesthetic complications: no    Last Vitals:  Filed Vitals:   02/11/16 0700 02/11/16 0800  BP: 120/69 122/80  Pulse: 88 88  Temp:  36.8 C  Resp: 23 32    Last Pain:  Filed Vitals:   02/11/16 0836  PainSc: 0-No pain                 Yevette Edwards

## 2016-02-13 ENCOUNTER — Encounter: Payer: Self-pay | Admitting: Vascular Surgery

## 2016-02-13 LAB — GLUCOSE, CAPILLARY: GLUCOSE-CAPILLARY: 77 mg/dL (ref 65–99)

## 2016-03-08 NOTE — Discharge Summary (Signed)
[facility]    Discharge Summary    Patient ID:  [name] [name] MRN: [id number] DOB/AGE: 59/03/1956 59 y.o.  Admit date: 02/07/2016 Discharge date: 03/08/2016 Date of Surgery: 02/07/2016 - 02/11/2016 Surgeon: Moishe SpiceSurgeon(s): Renford DillsGregory G Timon Geissinger, MD  Admission Diagnosis: Cardiac arrest  Discharge Diagnoses:  Cardiac arrest  Secondary Diagnoses: Past Medical History  Diagnosis Date  . Renal disorder   . Diabetes mellitus without complication (HCC)   . Hypertension   . Hyperlipidemia   . ESRD (end stage renal disease) (HCC)   . Secondary hyperparathyroidism (HCC)   . GERD (gastroesophageal reflux disease)     Procedure(s): LIGATION OF ARTERIOVENOUS  FISTULA INVASIVE LAB ABORTED CASE  Discharged Condition: stable  HPI:  Patient experienced a cardiac arrest while being prepped for a fistulogram.  Code Blue was called and he revived.  He was admitted to the hospital.  He was seen by cardiology, no further cardiac interventin was planned and on HD 1 he wished to go home  Hospital Course:  [name] Muraerry Soy is a 60 y.o. male  Procedure(s): LIGATION OF ARTERIOVENOUS  FISTULA INVASIVE LAB ABORTED CASE  Physical exam: fistula with a good thrill and bruit; left hand cool with sluggish cap refill Post-op woundsNA Pt. Ambulating and taking PO diet without difficulty. Pt pain controlled with PO pain meds. Labs as below Complications:cardiac arrrest  Consults:  Treatment Team:  Lamont DowdySarath Kolluru, MD Adrian SaranSital Mody, MD  Significant Diagnostic Studies: CBC Lab Results  Component Value Date   WBC 6.2 02/11/2016   HGB 10.0* 02/11/2016   HCT 30.9* 02/11/2016   MCV 91.9 02/11/2016   PLT 145* 02/11/2016    BMET    Component Value Date/Time   NA 143 02/10/2016 1827   NA 138 04/23/2013 0656   K 3.5 02/10/2016 1827   K 3.7 10/29/2013 1059   CL 99* 02/10/2016 1827   CL 102 04/23/2013 0656   CO2 30 02/10/2016 1827   CO2 30 04/23/2013 0656   GLUCOSE 84 02/10/2016  1827   GLUCOSE 125* 04/23/2013 0656   BUN 11 02/10/2016 1827   BUN 17 04/23/2013 0656   CREATININE 5.03* 02/10/2016 1827   CREATININE 6.52* 04/23/2013 0656   CALCIUM 9.4 02/10/2016 1827   CALCIUM 10.3* 04/23/2013 0656   GFRNONAA 11* 02/10/2016 1827   GFRNONAA 9* 04/23/2013 0656   GFRAA 13* 02/10/2016 1827   GFRAA 10* 04/23/2013 0656   COAG Lab Results  Component Value Date   INR 1.58 01/06/2016   INR 5.35* 01/05/2016   INR >10.00* 01/04/2016     Disposition:  Discharge to :home  Discharge Instructions    Discharge patient    Complete by:  As directed             Medication List    TAKE these medications        amiodarone 400 MG tablet  Commonly known as:  PACERONE  Take 1 tablet (400 mg total) by mouth daily.     aspirin 81 MG chewable tablet  Chew 81 mg by mouth every evening.     b complex-vitamin c-folic acid 0.8 MG Tabs tablet  Take 1 tablet by mouth daily.     carvedilol 3.125 MG tablet  Commonly known as:  COREG  Take 1 tablet (3.125 mg total) by mouth 2 (two) times daily with a meal.     cephALEXin 500 MG capsule  Commonly known as:  KEFLEX  Take 1 capsule (500 mg total) by mouth 2 (two)  times daily.     digoxin 0.125 MG tablet  Commonly known as:  LANOXIN  Take 1 tablet (0.125 mg total) by mouth daily.     esomeprazole 40 MG capsule  Commonly known as:  NEXIUM  Take 40 mg by mouth daily.     insulin aspart 100 UNIT/ML injection  Commonly known as:  novoLOG  Inject 0-20 Units into the skin 3 (three) times daily with meals as needed for high blood sugar. Pt uses per sliding scale.     sevelamer carbonate 800 MG tablet  Commonly known as:  RENVELA  Take 1,600-3,200 mg by mouth 5 (five) times daily. Pt takes four capsules three times a day with meals and two capsules two times a day with snacks.     simvastatin 20 MG tablet  Commonly known as:  ZOCOR  Take 20 mg by mouth at bedtime.     traMADol 50 MG tablet  Commonly known as:  ULTRAM   Take 1 tablet (50 mg total) by mouth every 6 (six) hours as needed.       Verbal and written Discharge instructions given to the patient. Wound care per Discharge AVS   Signed: [name], MD  03/08/2016, 3:19 PM

## 2016-04-10 ENCOUNTER — Encounter: Payer: Medicare Other | Attending: Internal Medicine | Admitting: Internal Medicine

## 2016-04-10 DIAGNOSIS — I132 Hypertensive heart and chronic kidney disease with heart failure and with stage 5 chronic kidney disease, or end stage renal disease: Secondary | ICD-10-CM | POA: Insufficient documentation

## 2016-04-10 DIAGNOSIS — N186 End stage renal disease: Secondary | ICD-10-CM | POA: Diagnosis not present

## 2016-04-10 DIAGNOSIS — M1990 Unspecified osteoarthritis, unspecified site: Secondary | ICD-10-CM | POA: Diagnosis not present

## 2016-04-10 DIAGNOSIS — D649 Anemia, unspecified: Secondary | ICD-10-CM | POA: Diagnosis not present

## 2016-04-10 DIAGNOSIS — M14672 Charcot's joint, left ankle and foot: Secondary | ICD-10-CM | POA: Insufficient documentation

## 2016-04-10 DIAGNOSIS — I509 Heart failure, unspecified: Secondary | ICD-10-CM | POA: Insufficient documentation

## 2016-04-10 DIAGNOSIS — Z794 Long term (current) use of insulin: Secondary | ICD-10-CM | POA: Insufficient documentation

## 2016-04-10 DIAGNOSIS — E1142 Type 2 diabetes mellitus with diabetic polyneuropathy: Secondary | ICD-10-CM | POA: Insufficient documentation

## 2016-04-10 DIAGNOSIS — I252 Old myocardial infarction: Secondary | ICD-10-CM | POA: Diagnosis not present

## 2016-04-10 DIAGNOSIS — L97321 Non-pressure chronic ulcer of left ankle limited to breakdown of skin: Secondary | ICD-10-CM | POA: Diagnosis present

## 2016-04-11 NOTE — Progress Notes (Signed)
VICKEY, EWBANK ([id number]) Visit Report for 04/10/2016 Abuse/Suicide Risk Screen Details Patient Name: [name] [name], [name] [name] Date of Service: 04/10/2016 9:30 AM Medical Record Patient Account Number: [id number] Number: Treating RN: Phillis Haggis [birth year] (60 y.o. Other Clinician: Date of Birth/Sex: Male) Treating ROBSON, MICHAEL Primary Care Physician: FITZGERALD, [name] Physician/Extender: G Referring Physician: Rockne Coons in Treatment: 0 Abuse/Suicide Risk Screen Items Answer ABUSE/SUICIDE RISK SCREEN: Has anyone close to you tried to hurt or harm you recentlyo No Do you feel uncomfortable with anyone in your familyo No Has anyone forced you do things that you didnot want to doo No Do you have any thoughts of harming yourselfo No Patient displays signs or symptoms of abuse and/or neglect. No Electronic Signature(s) Signed: 04/11/2016 4:35:35 PM By: Alejandro Mulling Entered By: Alejandro Mulling on 04/10/2016 09:52:57 [name] [name] ([id number]) -------------------------------------------------------------------------------- Activities of Daily Living Details Patient Name: [name] [name] Date of Service: 04/10/2016 9:30 AM Medical Record Patient Account Number: [id number] Number: Treating RN: Phillis Haggis 16-Mar-1956 (60 y.o. Other Clinician: Date of Birth/Sex: Male) Treating ROBSON, MICHAEL Primary Care Physician: [name], [name] Physician/Extender: G Referring Physician: Rockne Coons in Treatment: 0 Activities of Daily Living Items Answer Activities of Daily Living (Please select one for each item) Drive Automobile Completely Able Take Medications Completely Able Use Telephone Completely Able Care for Appearance Completely Able Use Toilet Completely Able Bath / Shower Completely Able Dress Self Completely Able Feed Self Completely Able Walk Completely Able Get In / Out Bed Completely Able Housework Completely Able Prepare Meals Need  Assistance Handle Money Completely Able Shop for Self Completely Able Electronic Signature(s) Signed: 04/11/2016 4:35:35 PM By: Alejandro Mulling Entered By: Alejandro Mulling on 04/10/2016 09:53:35 [name] [name] ([id number]) -------------------------------------------------------------------------------- Education Assessment Details Patient Name: [name] [name] Date of Service: 04/10/2016 9:30 AM Medical Record Patient Account Number: [id number] Number: Treating RN: Phillis Haggis July 07, 1956 (60 y.o. Other Clinician: Date of Birth/Sex: Male) Treating ROBSON, MICHAEL Primary Care Physician: [name], [name] Physician/Extender: G Referring Physician: Rockne Coons in Treatment: 0 Primary Learner Assessed: Patient Learning Preferences/Education Level/Primary Language Learning Preference: Explanation, Printed Material Highest Education Level: High School Preferred Language: English Cognitive Barrier Assessment/Beliefs Language Barrier: No Translator Needed: No Memory Deficit: No Emotional Barrier: No Cultural/Religious Beliefs Affecting Medical No Care: Physical Barrier Assessment Impaired Vision: No Impaired Hearing: No Decreased Hand dexterity: No Knowledge/Comprehension Assessment Knowledge Level: High Comprehension Level: High Ability to understand written High instructions: Ability to understand verbal High instructions: Motivation Assessment Anxiety Level: Calm Cooperation: Cooperative Education Importance: Acknowledges Need Interest in Health Problems: Asks Questions Perception: Coherent Willingness to Engage in Self- High Management Activities: Readiness to Engage in Self- High Management Activities: GIOVANIE, LEFEBRE ([id number]) Electronic Signature(s) Signed: 04/11/2016 4:35:35 PM By: Alejandro Mulling Entered By: Alejandro Mulling on 04/10/2016 09:53:58 [name] [name]  ([id number]) -------------------------------------------------------------------------------- Fall Risk Assessment Details Patient Name: [name] [name] Date of Service: 04/10/2016 9:30 AM Medical Record Patient Account Number: [id number] Number: Treating RN: Phillis Haggis 09-12-1956 (60 y.o. Other Clinician: Date of Birth/Sex: Male) Treating ROBSON, MICHAEL Primary Care Physician: FITZGERALD, [name] Physician/Extender: G Referring Physician: Rockne Coons in Treatment: 0 Fall Risk Assessment Items Have you had 2 or more falls in the last 12 monthso 0 No Have you had any fall that resulted in injury in the last 12 monthso 0 No FALL RISK ASSESSMENT: History of falling - immediate or within 3 months 0 No Secondary diagnosis 15 Yes Ambulatory aid None/bed rest/wheelchair/nurse 0 Yes Crutches/cane/walker 0 No Furniture 0 No IV Access/Saline Lock  0 No Gait/Training Normal/bed rest/immobile 0 No Weak 10 Yes Impaired 20 Yes Mental Status Oriented to own ability 0 Yes Electronic Signature(s) Signed: 04/11/2016 4:35:35 PM By: Alejandro MullingPinkerton, Debra Entered By: Alejandro MullingPinkerton, Debra on 04/10/2016 09:54:26 [name] [name], Myron ([id number]) -------------------------------------------------------------------------------- Foot Assessment Details Patient Name: [name] [name], [name] [name] Date of Service: 04/10/2016 9:30 AM Medical Record Patient Account Number: [id number] Number: Treating RN: Phillis Haggisinkerton, Debi 11/20/1956 (60 y.o. Other Clinician: Date of Birth/Sex: Male) Treating ROBSON, MICHAEL Primary Care Physician: FITZGERALD, [name] Physician/Extender: G Referring Physician: Rockne CoonsWESSINGER, LUCY Weeks in Treatment: 0 Foot Assessment Items Site Locations + = Sensation present, - = Sensation absent, C = Callus, U = Ulcer R = Redness, W = Warmth, M = Maceration, PU = Pre-ulcerative lesion F = Fissure, S = Swelling, D = Dryness Assessment Right: Left: Other Deformity: No No Prior Foot Ulcer: No  No Prior Amputation: No No Charcot Joint: No No Ambulatory Status: Ambulatory With Help Assistance Device: Walker Gait: Surveyor, miningUnsteady Electronic Signature(s) Signed: 04/11/2016 4:35:35 PM By: Alejandro MullingPinkerton, Debra Entered By: Alejandro MullingPinkerton, Debra on 04/10/2016 09:56:05 [name] [name], Laurent ([id number] Bury) Difatta, Aurther LoftERRY ([id number]) -------------------------------------------------------------------------------- Nutrition Risk Assessment Details Patient Name: [name] [name], Rohan Date of Service: 04/10/2016 9:30 AM Medical Record Patient Account Number: [id number] Number: Treating RN: Phillis Haggisinkerton, Debi 02/05/1956 (59 y.o. Other Clinician: Date of Birth/Sex: Male) Treating ROBSON, MICHAEL Primary Care Physician: FITZGERALD, [name] Physician/Extender: G Referring Physician: Rockne CoonsWESSINGER, LUCY Weeks in Treatment: 0 Height (in): 75 Weight (lbs): 228 Body Mass Index (BMI): 28.5 Nutrition Risk Assessment Items NUTRITION RISK SCREEN: I have an illness or condition that made me change the kind and/or 2 Yes amount of food I eat I eat fewer than two meals per day 0 No I eat few fruits and vegetables, or milk products 0 No I have three or more drinks of beer, liquor or wine almost every day 0 No I have tooth or mouth problems that make it hard for me to eat 0 No I don't always have enough money to buy the food I need 0 No I eat alone most of the time 1 Yes I take three or more different prescribed or over-the-counter drugs a 1 Yes day Without wanting to, I have lost or gained 10 pounds in the last six 0 No months I am not always physically able to shop, cook and/or feed myself 2 Yes Nutrition Protocols Good Risk Protocol Moderate Risk Protocol Electronic Signature(s) Signed: 04/11/2016 4:35:35 PM By: Alejandro MullingPinkerton, Debra Entered By: Alejandro MullingPinkerton, Debra on 04/10/2016 09:54:44

## 2016-04-12 NOTE — Progress Notes (Signed)
ILIJA, MAXIM ([id number]) Visit Report for 04/10/2016 Chief Complaint Document Details Patient Name: [name] [name], [name] [name] Date of Service: 04/10/2016 9:30 AM Medical Record Patient Account Number: [id number] Number: Treating RN: Phillis Haggis 1956-02-05 (59 y.o. Other Clinician: Date of Birth/Sex: Male) Treating Alazar Cherian Primary Care Physician: [name], [name] Physician/Extender: G Referring Physician: Rockne Coons in Treatment: 0 Information Obtained from: Patient Chief Complaint Right second toe wound and left lateral malleolar wound. is doing fine and has no fresh issues. He does have his appointment with the vascular surgeons. However he does not have an appointment with the orthopedic surgeons yet. he did not come for 2 weeks because of the bad weather. He has no fresh issues. 04/10/16; the patient returns today with a wound over the last 2-3 weeks on the anterior part of his left ankle. Electronic Signature(s) Signed: 04/11/2016 5:01:27 PM By: Baltazar Najjar MD Entered By: Baltazar Najjar on 04/10/2016 12:59:36 [name] [name] ([id number]) -------------------------------------------------------------------------------- Debridement Details Patient Name: [name] [name] Date of Service: 04/10/2016 9:30 AM Medical Record Patient Account Number: [id number] Number: Treating RN: Phillis Haggis October 11, 1956 (59 y.o. Other Clinician: Date of Birth/Sex: Male) Treating Tabari Volkert Primary Care Physician: [name], [name] Physician/Extender: G Referring Physician: Rockne Coons in Treatment: 0 Debridement Performed for Wound #5 Left,Anterior Lower Leg Assessment: Performed By: Physician Maxwell Caul, MD Debridement: Debridement Pre-procedure Yes Verification/Time Out Taken: Start Time: 10:20 Pain Control: Other : lidocaine 4% cream Level: Skin/Subcutaneous Tissue Total Area Debrided (L x 2 (cm) x 0.2 (cm) = 0.4 (cm) W): Tissue and  other Viable, Non-Viable, Exudate, Fibrin/Slough, Subcutaneous material debrided: Instrument: Curette Bleeding: Moderate Hemostasis Achieved: Silver Nitrate End Time: 10:21 Procedural Pain: 0 Post Procedural Pain: 0 Response to Treatment: Procedure was tolerated well Post Debridement Measurements of Total Wound Length: (cm) 2 Width: (cm) 0.3 Depth: (cm) 0.3 Volume: (cm) 0.141 Post Procedure Diagnosis Same as Pre-procedure Electronic Signature(s) Signed: 04/11/2016 4:35:35 PM By: Alejandro Mulling Signed: 04/11/2016 5:01:27 PM By: Baltazar Najjar MD Entered By: Alejandro Mulling on 04/10/2016 10:26:23 [name] [name] ([id number]) -------------------------------------------------------------------------------- HPI Details Patient Name: [name] [name] Date of Service: 04/10/2016 9:30 AM Medical Record Patient Account Number: [id number] Number: Treating RN: Phillis Haggis [birth year] (59 y.o. Other Clinician: Date of Birth/Sex: Male) Treating Yeraldin Litzenberger Primary Care Physician: [name], [name] Physician/Extender: G Referring Physician: Rockne Coons in Treatment: 0 History of Present Illness Location: left fourth toe Quality: Patient reports experiencing a dull pain to affected area(s). Severity: Patient states wound (s) are getting better. Duration: Patient states that they are not certain how long the wound has been present. Timing: Pain in wound is constant (hurts all the time) Context: The wound appeared gradually over time Associated Signs and Symptoms: nil HPI Description: has been doing fine and has no fresh issues. He does say that he is trying to get all his appointments in order and at present he has a appointment with the vascular surgeons. While at home he removes his walking shoe and keeps the leg and foot open. There is no pain and minimal drainage. 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 He tells me that over the last 2-3 weeks he has noticed pain and a wound on the crease of  his left dorsal ankle and he is here for our review events. Because of the deformity in his ankle he has a fracture boot that he uses the walker. This was [facility] at [facility]. [facility] he was in hospital in January and suffered a wide complex cardiac arrest at the time of the distal a gram. He has recovered from this. Electronic Signature(s) Signed: 04/11/2016 5:01:27 PM By: Baltazar Najjar MD Entered By: Baltazar Najjar on 04/10/2016 13:02:48 [name] [name] ([id number]) -------------------------------------------------------------------------------- Physical Exam Details Patient Name: [name] [name] Date of Service: 04/10/2016 9:30 AM Medical Record Patient Account Number: [id number] Number: Treating RN: Phillis Haggis 01/10/1956 (59 y.o. Other Clinician: Date of Birth/Sex: Male) Treating Sarah-Jane Nazario Primary Care Physician: [name], [name] Physician/Extender: G Referring Physician: Rockne Coons in Treatment: 0 Constitutional Sitting or standing Blood Pressure is within target range for patient.. Pulse regular and within target range for patient.Marland Kitchen Respirations regular, non-labored and within target range.. Temperature is normal and within the target range for the patient.. Eyes Conjunctivae clear. No discharge. No icterus.Marland Kitchen Respiratory Respiratory effort is easy and symmetric bilaterally. Rate is normal at rest and on room air.. Bilateral breath sounds are clear and equal in all lobes with no wheezes, rales or rhonchi.. Cardiovascular Heart rhythm and rate regular, without murmur or gallop.. Femoral arteries without bruits and pulses  strong.. Pedal pulses absent bilaterally.. Edema present in both extremities. Chronic venous insufficiency changes bilaterally. Gastrointestinal (GI) Abdomen is soft and non-distended without masses or tenderness. Bowel sounds active in all quadrants.. Lymphatic And palpable in the popliteal or inguinal area. Psychiatric No evidence of depression, anxiety, or agitation. Calm, cooperative, and communicative. Appropriate interactions and affect.. Notes Wound exam; the areas on the crease of his left ankle anteriorly. The ankle itself and the foot are inverted and fixed. This is probably a trauma wound from the fracture boot he wears to walk up. He also had a callus over the lateral malleolus on that side which I thought would turn out to be a wound after its removal although there is nothing underneath this. Electronic Signature(s) Signed: 04/11/2016 5:01:27 PM By: Baltazar Najjar MD Entered By: Baltazar Najjar on 04/10/2016 13:04:51 [name] [name] ([id number]) -------------------------------------------------------------------------------- Physician Orders Details Patient Name: [name] [name] Date of Service: 04/10/2016 9:30 AM Medical Record Patient Account Number: [id number] Number: Treating RN: Phillis Haggis Jul 01, 1956 (59 y.o. Other Clinician: Date of Birth/Sex: Male) Treating Israa Caban Primary Care Physician: [name], [name] Physician/Extender: G Referring Physician: Rockne Coons in Treatment: 0 Verbal / Phone Orders: Yes Clinician: Pinkerton, Debi Read Back and Verified: Yes Diagnosis Coding Wound Cleansing Wound #5 Left,Anterior Lower Leg o Clean wound with Normal Saline. o Cleanse wound with mild soap and water Anesthetic Wound #5 Left,Anterior Lower Leg o Topical Lidocaine 4% cream applied to wound bed prior to debridement Skin Barriers/Peri-Wound Care Wound #5 Left,Anterior Lower Leg o Barrier cream Primary Wound Dressing Wound #5  Left,Anterior Lower Leg o Aquacel Ag Secondary Dressing Wound #5 Left,Anterior Lower Leg o Dry Gauze o Conform/Kerlix o Foam Dressing Change Frequency Wound #5 Left,Anterior Lower Leg o Change dressing every day. Follow-up Appointments Wound #5 Left,Anterior Lower Leg o Return Appointment in 1 week. Edema Control Wound #5 Left,Anterior Lower Leg Monts, Lanell ([id number]) o Elevate legs to the level of the heart and pump ankles as often as possible Off-Loading o Turn and reposition every 2 hours - Stay off foot and try to keep boot off as long as possible.  Electronic Signature(s) Signed: 04/11/2016 4:35:35 PM By: Alejandro Mulling Signed: 04/11/2016 5:01:27 PM By: Baltazar Najjar MD Entered By: Alejandro Mulling on 04/10/2016 10:29:54 [name] [name] ([id number]) -------------------------------------------------------------------------------- Problem List Details Patient Name: [name] [name] Date of Service: 04/10/2016 9:30 AM Medical Record Patient Account Number: [id number] Number: Treating RN: Phillis Haggis 1956/04/26 (59 y.o. 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 limited to 04/10/2016 Yes breakdown of skin E11.42 Type 2 diabetes mellitus with diabetic polyneuropathy 04/10/2016 Yes M14.672 Charcot's joint, left ankle and foot 04/10/2016 Yes Inactive Problems Resolved Problems Electronic Signature(s) Signed: 04/11/2016 5:01:27 PM By: Baltazar Najjar MD Entered By: Baltazar Najjar on 04/10/2016 12:57:56 [name] [name] ([id number]) -------------------------------------------------------------------------------- Progress Note Details Patient Name: [name] [name] Date of Service: 04/10/2016  9:30 AM Medical Record Patient Account Number: [id number] Number: Treating RN: Phillis Haggis [birth year] (59 y.o. Other Clinician: Date of Birth/Sex: Male) Treating Crandall Harvel Primary Care Physician: [name], [name] Physician/Extender: G Referring Physician: Rockne Coons in Treatment: 0 Subjective Chief Complaint Information obtained from Patient Right second toe wound and left lateral malleolar wound. is doing fine and has no fresh issues. He does have his appointment with the vascular surgeons. However he does not have an appointment with the orthopedic surgeons yet. he did not come for 2 weeks because of the bad weather. He has no fresh issues. 04/10/16; the patient returns today with a wound over the last 2-3 weeks on the anterior part of his left ankle. History of Present Illness (HPI) The following HPI elements were documented for the patient's wound: Location: left fourth toe Quality: Patient reports experiencing a dull pain to affected area(s). Severity: Patient states wound (s) are getting better. Duration: Patient states that they are not certain how long the wound has been present. Timing: Pain in wound is constant (hurts all the time) Context: The wound appeared gradually over time Associated Signs and Symptoms: nil has been doing fine and has no fresh issues. He does say that he is trying to get all his appointments in order and at present he has a appointment with the vascular surgeons. While at home he removes his walking shoe and keeps the leg and foot open. There is no pain and minimal drainage. 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 He tells me that over the last 2-3 weeks he has noticed pain and  a wound on the crease of his left dorsal ankle and he is here for our review events. Because of the deformity in his ankle he has a fracture boot that he uses the walker. This was made at [employer] in [facility]. [facility] he was in hospital in January and suffered a wide complex cardiac arrest at the time of the distal a gram. He has recovered from this. Wound History Patient presents with 2 open wounds that have been present for approximately 1 week. Patient has been treating wounds in the following manner: neosporin. Laboratory tests have been performed in the last month. Patient reportedly has not tested positive for an  antibiotic resistant organism. Patient reportedly has not tested positive for osteomyelitis. Patient reportedly has had testing performed to evaluate circulation in Rothbauer, Christian ([id number]) the legs. Patient experiences the following problems associated with their wounds: swelling. Patient History Information obtained from Patient. Allergies No Known Drug Allergies Family History Cancer - Father, Siblings, Diabetes - Father, Siblings, Heart Disease - Father, Hypertension - Father, Kidney Disease - Siblings, Lung Disease - Father, Stroke - Mother, No family history of Hereditary Spherocytosis, Seizures, Thyroid Problems, Tuberculosis. Social History Never smoker, Marital Status - Single, Alcohol Use - Never, Drug Use - No History, Caffeine Use - Moderate. Medical History Cardiovascular Patient has history of Arrhythmia - A-FIB, Myocardial Infarction Medical And Surgical History Notes Musculoskeletal arthritis Review of Systems (ROS) Gastrointestinal ulcers (bleeding) Objective Constitutional Sitting or standing Blood Pressure is within target range for patient.. Pulse regular and within target range for patient.Marland Kitchen. Respirations regular, non-labored and within target range.. Temperature is normal and within the target range for the  patient.. Vitals Time Taken: 9:47 AM, Height: 75 in, Source: Stated, Weight: 228 lbs, Source: Stated, BMI: 28.5, Temperature: 98.0 F, Pulse: 86 bpm, Respiratory Rate: 18 breaths/min, Blood Pressure: 123/77 mmHg. Eyes Conjunctivae clear. No discharge. No icterus.Marland Kitchen. Respiratory Buening, Kimarion ([id number]) Respiratory effort is easy and symmetric bilaterally. Rate is normal at rest and on room air.. Bilateral breath sounds are clear and equal in all lobes with no wheezes, rales or rhonchi.. Cardiovascular Heart rhythm and rate regular, without murmur or gallop.. Femoral arteries without bruits and pulses strong.. Pedal pulses absent bilaterally.. Edema present in both extremities. Chronic venous insufficiency changes bilaterally. Gastrointestinal (GI) Abdomen is soft and non-distended without masses or tenderness. Bowel sounds active in all quadrants.. Lymphatic And palpable in the popliteal or inguinal area. Psychiatric No evidence of depression, anxiety, or agitation. Calm, cooperative, and communicative. Appropriate interactions and affect.. General Notes: Wound exam; the areas on the crease of his left ankle anteriorly. The ankle itself and the foot are inverted and fixed. This is probably a trauma wound from the fracture boot he wears to walk up. He also had a callus over the lateral malleolus on that side which I thought would turn out to be a wound after its removal although there is nothing underneath this. Integumentary (Hair, Skin) Wound #5 status is Open. Original cause of wound was Gradually Appeared. The wound is located on the Left,Anterior Lower Leg. The wound measures 2cm length x 0.2cm width x 0.2cm depth; 0.314cm^2 area and 0.063cm^3 volume. The wound is limited to skin breakdown. There is no tunneling or undermining noted. There is a none present amount of drainage noted. The wound margin is thickened. There is no granulation within the wound bed. There is a large (67-100%)  amount of necrotic tissue within the wound bed including Eschar. The periwound skin appearance exhibited: Dry/Scaly. Periwound temperature was noted as No Abnormality. The periwound has tenderness on palpation. 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 breakdown of skin E11.42 - Type 2 diabetes mellitus with diabetic polyneuropathy M14.672 - Charcot's joint, left ankle and foot Tkach, Joesiah ([id number]) Procedures Wound #5 Wound #5 is a To be determined located on the Left,Anterior Lower Leg . There was a Skin/Subcutaneous Tissue Debridement ([id number]) debridement with total area of 0.4 sq cm performed by Maxwell CaulOBSON, Alexica Schlossberg G, MD. with the following instrument(s): Curette to remove Viable and Non-Viable tissue/material including Exudate, Fibrin/Slough, and Subcutaneous after achieving pain control using Other (lidocaine 4% cream).  A time out was conducted prior to the start of the procedure. A Moderate amount of bleeding was controlled with Silver Nitrate. The procedure was tolerated well with a pain level of 0 throughout and a pain level of 0 following the procedure. Post Debridement Measurements: 2cm length x 0.3cm width x 0.3cm depth; 0.141cm^3 volume. Post procedure Diagnosis Wound #5: Same as Pre-Procedure Plan Wound Cleansing: Wound #5 Left,Anterior Lower Leg: Clean wound with Normal Saline. Cleanse wound with mild soap and water Anesthetic: Wound #5 Left,Anterior Lower Leg: Topical Lidocaine 4% cream applied to wound bed prior to debridement Skin Barriers/Peri-Wound Care: Wound #5 Left,Anterior Lower Leg: Barrier cream Primary Wound Dressing: Wound #5 Left,Anterior Lower Leg: Aquacel Ag Secondary Dressing: Wound #5 Left,Anterior Lower Leg: Dry Gauze Conform/Kerlix Foam Dressing Change Frequency: Wound #5 Left,Anterior Lower Leg: Change dressing every day. Follow-up Appointments: Wound #5 Left,Anterior Lower  Leg: Return Appointment in 1 week. Edema Control: Wound #5 Left,Anterior Lower Leg: Elevate legs to the level of the heart and pump ankles as often as possible Off-Loading: Turn and reposition every 2 hours - Stay off foot and try to keep boot off as long as possible. Aslin, Erving ([id number]) #1 probably a friction wound from his fracture boot today. We use silver alginate and foam over this which he can change every second day #2 He has follow with Dr Gilda Crease of vascular surgery but I can't see vascular studies on his lower extremities. He has had a history of wounds between his toes on the left side and was seen in this clinic. His ABIs are noncompressible. #3 I will suspect that he'll need to go back [facility] to have them look at this once we get this wound healed. He tells me that he does not walk much with this wound is using a wheelchair in the house. Only on his feet to go to dialysis Electronic Signature(s) Signed: 04/11/2016 5:01:27 PM By: Baltazar Najjar MD Entered By: Baltazar Najjar on 04/10/2016 13:06:38 [name] [name] ([id number]) -------------------------------------------------------------------------------- ROS/PFSH Details Patient Name: [name] [name] Date of Service: 04/10/2016 9:30 AM Medical Record Patient Account Number: [id number] Number: Treating RN: Phillis Haggis 11/06/1956 (59 y.o. Other Clinician: Date of Birth/Sex: Male) Treating Deyci Gesell Primary Care Physician: FITZGERALD, [name] Physician/Extender: G Referring Physician: Rockne Coons in Treatment: 0 Information Obtained From Patient Wound History Do you currently have one or more open woundso Yes How many open wounds do you currently haveo 2 Approximately how long have you had your woundso 1 week How have you been treating your wound(s) until nowo neosporin Has your wound(s) ever healed and then re-openedo No Have you had any lab work done in the past montho Yes Who ordered  the lab work [facility] [employer] Have you tested positive for an antibiotic resistant organism (MRSA, VRE)o No Have you tested positive for osteomyelitis (bone infection)o No Have you had any tests for circulation on your legso Yes Who ordered the testo Dr. Gilda Crease Where was the test doneo [facility] Have you had other problems associated with your woundso Swelling Eyes Medical History: Positive for: Cataracts - both eyes Negative for: Glaucoma; Optic Neuritis Ear/Nose/Mouth/Throat Medical History: Negative for: Chronic sinus problems/congestion; Middle ear problems Hematologic/Lymphatic Medical History: Positive for: Anemia Negative for: Hemophilia; Human Immunodeficiency Virus; Lymphedema; Sickle Cell Disease Respiratory Medical History: Negative for: Aspiration; Asthma; Chronic Obstructive Pulmonary Disease (COPD); Pneumothorax; Sleep Apnea; Tuberculosis Schneller, Robbert ([id number]) Cardiovascular Medical History: Positive for: Arrhythmia - A-FIB; Congestive Heart Failure; Coronary Artery Disease; Hypertension; Myocardial Infarction  Negative for: Angina; Deep Vein Thrombosis; Hypotension; Peripheral Arterial Disease; Peripheral Venous Disease; Phlebitis; Vasculitis Gastrointestinal Complaints and Symptoms: Review of System Notes: ulcers (bleeding) Medical History: Negative for: Cirrhosis ; Colitis; Crohnos; Hepatitis A; Hepatitis B; Hepatitis C Endocrine Medical History: Positive for: Type II Diabetes Negative for: Type I Diabetes Time with diabetes: 20 years Treated with: Insulin Blood sugar tested every day: Yes Tested : bid Genitourinary Medical History: Positive for: End Stage Renal Disease Immunological Medical History: Negative for: Lupus Erythematosus; Raynaudos; Scleroderma Integumentary (Skin) Medical History: Negative for: History of Burn; History of pressure wounds Musculoskeletal Medical History: Positive for: Osteoarthritis Negative for: Gout; Rheumatoid Arthritis;  Osteomyelitis Past Medical History Notes: arthritis Neurologic Ragen, Eben ([id number]) Medical History: Positive for: Neuropathy Negative for: Dementia; Quadriplegia; Paraplegia; Seizure Disorder Oncologic Medical History: Negative for: Received Chemotherapy; Received Radiation Psychiatric Medical History: Negative for: Anorexia/bulimia; Confinement Anxiety HBO Extended History Items Eyes: Cataracts Family and Social History Cancer: Yes - Father, Siblings; Diabetes: Yes - Father, Siblings; Heart Disease: Yes - Father; Hereditary Spherocytosis: No; Hypertension: Yes - Father; Kidney Disease: Yes - Siblings; Lung Disease: Yes - Father; Seizures: No; Stroke: Yes - Mother; Thyroid Problems: No; Tuberculosis: No; Never smoker; Marital Status - Single; Alcohol Use: Never; Drug Use: No History; Caffeine Use: Moderate; Financial Concerns: No; Food, Clothing or Shelter Needs: No; Support System Lacking: No; Transportation Concerns: No; Advanced Directives: Yes (Not Provided); Patient does not want information on Advanced Directives; Do not resuscitate: No; Living Will: Yes (Not Provided); Medical Power of Attorney: Yes (Not Provided) Electronic Signature(s) Signed: 04/11/2016 4:35:35 PM By: Alejandro Mulling Signed: 04/11/2016 5:01:27 PM By: Baltazar Najjar MD Entered By: Alejandro Mulling on 04/10/2016 09:52:47 [name] [name] ([id number]) -------------------------------------------------------------------------------- SuperBill Details Patient Name: [name] [name] Date of Service: 04/10/2016 Medical Record Patient Account Number: [id number] Number: Treating RN: Phillis Haggis 1956-07-29 (59 y.o. 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

## 2016-04-12 NOTE — Progress Notes (Signed)
[name] [name], [name] [name] ([id number]) Visit Report for 04/10/2016 Allergy List Details Patient Name: [name] [name], [name] [name] Date of Service: 04/10/2016 9:30 AM Medical Record Number: [id number] Patient Account Number: [id number] Date of Birth/Sex: 07/22/1956 [age] y.o. Male) Treating RN: Phillis HaggisPinkerton, Debi Primary Care Physician: Sampson GoonFITZGERALD, [name] Other Clinician: Referring Physician: Kennith GainWESSINGER, LUCY Treating Physician/Extender: Maxwell CaulOBSON, MICHAEL G Weeks in Treatment: 0 Allergies Active Allergies No Known Drug Allergies Allergy Notes Electronic Signature(s) Signed: 04/11/2016 4:35:35 PM By: Alejandro MullingPinkerton, Debra Entered By: Alejandro MullingPinkerton, Debra on 04/10/2016 09:49:43 [name] [name], [name] [name] ([id number]) -------------------------------------------------------------------------------- Arrival Information Details Patient Name: [name] [name], [name] [name] Date of Service: 04/10/2016 9:30 AM Medical Record Number: [id number] Patient Account Number: [id number] Date of Birth/Sex: 07/17/1956 [age] y.o. Male) Treating RN: Phillis HaggisPinkerton, Debi Primary Care Physician: Sampson GoonFITZGERALD, [name] Other Clinician: Referring Physician: Kennith GainWESSINGER, LUCY Treating Physician/Extender: Altamese CarolinaOBSON, MICHAEL G Weeks in Treatment: 0 Visit Information Patient Arrived: Wheel Chair Arrival Time: 09:46 Accompanied By: self Transfer Assistance: EasyPivot Patient Lift Patient Identification Verified: Yes Secondary Verification Process Yes Completed: Patient Requires Transmission- No Based Precautions: Patient Has Alerts: Yes Patient Alerts: DM II Dialysis Pt ABI L non- compress History Since Last Visit All ordered tests and consults were completed: No Added or deleted any medications: No Any new allergies or adverse reactions: No Had a fall or experienced change in activities of daily living that may affect risk of falls: No Signs or symptoms of abuse/neglect since last visito No Hospitalized since last visit: No Pain Present Now: No Electronic Signature(s) Signed: 04/11/2016  4:35:35 PM By: Alejandro MullingPinkerton, Debra Entered By: Alejandro MullingPinkerton, Debra on 04/10/2016 10:03:51 [name] [name], [name] [name] ([id number]) -------------------------------------------------------------------------------- Encounter Discharge Information Details Patient Name: [name] [name], [name] [name] Date of Service: 04/10/2016 9:30 AM Medical Record Number: [id number] Patient Account Number: [id number] Date of Birth/Sex: 03/09/1956 [age] y.o. Male) Treating RN: Phillis HaggisPinkerton, Debi Primary Care Physician: Sampson GoonFITZGERALD, [name] Other Clinician: Referring Physician: Kennith GainWESSINGER, LUCY Treating Physician/Extender: Altamese CarolinaOBSON, MICHAEL G Weeks in Treatment: 0 Encounter Discharge Information Items Discharge Pain Level: 0 Discharge Condition: Stable Ambulatory Status: Wheelchair Discharge Destination: Home Transportation: Private Auto Accompanied By: self Schedule Follow-up Appointment: Yes Medication Reconciliation completed and provided to Patient/Care No Tymel Conely: Provided on Clinical Summary of Care: 04/10/2016 Form Type Recipient Paper Patient TA Electronic Signature(s) Signed: 04/10/2016 10:45:31 AM By: Gwenlyn PerkingMoore, Shelia Entered By: Gwenlyn PerkingMoore, Shelia on 04/10/2016 10:45:31 [name] [name], [name] [name] ([id number]) -------------------------------------------------------------------------------- Lower Extremity Assessment Details Patient Name: [name] [name], [name] [name] Date of Service: 04/10/2016 9:30 AM Medical Record Number: [id number] Patient Account Number: [id number] Date of Birth/Sex: 12/28/1955 [age] y.o. Male) Treating RN: Phillis HaggisPinkerton, Debi Primary Care Physician: FITZGERALD, [name] Other Clinician: Referring Physician: Kennith GainWESSINGER, LUCY Treating Physician/Extender: Altamese CarolinaOBSON, MICHAEL G Weeks in Treatment: 0 Edema Assessment Assessed: [Left: No] [Right: No] E[Left: dema] [Right: :] Calf Left: Right: Point of Measurement: 34 cm From Medial Instep 42.5 cm cm Ankle Left: Right: Point of Measurement: 10 cm From Medial Instep 28.7 cm cm Vascular Assessment Pulses: Posterior  Tibial Dorsalis Pedis Palpable: [Left:No] Doppler: [Left:Monophasic] Extremity colors, hair growth, and conditions: Extremity Color: [Left:Hyperpigmented] Temperature of Extremity: [Left:Warm] Capillary Refill: [Left:< 3 seconds] Toe Nail Assessment Left: Right: Thick: No Discolored: Yes Deformed: Yes Improper Length and Hygiene: No Notes ABI left leg non-compressible Electronic Signature(s) Signed: 04/11/2016 4:35:35 PM By: Alejandro MullingPinkerton, Debra Entered By: Alejandro MullingPinkerton, Debra on 04/10/2016 10:03:31 [name] [name], Marshell ([id number] Bury) Grunert, Aurther LoftERRY ([id number]) -------------------------------------------------------------------------------- Multi Wound Chart Details Patient Name: [name] [name], [name] [name] Date of Service: 04/10/2016 9:30 AM Medical Record Number: [id number] Patient Account Number: [id number] Date of Birth/Sex: 07/23/1956 [age] y.o. Male) Treating RN: Phillis HaggisPinkerton, Debi Primary Care Physician: Sampson GoonFITZGERALD, [name] Other Clinician: Referring Physician: Kennith GainWESSINGER, LUCY  Treating Physician/Extender: Maxwell Caul Weeks in Treatment: 0 Vital Signs Height(in): 75 Pulse(bpm): 86 Weight(lbs): 228 Blood Pressure 123/77 (mmHg): Body Mass Index(BMI): 28 Temperature(F): 98.0 Respiratory Rate 18 (breaths/min): Photos: [5:No Photos] [N/A:N/A] Wound Location: [5:Left Lower Leg - Anterior N/A] Wounding Event: [5:Gradually Appeared] [N/A:N/A] Primary Etiology: [5:To be determined] [N/A:N/A] Comorbid History: [5:Cataracts, Anemia, Arrhythmia, Congestive Heart Failure, Coronary Artery Disease, Hypertension, Myocardial Infarction, Type II Diabetes, End Stage Renal Disease, Osteoarthritis, Neuropathy] [N/A:N/A] Date Acquired: [5:04/03/2016] [N/A:N/A] Weeks of Treatment: [5:0] [N/A:N/A] Wound Status: [5:Open] [N/A:N/A] Measurements L x W x D 2x0.2x0.2 [N/A:N/A] (cm) Area (cm) : [5:0.314] [N/A:N/A] Volume (cm) : [5:0.063] [N/A:N/A] Classification: [5:Partial Thickness] [N/A:N/A] HBO Classification:  [5:Grade 1] [N/A:N/A] Exudate Amount: [5:None Present] [N/A:N/A] Wound Margin: [5:Thickened] [N/A:N/A] Granulation Amount: [5:None Present (0%)] [N/A:N/A] Necrotic Amount: [5:Large (67-100%)] [N/A:N/A] Necrotic Tissue: [5:Eschar] [N/A:N/A] Exposed Structures: [5:Fascia: No Fat: No Tendon: No] [N/A:N/A] Muscle: No Joint: No Bone: No Limited to Skin Breakdown Epithelialization: None N/A N/A Periwound Skin Texture: No Abnormalities Noted N/A N/A Periwound Skin Dry/Scaly: Yes N/A N/A Moisture: Periwound Skin Color: No Abnormalities Noted N/A N/A Temperature: No Abnormality N/A N/A Tenderness on Yes N/A N/A Palpation: Wound Preparation: Ulcer Cleansing: N/A N/A Rinsed/Irrigated with Saline Topical Anesthetic Applied: Other: lidocaine 4% Treatment Notes Electronic Signature(s) Signed: 04/11/2016 4:35:35 PM By: Alejandro Mulling Entered By: Alejandro Mulling on 04/10/2016 10:07:56 [name] Mura ([id number]) -------------------------------------------------------------------------------- Multi-Disciplinary Care Plan Details Patient Name: [name] Mura Date of Service: 04/10/2016 9:30 AM Medical Record Number: [id number] Patient Account Number: [id number] Date of Birth/Sex: 05/28/1956 (60 y.o. Male) Treating RN: Phillis Haggis Primary Care Physician: [name], [name] Other Clinician: Referring Physician: Kennith Gain Treating Physician/Extender: Altamese Cecil in Treatment: 0 Active Inactive Electronic Signature(s) Signed: 04/11/2016 4:35:35 PM By: Alejandro Mulling Entered By: Alejandro Mulling on 04/10/2016 10:07:50 [name] Mura ([id number]) -------------------------------------------------------------------------------- Pain Assessment Details Patient Name: [name] Mura Date of Service: 04/10/2016 9:30 AM Medical Record Number: [id number] Patient Account Number: [id number] Date of Birth/Sex: Apr 04, 1956 (60 y.o. Male) Treating RN: Phillis Haggis Primary Care  Physician: [name], [name] Other Clinician: Referring Physician: Kennith Gain Treating Physician/Extender: Altamese Eunice in Treatment: 0 Active Problems Location of Pain Severity and Description of Pain Patient Has Paino No Site Locations Pain Management and Medication Current Pain Management: Electronic Signature(s) Signed: 04/11/2016 4:35:35 PM By: Alejandro Mulling Entered By: Alejandro Mulling on 04/10/2016 09:47:35 [name] Mura ([id number]) -------------------------------------------------------------------------------- Patient/Caregiver Education Details Patient Name: [name] Mura Date of Service: 04/10/2016 9:30 AM Medical Record Number: [id number] Patient Account Number: [id number] Date of Birth/Gender: 1955/12/26 (60 y.o. Male) Treating RN: Phillis Haggis Primary Care Physician: [name], [name] Other Clinician: Referring Physician: Kennith Gain Treating Physician/Extender: Altamese [place] in Treatment: 0 Education Assessment Education Provided To: Patient Education Topics Provided Wound/Skin Impairment: Handouts: Other: change dressing as ordered Methods: Demonstration, Explain/Verbal Responses: State content correctly Electronic Signature(s) Signed: 04/11/2016 4:35:35 PM By: Alejandro Mulling Entered By: Alejandro Mulling on 04/10/2016 10:31:18 [name] Mura ([id number]) -------------------------------------------------------------------------------- Wound Assessment Details Patient Name: [name] Mura Date of Service: 04/10/2016 9:30 AM Medical Record Number: [id number] Patient Account Number: [id number] Date of Birth/Sex: 06-11-1956 (60 y.o. Male) Treating RN: Phillis Haggis Primary Care Physician: [name], [name] Other Clinician: Referring Physician: Kennith Gain Treating Physician/Extender: Altamese  in Treatment: 0 Wound Status Wound Number: 5 Primary To be determined Etiology: Wound Location: Left Lower Leg -  Anterior Wound Open Wounding Event: Gradually Appeared Status: Date Acquired: 04/03/2016 Comorbid Cataracts, Anemia, Arrhythmia, Weeks Of Treatment: 0 History: Congestive Heart Failure, Coronary Clustered Wound:  No Artery Disease, Hypertension, Myocardial Infarction, Type II Diabetes, End Stage Renal Disease, Osteoarthritis, Neuropathy Photos Photo Uploaded By: Alejandro Mulling on 04/10/2016 15:05:09 Wound Measurements Length: (cm) 2 Width: (cm) 0.2 Depth: (cm) 0.2 Area: (cm) 0.314 Volume: (cm) 0.063 % Reduction in Area: % Reduction in Volume: Epithelialization: None Tunneling: No Undermining: No Wound Description Classification: Partial Thickness Foul Odor Diabetic Severity [address]): Grade 1 Wound Margin: Thickened Exudate Amount: None Present After Cleansing: No Wound Bed Granulation Amount: None Present (0%) Exposed Structure Necrotic Amount: Large (67-100%) Fascia Exposed: No [place], Spyridon ([id number]) Necrotic Quality: Eschar Fat Layer Exposed: No Tendon Exposed: No Muscle Exposed: No Joint Exposed: No Bone Exposed: No Limited to Skin Breakdown Periwound Skin Texture Texture Color No Abnormalities Noted: No No Abnormalities Noted: No Moisture Temperature / Pain No Abnormalities Noted: No Temperature: No Abnormality Dry / Scaly: Yes Tenderness on Palpation: Yes Wound Preparation Ulcer Cleansing: Rinsed/Irrigated with Saline Topical Anesthetic Applied: Other: lidocaine 4%, Treatment Notes Wound #5 (Left, Anterior Lower Leg) 1. Cleansed with: Clean wound with Normal Saline Cleanse wound with antibacterial soap and water 2. Anesthetic Topical Lidocaine 4% cream to wound bed prior to debridement 3. Peri-wound Care: Barrier cream 4. Dressing Applied: Aquacel Ag 5. Secondary Dressing Applied Dry Gauze Foam Kerlix/Conform 7. Secured with Secretary/administrator) Signed: 04/11/2016 4:35:35 PM By: Alejandro Mulling Entered By: Alejandro Mulling  on 04/10/2016 10:06:56 [name] Mura ([id number]) -------------------------------------------------------------------------------- Vitals Details Patient Name: [name] Mura Date of Service: 04/10/2016 9:30 AM Medical Record Number: [id number] Patient Account Number: [id number] Date of Birth/Sex: 28-Nov-1956 (60 y.o. Male) Treating RN: Phillis Haggis Primary Care Physician: FITZGERALD, [name] Other Clinician: Referring Physician: Kennith Gain Treating Physician/Extender: Altamese [place] in Treatment: 0 Vital Signs Time Taken: 09:47 Temperature (F): 98.0 Height (in): 75 Pulse (bpm): 86 Source: Stated Respiratory Rate (breaths/min): 18 Weight (lbs): 228 Blood Pressure (mmHg): 123/77 Source: Stated Reference Range: 80 - 120 mg / dl Body Mass Index (BMI): 28.5 Electronic Signature(s) Signed: 04/11/2016 4:35:35 PM By: Alejandro Mulling Entered By: Alejandro Mulling on 04/10/2016 09:49:11

## 2016-04-17 ENCOUNTER — Encounter: Payer: Medicare Other | Admitting: Internal Medicine

## 2016-04-17 DIAGNOSIS — L97321 Non-pressure chronic ulcer of left ankle limited to breakdown of skin: Secondary | ICD-10-CM | POA: Diagnosis not present

## 2016-04-19 NOTE — Progress Notes (Signed)
[name] [name], Nai ([id number]) Visit Report for 04/17/2016 Chief Complaint Document Details Patient Name: [name] [name], Coltan Date of Service: 04/17/2016 10:45 AM Medical Record Patient Account Number: [id number] Number: Treating RN: Phillis Haggisinkerton, Debi 03/03/1956 (59 y.o. Other Clinician: Date of Birth/Sex: Male) Treating Danelle Curiale Primary Care Physician: Sampson GoonFITZGERALD, [name] Physician/Extender: G Referring Physician: FITZGERALD, [name] Weeks in Treatment: 1 Information Obtained from: Patient Chief Complaint Right second toe wound and left lateral malleolar wound. is doing fine and has no fresh issues. He does have his appointment with the vascular surgeons. However he does not have an appointment with the orthopedic surgeons yet. he did not come for 2 weeks because of the bad weather. He has no fresh issues. 04/10/16; the patient returns today with a wound over the last 2-3 weeks on the anterior part of his left ankle. Electronic Signature(s) Signed: 04/19/2016 7:59:30 AM By: Baltazar Najjarobson, Magenta Schmiesing MD Entered By: Baltazar Najjarobson, Orland Visconti on 04/17/2016 11:34:19 [name] [name], Warnie ([id number]) -------------------------------------------------------------------------------- HPI Details Patient Name: [name] [name], Jacolby Date of Service: 04/17/2016 10:45 AM Medical Record Patient Account Number: [id number] Number: Treating RN: Phillis Haggisinkerton, Debi 03/17/1956 (59 y.o. Other Clinician: Date of Birth/Sex: Male) Treating Cheo Selvey Primary Care Physician: Sampson GoonFITZGERALD, [name] Physician/Extender: G Referring Physician: FITZGERALD, [name] Weeks in Treatment: 1 History of Present Illness Location: left fourth toe Quality: Patient reports experiencing a dull pain to affected area(s). Severity: Patient states wound (s) are getting better. Duration: Patient states that they are not certain how long the wound has been present. Timing: Pain in wound is constant (hurts all the time) Context: The wound appeared gradually  over time Associated Signs and Symptoms: nil HPI Description: has been doing fine and has no fresh issues. He does say that he is trying to get all his appointments in order and at present he has a appointment with the vascular surgeons. While at home he removes his walking shoe and keeps the leg and foot open. There is no pain and minimal drainage. 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 He tells me that over the last 2-3 weeks he has noticed pain and a wound on the crease of his left dorsal ankle and he is here for our review events. Because of the deformity in his ankle he has a fracture boot that he uses the walker. [facility] [place] he was in hospital in January and suffered a wide complex cardiac arrest at the time of the fistulagram. He has recovered from this. 04/17/16 a his left dorsal ankle appears to be improved. He has an appointment with [employer] to adjust his boot next week. He does not appear to have significant PAD but does have significant venous insufficiency Electronic Signature(s) Signed: 04/19/2016 7:59:30 AM By: Baltazar Najjarobson, Isidora Laham MD Entered By: Baltazar Najjarobson, Senie Lanese on 04/17/2016 11:35:32 [name] [name], Uriah ([id number]) -------------------------------------------------------------------------------- Physical Exam Details Patient Name: [name] [name], Xiomar Date of Service: 04/17/2016 10:45 AM Medical Record Patient Account Number: [id number] Number: Treating RN: Phillis Haggisinkerton, Debi 04/10/1956 (59 y.o. Other Clinician: Date of Birth/Sex: Male) Treating Katrina Daddona Primary Care Physician: Sampson GoonFITZGERALD, [name] Physician/Extender: G Referring Physician: FITZGERALD, [name] Weeks  in Treatment: 1 Constitutional Sitting or standing Blood Pressure is within target range for patient.Marland Kitchen. .Marland Kitchen  Pulse regular and within target range for patient.Marland Kitchen Respirations regular, non-labored and within target range.. Patient's appearance is neat and clean. Appears in no acute distress. Well nourished and well developed.. Cardiovascular Heart rhythm and rate regular, without murmur or gallop.. Pedal pulses palpable and strong bilaterally.. Venous insufficiency and lymphedema in the left leg. Gastrointestinal (GI) Abdomen is soft and non-distended without masses or tenderness. Bowel sounds active in all quadrants.. No liver or spleen enlargement or tenderness.. Notes Wound exam; the areas on the left ankle dorsally. He has a fixed inversion of the foot and ankle which is not different from last week. Chronic venous insufficiency probably some degree of lymphedema. The wound itself looks better there is maybe some mild surface slough that may require debridement, if this is not closed next week I will perform this then Electronic Signature(s) Signed: 04/19/2016 7:59:30 AM By: Baltazar Najjar MD Entered By: Baltazar Najjar on 04/17/2016 11:37:32 [name] [name] ([id number]) -------------------------------------------------------------------------------- Physician Orders Details Patient Name: [name] [name] Date of Service: 04/17/2016 10:45 AM Medical Record Patient Account Number: [id number] Number: Treating RN: Phillis Haggis June 09, 1956 (59 y.o. Other Clinician: Date of Birth/Sex: Male) Treating Darletta Noblett Primary Care Physician: [name], [name] Physician/Extender: G Referring Physician: FITZGERALD, [name] Weeks in Treatment: 1 Verbal / Phone Orders: Yes Clinician: Pinkerton, Debi Read Back and Verified: Yes Diagnosis Coding ICD-10 Coding Code Description L97.321 Non-pressure chronic ulcer of left ankle limited to breakdown of skin E11.42 Type 2 diabetes mellitus with  diabetic polyneuropathy M14.672 Charcot's joint, left ankle and foot Wound Cleansing Wound #5 Left,Anterior Lower Leg o Clean wound with Normal Saline. o Cleanse wound with mild soap and water Anesthetic Wound #5 Left,Anterior Lower Leg o Topical Lidocaine 4% cream applied to wound bed prior to debridement Skin Barriers/Peri-Wound Care Wound #5 Left,Anterior Lower Leg o Barrier cream Primary Wound Dressing Wound #5 Left,Anterior Lower Leg o Aquacel Ag Secondary Dressing Wound #5 Left,Anterior Lower Leg o Dry Gauze o Conform/Kerlix o Foam Dressing Change Frequency Wound #5 Left,Anterior Lower Leg o Change dressing every other day. OLVIN, ROHR ([id number]) Follow-up Appointments Wound #5 Left,Anterior Lower Leg o Return Appointment in 1 week. Edema Control Wound #5 Left,Anterior Lower Leg o Elevate legs to the level of the heart and pump ankles as often as possible Off-Loading o Turn and reposition every 2 hours - Stay off foot and try to keep boot off as long as possible. Electronic Signature(s) Signed: 04/17/2016 5:04:44 PM By: Alejandro Mulling Signed: 04/19/2016 7:59:30 AM By: Baltazar Najjar MD Entered By: Alejandro Mulling on 04/17/2016 11:54:54 [name] [name] ([id number]) -------------------------------------------------------------------------------- Problem List Details Patient Name: [name] [name] Date of Service: 04/17/2016 10:45 AM Medical Record Patient Account Number: [id number] Number: Treating RN: Phillis Haggis 06/16/1956 (59 y.o. Other Clinician: Date of Birth/Sex: Male) Treating Thao Vanover Primary Care Physician: [name], [name] Physician/Extender: G Referring Physician: FITZGERALD, [name] Weeks in Treatment: 1 Active Problems ICD-10 Encounter Code Description Active Date Diagnosis L97.321 Non-pressure chronic ulcer of left ankle limited to 04/10/2016 Yes breakdown of skin E11.42 Type 2 diabetes mellitus with  diabetic polyneuropathy 04/10/2016 Yes M14.672 Charcot's joint, left ankle and foot 04/10/2016 Yes Inactive Problems Resolved Problems Electronic Signature(s) Signed: 04/19/2016 7:59:30 AM By: Baltazar Najjar MD Entered By: Baltazar Najjar on 04/17/2016 11:33:40 [name] [name] ([id number]) -------------------------------------------------------------------------------- Progress Note Details Patient Name: [name] [name] Date of Service: 04/17/2016 10:45 AM Medical Record Patient Account Number: [id number] Number: Treating RN: Phillis Haggis Nov 30, 1956 (59 y.o. Other Clinician: Date of Birth/Sex: Male) Treating Brace Welte Primary Care Physician:  [name], [name] Physician/Extender: G Referring Physician: FITZGERALD, [name] Weeks in Treatment: 1 Subjective Chief Complaint Information obtained from Patient Right second toe wound and left lateral malleolar wound. is doing fine and has no fresh issues. He does have his appointment with the vascular surgeons. However he does not have an appointment with the orthopedic surgeons yet. he did not come for 2 weeks because of the bad weather. He has no fresh issues. 04/10/16; the patient returns today with a wound over the last 2-3 weeks on the anterior part of his left ankle. History of Present Illness (HPI) The following HPI elements were documented for the patient's wound: Location: left fourth toe Quality: Patient reports experiencing a dull pain to affected area(s). Severity: Patient states wound (s) are getting better. Duration: Patient states that they are not certain how long the wound has been present. Timing: Pain in wound is constant (hurts all the time) Context: The wound appeared gradually over time Associated Signs and Symptoms: nil has been doing fine and has no fresh issues. He does say that he is trying to get all his appointments in order and at present he has a appointment with the vascular surgeons. While at home he  removes his walking shoe and keeps the leg and foot open. There is no pain and minimal drainage. 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 He tells me that over the last 2-3 weeks he has noticed pain and a wound on the crease of his left dorsal ankle and he is here for our review events. Because of the deformity in his ankle he has a fracture boot that he uses the walker. This was made at [facility] in [facility]. [facility] he was in hospital in January and suffered a wide complex cardiac arrest at the time of the fistulagram. He has recovered from this. 04/17/16 a his left dorsal ankle appears to be improved. He has an appointment with [facility] to adjust his boot next week. He does not appear to have significant PAD but does have significant venous insufficiency Raybourn, Karman ([id number]) Objective Constitutional Sitting or standing Blood Pressure is within target range for patient.. Pulse regular and within target range for patient.Marland Kitchen Respirations regular, non-labored and within target range.. Patient's appearance is neat and clean. Appears in no acute distress. Well nourished and well developed.. Vitals Time Taken: 11:15 AM, Height: 75 in, Weight: 228 lbs, BMI: 28.5, Temperature: 98.1 F, Pulse: 87 bpm, Respiratory Rate: 18 breaths/min, Blood Pressure: 124/77 mmHg. Cardiovascular Heart rhythm and rate regular, without murmur or gallop.. Pedal pulses palpable and strong bilaterally.. Venous insufficiency and lymphedema in the left leg. Gastrointestinal (GI) Abdomen is soft and non-distended without masses or tenderness. Bowel sounds active in all quadrants.. No liver or spleen enlargement or tenderness.. General  Notes: Wound exam; the areas on the left ankle dorsally. He has a fixed inversion of the foot and ankle which is not different from last week. Chronic venous insufficiency probably some degree of lymphedema. The wound itself looks better there is maybe some mild surface slough that may require debridement, if this is not closed next week I will perform this then Integumentary (Hair, Skin) Wound #5 status is Open. Original cause  of wound was Gradually Appeared. The wound is located on the Left,Anterior Lower Leg. The wound measures 1.2cm length x 0.3cm width x 0.2cm depth; 0.283cm^2 area and 0.057cm^3 volume. The wound is limited to skin breakdown. There is no tunneling or undermining noted. There is a large amount of serous drainage noted. The wound margin is thickened. There is no granulation within the wound bed. There is a large (67-100%) amount of necrotic tissue within the wound bed including Eschar. The periwound skin appearance exhibited: Dry/Scaly. Periwound temperature was noted as No Abnormality. The periwound has tenderness on palpation. 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 breakdown of skin E11.42 - Type 2 diabetes mellitus with diabetic polyneuropathy M14.672 - Charcot's joint, left ankle and foot Venson, Curt ([id number]) Plan #1 we'll continue was silver [place] in foam based dressings at the patient is changing #2 has an appointment with [employer] next week to adjust the boot. He has also got some indentation laterally and medially in his upper leg. This led me to look more widely for a fluid overload state although I see no evidence of this Electronic Signature(s) Signed: 04/19/2016 7:59:30 AM By: Baltazar Najjar MD Entered By: Baltazar Najjar on 04/17/2016 11:38:27 [name] [name] ([id number]) -------------------------------------------------------------------------------- SuperBill Details Patient Name: [name] [name] Date of Service: 04/17/2016 Medical Record Patient Account Number: [id number] Number: Treating RN: Phillis Haggis 1956/10/11 (59 y.o. Other Clinician: Date of Birth/Sex: Male) Treating Shelsy Seng Primary Care Physician: [name], [name] Physician/Extender: G Referring Physician: FITZGERALD, [name] Weeks in Treatment: 1 Diagnosis Coding ICD-10 Codes Code Description L97.321 Non-pressure chronic ulcer of left ankle limited to breakdown of skin E11.42 Type 2 diabetes mellitus with diabetic polyneuropathy M14.672 Charcot's joint, left ankle and foot Facility Procedures CPT4 Code: [id number] Description: 99213 - WOUND CARE VISIT-LEV 3 EST PT Modifier: Quantity: 1 Physician Procedures CPT4 Code Description: [id number] 99213 - WC PHYS LEVEL 3 - EST PT ICD-10 Description Diagnosis L97.321 Non-pressure chronic ulcer of left ankle limited t Modifier: o breakdown of Quantity: 1 skin Electronic Signature(s) Signed: 04/17/2016 5:04:44 PM By: Alejandro Mulling Signed: 04/19/2016 7:59:30 AM By: Baltazar Najjar MD Entered By: Alejandro Mulling on 04/17/2016 11:56:00

## 2016-04-19 NOTE — Progress Notes (Signed)
LISLE, SKILLMAN ([id number]) Visit Report for 04/17/2016 Arrival Information Details Patient Name: [name] [name], [name] [name] Date of Service: 04/17/2016 10:45 AM Medical Record Number: [id number] Patient Account Number: [id number] Date of Birth/Sex: [birth year] (60 y.o. Male) Treating RN: Phillis Haggis Primary Care Physician: FITZGERALD, [name] Other Clinician: Referring Physician: FITZGERALD, [name] Treating Physician/Extender: Altamese [place] in Treatment: 1 Visit Information History Since Last Visit All ordered tests and consults were completed: No Patient Arrived: Wheel Chair Added or deleted any medications: No Arrival Time: 11:14 Any new allergies or adverse reactions: No Accompanied By: self Had a fall or experienced change in No Transfer Assistance: EasyPivot activities of daily living that may affect Patient Lift risk of falls: Patient Identification Verified: Yes Signs or symptoms of abuse/neglect since last No Secondary Verification Process Yes visito Completed: Hospitalized since last visit: No Patient Requires Transmission- No Pain Present Now: No Based Precautions: Patient Has Alerts: Yes Patient Alerts: DM II Dialysis Pt ABI L non- compress Electronic Signature(s) Signed: 04/17/2016 5:04:44 PM By: Alejandro Mulling Entered By: Alejandro Mulling on 04/17/2016 11:15:22 [name] [name] ([id number]) -------------------------------------------------------------------------------- Clinic Level of Care Assessment Details Patient Name: [name] [name] Date of Service: 04/17/2016 10:45 AM Medical Record Number: [id number] Patient Account Number: [id number] Date of Birth/Sex: July 04, 1956 (60 y.o. Male) Treating RN: Phillis Haggis Primary Care Physician: FITZGERALD, [name] Other Clinician: Referring Physician: FITZGERALD, [name] Treating Physician/Extender: Altamese  in Treatment: 1 Clinic Level of Care Assessment Items TOOL 4 Quantity Score X - Use when only an EandM is  performed on FOLLOW-UP visit 1 0 ASSESSMENTS - Nursing Assessment / Reassessment X - Reassessment of Co-morbidities (includes updates in patient status) 1 10 X - Reassessment of Adherence to Treatment Plan 1 5 ASSESSMENTS - Wound and Skin Assessment / Reassessment X - Simple Wound Assessment / Reassessment - one wound 1 5  - Complex Wound Assessment / Reassessment - multiple wounds 0  - Dermatologic / Skin Assessment (not related to wound area) 0 ASSESSMENTS - Focused Assessment X - Circumferential Edema Measurements - multi extremities 1 5  - Nutritional Assessment / Counseling / Intervention 0  - Lower Extremity Assessment (monofilament, tuning fork, pulses) 0  - Peripheral Arterial Disease Assessment (using hand held doppler) 0 ASSESSMENTS - Ostomy and/or Continence Assessment and Care  - Incontinence Assessment and Management 0  - Ostomy Care Assessment and Management (repouching, etc.) 0 PROCESS - Coordination of Care X - Simple Patient / Family Education for ongoing care 1 15  - Complex (extensive) Patient / Family Education for ongoing care 0 X - Staff obtains Chiropractor, Records, Test Results / Process Orders 1 10  - Staff telephones HHA, Nursing Homes / Clarify orders / etc 0  - Routine Transfer to another Facility (non-emergent condition) 0 Glas, Tilak ([id number])  - Routine Hospital Admission (non-emergent condition) 0  - New Admissions / Manufacturing engineer / Ordering NPWT, Apligraf, etc. 0  - Emergency Hospital Admission (emergent condition) 0 X - Simple Discharge Coordination 1 10  - Complex (extensive) Discharge Coordination 0 PROCESS - Special Needs  - Pediatric / Minor Patient Management 0  - Isolation Patient Management 0  - Hearing / Language / Visual special needs 0  - Assessment of Community assistance (transportation, D/C planning, etc.) 0  - Additional assistance / Altered mentation 0  - Support Surface(s) Assessment  (bed, cushion, seat, etc.) 0 INTERVENTIONS - Wound Cleansing / Measurement X - Simple Wound Cleansing - one wound 1 5  - Complex Wound Cleansing - multiple wounds 0  X - Wound Imaging (photographs - any number of wounds) 1 5 []  - Wound Tracing (instead of photographs) 0 X - Simple Wound Measurement - one wound 1 5 []  - Complex Wound Measurement - multiple wounds 0 INTERVENTIONS - Wound Dressings []  - Small Wound Dressing one or multiple wounds 0 X - Medium Wound Dressing one or multiple wounds 1 15 []  - Large Wound Dressing one or multiple wounds 0 X - Application of Medications - topical 1 5 []  - Application of Medications - injection 0 INTERVENTIONS - Miscellaneous []  - External ear exam 0 Thunder, Timmothy ([id number]) []  - Specimen Collection (cultures, biopsies, blood, body fluids, etc.) 0 []  - Specimen(s) / Culture(s) sent or taken to Lab for analysis 0 []  - Patient Transfer (multiple staff / Michiel SitesHoyer Lift / Similar devices) 0 []  - Simple Staple / Suture removal (25 or less) 0 []  - Complex Staple / Suture removal (26 or more) 0 []  - Hypo / Hyperglycemic Management (close monitor of Blood Glucose) 0 []  - Ankle / Brachial Index (ABI) - do not check if billed separately 0 X - Vital Signs 1 5 Has the patient been seen at the hospital within the last three years: Yes Total Score: 100 Level Of Care: New/Established - Level 3 Electronic Signature(s) Signed: 04/17/2016 5:04:44 PM By: Alejandro MullingPinkerton, Debra Entered By: Alejandro MullingPinkerton, Debra on 04/17/2016 11:55:44 [name] [name], [name] [name] ([id number]) -------------------------------------------------------------------------------- Encounter Discharge Information Details Patient Name: [name] [name], [name] [name] Date of Service: 04/17/2016 10:45 AM Medical Record Number: [id number] Patient Account Number: [id number] Date of Birth/Sex: 09/22/1956 [age] y.o. Male) Treating RN: Phillis HaggisPinkerton, Debi Primary Care Physician: FITZGERALD, [name] Other Clinician: Referring Physician: FITZGERALD,  [name] Treating Physician/Extender: Altamese CarolinaOBSON, MICHAEL G Weeks in Treatment: 1 Encounter Discharge Information Items Schedule Follow-up Appointment: No Medication Reconciliation completed No and provided to Patient/Care Torrey Horseman: Provided on Clinical Summary of Care: 04/17/2016 Form Type Recipient Paper Patient TA Electronic Signature(s) Signed: 04/17/2016 11:40:50 AM By: Gwenlyn PerkingMoore, Shelia Entered By: Gwenlyn PerkingMoore, Shelia on 04/17/2016 11:40:49 [name] [name], [name] [name] ([id number]) -------------------------------------------------------------------------------- Lower Extremity Assessment Details Patient Name: [name] [name], [name] [name] Date of Service: 04/17/2016 10:45 AM Medical Record Number: [id number] Patient Account Number: [id number] Date of Birth/Sex: 10/22/1956 [age] y.o. Male) Treating RN: Phillis HaggisPinkerton, Debi Primary Care Physician: FITZGERALD, [name] Other Clinician: Referring Physician: FITZGERALD, [name] Treating Physician/Extender: Maxwell CaulOBSON, MICHAEL G Weeks in Treatment: 1 Edema Assessment Assessed: [Left: No] [Right: No] E[Left: dema] [Right: :] Calf Left: Right: Point of Measurement: cm From Medial Instep 42.6 cm cm Ankle Left: Right: Point of Measurement: cm From Medial Instep 28.8 cm cm Vascular Assessment Pulses: Posterior Tibial Dorsalis Pedis Palpable: [Left:Yes] Extremity colors, hair growth, and conditions: Extremity Color: [Left:Hyperpigmented] Temperature of Extremity: [Left:Warm] Capillary Refill: [Left:< 3 seconds] Toe Nail Assessment Left: Right: Thick: Yes Discolored: Yes Deformed: Yes Improper Length and Hygiene: No Electronic Signature(s) Signed: 04/17/2016 5:04:44 PM By: Alejandro MullingPinkerton, Debra Entered By: Alejandro MullingPinkerton, Debra on 04/17/2016 11:20:44 [name] [name], [name] [name] ([id number]) -------------------------------------------------------------------------------- Multi Wound Chart Details Patient Name: [name] [name], [name] [name] Date of Service: 04/17/2016 10:45 AM Medical Record Number: [id number] Patient Account  Number: [id number] Date of Birth/Sex: 01/27/1956 [age] y.o. Male) Treating RN: Phillis HaggisPinkerton, Debi Primary Care Physician: FITZGERALD, [name] Other Clinician: Referring Physician: FITZGERALD, [name] Treating Physician/Extender: Maxwell CaulOBSON, MICHAEL G Weeks in Treatment: 1 Vital Signs Height(in): 75 Pulse(bpm): 87 Weight(lbs): 228 Blood Pressure 124/77 (mmHg): Body Mass Index(BMI): 28 Temperature(F): 98.1 Respiratory Rate 18 (breaths/min): Photos: [5:No Photos] [N/A:N/A] Wound Location: [5:Left Lower Leg - Anterior N/A] Wounding Event: [5:Gradually Appeared] [N/A:N/A] Primary Etiology: [5:To be determined] [N/A:N/A] Comorbid History: [5:Cataracts, Anemia, Arrhythmia,  Congestive Heart Failure, Coronary Artery Disease, Hypertension, Myocardial Infarction, Type II Diabetes, End Stage Renal Disease, Osteoarthritis, Neuropathy] [N/A:N/A] Date Acquired: [5:04/03/2016] [N/A:N/A] Weeks of Treatment: [5:1] [N/A:N/A] Wound Status: [5:Open] [N/A:N/A] Measurements L x W x D 1.2x0.3x0.2 [N/A:N/A] (cm) Area (cm) : [5:0.283] [N/A:N/A] Volume (cm) : [5:0.057] [N/A:N/A] % Reduction in Area: [5:9.90%] [N/A:N/A] % Reduction in Volume: 9.50% [N/A:N/A] Classification: [5:Partial Thickness] [N/A:N/A] HBO Classification: [5:Grade 1] [N/A:N/A] Exudate Amount: [5:Large] [N/A:N/A] Exudate Type: [5:Serous] [N/A:N/A] Exudate Color: [5:amber] [N/A:N/A] Wound Margin: [5:Thickened] [N/A:N/A] Granulation Amount: [5:None Present (0%)] [N/A:N/A] Necrotic Amount: [5:Large (67-100%)] [N/A:N/A] Necrotic Tissue: Eschar N/A N/A Exposed Structures: Fascia: No N/A N/A Fat: No Tendon: No Muscle: No Joint: No Bone: No Limited to Skin Breakdown Epithelialization: None N/A N/A Periwound Skin Texture: No Abnormalities Noted N/A N/A Periwound Skin Dry/Scaly: Yes N/A N/A Moisture: Periwound Skin Color: No Abnormalities Noted N/A N/A Temperature: No Abnormality N/A N/A Tenderness on Yes N/A N/A Palpation: Wound  Preparation: Ulcer Cleansing: N/A N/A Rinsed/Irrigated with Saline Topical Anesthetic Applied: Other: lidocaine 4% Treatment Notes Electronic Signature(s) Signed: 04/17/2016 5:04:44 PM By: Alejandro Mulling Entered By: Alejandro Mulling on 04/17/2016 11:23:22 [name] [name] ([id number]) -------------------------------------------------------------------------------- Multi-Disciplinary Care Plan Details Patient Name: [name] [name] Date of Service: 04/17/2016 10:45 AM Medical Record Number: [id number] Patient Account Number: [id number] Date of Birth/Sex: 05-02-1956 (60 y.o. Male) Treating RN: Phillis Haggis Primary Care Physician: FITZGERALD, [name] Other Clinician: Referring Physician: FITZGERALD, [name] Treating Physician/Extender: Maxwell Caul Weeks in Treatment: 1 Active Inactive Electronic Signature(s) Signed: 04/17/2016 5:04:44 PM By: Alejandro Mulling Entered By: Alejandro Mulling on 04/17/2016 11:22:58 [name] [name] ([id number]) -------------------------------------------------------------------------------- Pain Assessment Details Patient Name: [name] [name] Date of Service: 04/17/2016 10:45 AM Medical Record Number: [id number] Patient Account Number: [id number] Date of Birth/Sex: 15-Aug-1956 (60 y.o. Male) Treating RN: Phillis Haggis Primary Care Physician: FITZGERALD, [name] Other Clinician: Referring Physician: FITZGERALD, [name] Treating Physician/Extender: Maxwell Caul Weeks in Treatment: 1 Active Problems Location of Pain Severity and Description of Pain Patient Has Paino No Site Locations Pain Management and Medication Current Pain Management: Electronic Signature(s) Signed: 04/17/2016 5:04:44 PM By: Alejandro Mulling Entered By: Alejandro Mulling on 04/17/2016 11:15:29 [name] [name] ([id number]) -------------------------------------------------------------------------------- Wound Assessment Details Patient Name: [name] [name] Date of Service: 04/17/2016 10:45  AM Medical Record Number: [id number] Patient Account Number: [id number] Date of Birth/Sex: 1956-06-30 (60 y.o. Male) Treating RN: Phillis Haggis Primary Care Physician: FITZGERALD, [name] Other Clinician: Referring Physician: FITZGERALD, [name] Treating Physician/Extender: Maxwell Caul Weeks in Treatment: 1 Wound Status Wound Number: 5 Primary To be determined Etiology: Wound Location: Left Lower Leg - Anterior Wound Open Wounding Event: Gradually Appeared Status: Date Acquired: 04/03/2016 Comorbid Cataracts, Anemia, Arrhythmia, Weeks Of Treatment: 1 History: Congestive Heart Failure, Coronary Clustered Wound: No Artery Disease, Hypertension, Myocardial Infarction, Type II Diabetes, End Stage Renal Disease, Osteoarthritis, Neuropathy Photos Photo Uploaded By: Alejandro Mulling on 04/17/2016 16:28:20 Wound Measurements Length: (cm) 1.2 Width: (cm) 0.3 Depth: (cm) 0.2 Area: (cm) 0.283 Volume: (cm) 0.057 % Reduction in Area: 9.9% % Reduction in Volume: 9.5% Epithelialization: None Tunneling: No Undermining: No Wound Description Classification: Partial Thickness Diabetic Severity [address]): Grade 1 Wound Margin: Thickened Exudate Amount: Large Exudate Type: Serous Exudate Color: amber Foul Odor After Cleansing: No Wound Bed [name] [name], [name] [name] ([id number]) Granulation Amount: None Present (0%) Exposed Structure Necrotic Amount: Large (67-100%) Fascia Exposed: No Necrotic Quality: Eschar Fat Layer Exposed: No Tendon Exposed: No Muscle Exposed: No Joint Exposed: No Bone Exposed: No Limited to Skin Breakdown Periwound Skin Texture Texture Color No Abnormalities Noted: No  No Abnormalities Noted: No Moisture Temperature / Pain No Abnormalities Noted: No Temperature: No Abnormality Dry / Scaly: Yes Tenderness on Palpation: Yes Wound Preparation Ulcer Cleansing: Rinsed/Irrigated with Saline Topical Anesthetic Applied: Other: lidocaine 4%, Electronic  Signature(s) Signed: 04/17/2016 5:04:44 PM By: Alejandro Mulling Entered By: Alejandro Mulling on 04/17/2016 11:22:27 [name] [name] ([id number]) -------------------------------------------------------------------------------- Vitals Details Patient Name: [name] [name] Date of Service: 04/17/2016 10:45 AM Medical Record Number: [id number] Patient Account Number: [id number] Date of Birth/Sex: 1956-09-23 (60 y.o. Male) Treating RN: Phillis Haggis Primary Care Physician: FITZGERALD, [name] Other Clinician: Referring Physician: FITZGERALD, [name] Treating Physician/Extender: Maxwell Caul Weeks in Treatment: 1 Vital Signs Time Taken: 11:15 Temperature (F): 98.1 Height (in): 75 Pulse (bpm): 87 Weight (lbs): 228 Respiratory Rate (breaths/min): 18 Body Mass Index (BMI): 28.5 Blood Pressure (mmHg): 124/77 Reference Range: 80 - 120 mg / dl Electronic Signature(s) Signed: 04/17/2016 5:04:44 PM By: Alejandro Mulling Entered By: Alejandro Mulling on 04/17/2016 11:17:01

## 2016-04-24 ENCOUNTER — Encounter: Payer: Medicare Other | Attending: Internal Medicine | Admitting: Internal Medicine

## 2016-04-24 DIAGNOSIS — I132 Hypertensive heart and chronic kidney disease with heart failure and with stage 5 chronic kidney disease, or end stage renal disease: Secondary | ICD-10-CM | POA: Insufficient documentation

## 2016-04-24 DIAGNOSIS — L97321 Non-pressure chronic ulcer of left ankle limited to breakdown of skin: Secondary | ICD-10-CM | POA: Diagnosis present

## 2016-04-24 DIAGNOSIS — M14672 Charcot's joint, left ankle and foot: Secondary | ICD-10-CM | POA: Insufficient documentation

## 2016-04-24 DIAGNOSIS — I509 Heart failure, unspecified: Secondary | ICD-10-CM | POA: Diagnosis not present

## 2016-04-24 DIAGNOSIS — I252 Old myocardial infarction: Secondary | ICD-10-CM | POA: Insufficient documentation

## 2016-04-24 DIAGNOSIS — M1990 Unspecified osteoarthritis, unspecified site: Secondary | ICD-10-CM | POA: Diagnosis not present

## 2016-04-24 DIAGNOSIS — D649 Anemia, unspecified: Secondary | ICD-10-CM | POA: Insufficient documentation

## 2016-04-24 DIAGNOSIS — N186 End stage renal disease: Secondary | ICD-10-CM | POA: Diagnosis not present

## 2016-04-24 DIAGNOSIS — Z794 Long term (current) use of insulin: Secondary | ICD-10-CM | POA: Diagnosis not present

## 2016-04-24 DIAGNOSIS — E1142 Type 2 diabetes mellitus with diabetic polyneuropathy: Secondary | ICD-10-CM | POA: Insufficient documentation

## 2016-04-25 NOTE — Progress Notes (Signed)
[name] [name], [name] [name] ([id number]) Visit Report for 04/24/2016 Arrival Information Details Patient Name: [name] [name], [name] [name] Date of Service: 04/24/2016 8:45 AM Medical Record Number: [id number] Patient Account Number: [id number] Date of Birth/Sex: 12-15-1956 (60 y.o. Male) Treating RN: Phillis Haggis Primary Care Physician: FITZGERALD, [name] Other Clinician: Referring Physician: FITZGERALD, [name] Treating Physician/Extender: Altamese [place] in Treatment: 2 Visit Information History Since Last Visit All ordered tests and consults were completed: No Patient Arrived: Wheel Chair Added or deleted any medications: No Arrival Time: 08:46 Any new allergies or adverse reactions: No Accompanied By: self Had a fall or experienced change in No Transfer Assistance: EasyPivot activities of daily living that may affect Patient Lift risk of falls: Patient Identification Verified: Yes Signs or symptoms of abuse/neglect since last No Secondary Verification Process Yes visito Completed: Hospitalized since last visit: No Patient Requires Transmission- No Pain Present Now: No Based Precautions: Patient Has Alerts: Yes Patient Alerts: DM II Dialysis Pt ABI L non- compress Electronic Signature(s) Signed: 04/24/2016 4:42:38 PM By: Alejandro Mulling Entered By: Alejandro Mulling on 04/24/2016 08:48:20 [name] [name] ([id number]) -------------------------------------------------------------------------------- Encounter Discharge Information Details Patient Name: [name] [name] Date of Service: 04/24/2016 8:45 AM Medical Record Number: [id number] Patient Account Number: [id number] Date of Birth/Sex: Oct 14, 1956 (60 y.o. Male) Treating RN: Phillis Haggis Primary Care Physician: FITZGERALD, [name] Other Clinician: Referring Physician: FITZGERALD, [name] Treating Physician/Extender: Altamese [place] in Treatment: 2 Encounter Discharge Information Items Discharge Pain Level: 0 Discharge Condition:  Stable Ambulatory Status: Wheelchair Discharge Destination: Home Transportation: Other Accompanied By: self Schedule Follow-up Appointment: Yes Medication Reconciliation completed Yes and provided to Patient/Care Venie Montesinos: Provided on Clinical Summary of Care: 04/24/2016 Form Type Recipient Paper Patient TA Electronic Signature(s) Signed: 04/24/2016 9:35:47 AM By: Gwenlyn Perking Entered By: Gwenlyn Perking on 04/24/2016 09:35:47 [name] [name] ([id number]) -------------------------------------------------------------------------------- Lower Extremity Assessment Details Patient Name: [name] [name] Date of Service: 04/24/2016 8:45 AM Medical Record Number: [id number] Patient Account Number: [id number] Date of Birth/Sex: 1956/11/19 (60 y.o. Male) Treating RN: Phillis Haggis Primary Care Physician: FITZGERALD, [name] Other Clinician: Referring Physician: FITZGERALD, [name] Treating Physician/Extender: Maxwell Caul Weeks in Treatment: 2 Edema Assessment Assessed: [Left: No] [Right: No] E[Left: dema] [Right: :] Calf Left: Right: Point of Measurement: cm From Medial Instep 42.4 cm cm Ankle Left: Right: Point of Measurement: cm From Medial Instep 28.6 cm cm Vascular Assessment Pulses: Posterior Tibial Dorsalis Pedis Palpable: [Left:Yes] Extremity colors, hair growth, and conditions: Extremity Color: [Left:Hyperpigmented] Temperature of Extremity: [Left:Warm] Capillary Refill: [Left:< 3 seconds] Toe Nail Assessment Left: Right: Thick: Yes Discolored: Yes Deformed: Yes Improper Length and Hygiene: No Electronic Signature(s) Signed: 04/24/2016 4:42:38 PM By: Alejandro Mulling Entered By: Alejandro Mulling on 04/24/2016 08:51:02 [name] [name] ([id number]) -------------------------------------------------------------------------------- Multi Wound Chart Details Patient Name: [name] [name] Date of Service: 04/24/2016 8:45 AM Medical Record Number: [id number] Patient Account Number:  [id number] Date of Birth/Sex: 04/23/1956 (60 y.o. Male) Treating RN: Phillis Haggis Primary Care Physician: FITZGERALD, [name] Other Clinician: Referring Physician: FITZGERALD, [name] Treating Physician/Extender: Maxwell Caul Weeks in Treatment: 2 Vital Signs Height(in): 75 Pulse(bpm): 82 Weight(lbs): 228 Blood Pressure 118/73 (mmHg): Body Mass Index(BMI): 28 Temperature(F): 97.7 Respiratory Rate 18 (breaths/min): Photos: [5:No Photos] [N/A:N/A] Wound Location: [5:Left Lower Leg - Anterior N/A] Wounding Event: [5:Gradually Appeared] [N/A:N/A] Primary Etiology: [5:Trauma, Other] [N/A:N/A] Comorbid History: [5:Cataracts, Anemia, Arrhythmia, Congestive Heart Failure, Coronary Artery Disease, Hypertension, Myocardial Infarction, Type II Diabetes, End Stage Renal Disease, Osteoarthritis, Neuropathy] [N/A:N/A] Date Acquired: [5:04/03/2016] [N/A:N/A] Weeks of Treatment: [5:2] [N/A:N/A] Wound Status: [5:Open] [N/A:N/A] Measurements L x W  x D 1.5x0.4x0.1 [N/A:N/A] (cm) Area (cm) : [5:0.471] [N/A:N/A] Volume (cm) : [5:0.047] [N/A:N/A] % Reduction in Area: [5:-50.00%] [N/A:N/A] % Reduction in Volume: 25.40% [N/A:N/A] Classification: [5:Partial Thickness] [N/A:N/A] HBO Classification: [5:Grade 1] [N/A:N/A] Exudate Amount: [5:Large] [N/A:N/A] Exudate Type: [5:Serous] [N/A:N/A] Exudate Color: [5:amber] [N/A:N/A] Wound Margin: [5:Thickened] [N/A:N/A] Granulation Amount: [5:Medium (34-66%)] [N/A:N/A] Necrotic Amount: [5:Medium (34-66%)] [N/A:N/A] Necrotic Tissue: Eschar N/A N/A Exposed Structures: Fascia: No N/A N/A Fat: No Tendon: No Muscle: No Joint: No Bone: No Limited to Skin Breakdown Epithelialization: None N/A N/A Periwound Skin Texture: No Abnormalities Noted N/A N/A Periwound Skin Dry/Scaly: Yes N/A N/A Moisture: Periwound Skin Color: No Abnormalities Noted N/A N/A Temperature: No Abnormality N/A N/A Tenderness on Yes N/A N/A Palpation: Wound  Preparation: Ulcer Cleansing: N/A N/A Rinsed/Irrigated with Saline Topical Anesthetic Applied: Other: lidocaine 4% Treatment Notes Electronic Signature(s) Signed: 04/24/2016 4:42:38 PM By: Alejandro Mulling Entered By: Alejandro Mulling on 04/24/2016 08:55:09 [name] [name] ([id number]) -------------------------------------------------------------------------------- Multi-Disciplinary Care Plan Details Patient Name: [name] [name] Date of Service: 04/24/2016 8:45 AM Medical Record Number: [id number] Patient Account Number: [id number] Date of Birth/Sex: February 11, 1956 (60 y.o. Male) Treating RN: Phillis Haggis Primary Care Physician: FITZGERALD, [name] Other Clinician: Referring Physician: FITZGERALD, [name] Treating Physician/Extender: Altamese [place] in Treatment: 2 Active Inactive Abuse / Safety / Falls / Self Care Management Nursing Diagnoses: Potential for falls Goals: Patient will remain injury free Date Initiated: 04/24/2016 Goal Status: Active Interventions: Assess fall risk on admission and as needed Notes: Nutrition Nursing Diagnoses: Imbalanced nutrition Goals: Patient/caregiver agrees to and verbalizes understanding of need to use nutritional supplements and/or vitamins as prescribed Date Initiated: 04/24/2016 Goal Status: Active Interventions: Assess patient nutrition upon admission and as needed per policy Notes: Orientation to the Wound Care Program Nursing Diagnoses: Knowledge deficit related to the wound healing center program Goals: Patient/caregiver will verbalize understanding of the Wound Healing Center Program BLAYN, WHETSELL ([id number]) Date Initiated: 04/24/2016 Goal Status: Active Interventions: Provide education on orientation to the wound center Notes: Wound/Skin Impairment Nursing Diagnoses: Impaired tissue integrity Goals: Ulcer/skin breakdown will have a volume reduction of 30% by week 4 Date Initiated: 04/24/2016 Goal Status: Active Ulcer/skin  breakdown will have a volume reduction of 50% by week 8 Date Initiated: 04/24/2016 Goal Status: Active Ulcer/skin breakdown will have a volume reduction of 80% by week 12 Date Initiated: 04/24/2016 Goal Status: Active Interventions: Assess patient/caregiver ability to obtain necessary supplies Assess ulceration(s) every visit Notes: Electronic Signature(s) Signed: 04/24/2016 4:42:38 PM By: Alejandro Mulling Entered By: Alejandro Mulling on 04/24/2016 08:58:24 [name] [name] ([id number]) -------------------------------------------------------------------------------- Pain Assessment Details Patient Name: [name] [name] Date of Service: 04/24/2016 8:45 AM Medical Record Number: [id number] Patient Account Number: [id number] Date of Birth/Sex: Nov 17, 1956 (60 y.o. Male) Treating RN: Phillis Haggis Primary Care Physician: FITZGERALD, [name] Other Clinician: Referring Physician: FITZGERALD, [name] Treating Physician/Extender: Maxwell Caul Weeks in Treatment: 2 Active Problems Location of Pain Severity and Description of Pain Patient Has Paino No Site Locations Pain Management and Medication Current Pain Management: Electronic Signature(s) Signed: 04/24/2016 4:42:38 PM By: Alejandro Mulling Entered By: Alejandro Mulling on 04/24/2016 08:48:25 [name] [name] ([id number]) -------------------------------------------------------------------------------- Patient/Caregiver Education Details Patient Name: [name] [name] Date of Service: 04/24/2016 8:45 AM Medical Record Number: [id number] Patient Account Number: [id number] Date of Birth/Gender: 02-26-1956 (60 y.o. Male) Treating RN: Phillis Haggis Primary Care Physician: FITZGERALD, [name] Other Clinician: Referring Physician: FITZGERALD, [name] Treating Physician/Extender: Altamese  in Treatment: 2 Education Assessment Education Provided To: Patient Education Topics Provided Wound/Skin Impairment: Handouts: Other: change dressing as  ordered Methods: Demonstration, Explain/Verbal Responses: State content correctly Electronic Signature(s) Signed: 04/24/2016 4:42:38 PM By: Alejandro MullingPinkerton, Debra Entered By: Alejandro MullingPinkerton, Debra on 04/24/2016 08:58:58 [name] [name], Elaine ([id number]) -------------------------------------------------------------------------------- Wound Assessment Details Patient Name: [name] [name], [name] [name] Date of Service: 04/24/2016 8:45 AM Medical Record Number: [id number] Patient Account Number: [id number] Date of Birth/Sex: 11/23/1956 [age] y.o. Male) Treating RN: Phillis HaggisPinkerton, Debi Primary Care Physician: FITZGERALD, [name] Other Clinician: Referring Physician: FITZGERALD, [name] Treating Physician/Extender: Maxwell CaulOBSON, MICHAEL G Weeks in Treatment: 2 Wound Status Wound Number: 5 Primary Trauma, Other Etiology: Wound Location: Left Lower Leg - Anterior Wound Open Wounding Event: Gradually Appeared Status: Date Acquired: 04/03/2016 Comorbid Cataracts, Anemia, Arrhythmia, Weeks Of Treatment: 2 History: Congestive Heart Failure, Coronary Clustered Wound: No Artery Disease, Hypertension, Myocardial Infarction, Type II Diabetes, End Stage Renal Disease, Osteoarthritis, Neuropathy Photos Photo Uploaded By: Alejandro MullingPinkerton, Debra on 04/24/2016 16:25:54 Wound Measurements Length: (cm) 1.5 Width: (cm) 0.4 Depth: (cm) 0.1 Area: (cm) 0.471 Volume: (cm) 0.047 % Reduction in Area: -50% % Reduction in Volume: 25.4% Epithelialization: None Tunneling: No Undermining: No Wound Description Classification: Partial Thickness Diabetic Severity [address](Wagner): Grade 1 Wound Margin: Thickened Exudate Amount: Large Exudate Type: Serous Exudate Color: amber Foul Odor After Cleansing: No Wound Bed Springer, [name] [name] ([id number]) Granulation Amount: Medium (34-66%) Exposed Structure Necrotic Amount: Medium (34-66%) Fascia Exposed: No Necrotic Quality: Eschar Fat Layer Exposed: No Tendon Exposed: No Muscle Exposed: No Joint Exposed: No Bone  Exposed: No Limited to Skin Breakdown Periwound Skin Texture Texture Color No Abnormalities Noted: No No Abnormalities Noted: No Moisture Temperature / Pain No Abnormalities Noted: No Temperature: No Abnormality Dry / Scaly: Yes Tenderness on Palpation: Yes Wound Preparation Ulcer Cleansing: Rinsed/Irrigated with Saline Topical Anesthetic Applied: Other: lidocaine 4%, Treatment Notes Wound #5 (Left, Anterior Lower Leg) 1. Cleansed with: Clean wound with Normal Saline 2. Anesthetic Topical Lidocaine 4% cream to wound bed prior to debridement 3. Peri-wound Care: Barrier cream 4. Dressing Applied: Aquacel Ag 5. Secondary Dressing Applied Dry Gauze Foam 7. Secured with Tape 2 Layer Lite Compression System - Left Lower Extremity Notes anchored with unna Electronic Signature(s) Signed: 04/24/2016 4:42:38 PM By: Alejandro MullingPinkerton, Debra Entered By: Alejandro MullingPinkerton, Debra on 04/24/2016 08:54:31 [name] [name], [name] [name] ([id number]) -------------------------------------------------------------------------------- Vitals Details Patient Name: [name] [name], [name] [name] Date of Service: 04/24/2016 8:45 AM Medical Record Number: [id number] Patient Account Number: [id number] Date of Birth/Sex: 04/10/1956 [age] y.o. Male) Treating RN: Phillis HaggisPinkerton, Debi Primary Care Physician: FITZGERALD, [name] Other Clinician: Referring Physician: FITZGERALD, [name] Treating Physician/Extender: Maxwell CaulOBSON, MICHAEL G Weeks in Treatment: 2 Vital Signs Time Taken: 08:48 Temperature (F): 97.7 Height (in): 75 Pulse (bpm): 82 Weight (lbs): 228 Respiratory Rate (breaths/min): 18 Body Mass Index (BMI): 28.5 Blood Pressure (mmHg): 118/73 Reference Range: 80 - 120 mg / dl Electronic Signature(s) Signed: 04/24/2016 4:42:38 PM By: Alejandro MullingPinkerton, Debra Entered By: Alejandro MullingPinkerton, Debra on 04/24/2016 08:49:57

## 2016-04-25 NOTE — Progress Notes (Signed)
[name] [name], [name] [name] ([id number]) Visit Report for 04/24/2016 Chief Complaint Document Details Patient Name: KEYLER, HOGE Date of Service: 04/24/2016 8:45 AM Medical Record Patient Account Number: [id number] Number: Treating RN: Phillis Haggis June 08, 1956 (60 y.o. Other Clinician: Date of Birth/Sex: Male) Treating ROBSON, MICHAEL Primary Care Physician: [name], [name] Physician/Extender: G Referring Physician: FITZGERALD, [name] Weeks in Treatment: 2 Information Obtained from: Patient Chief Complaint Right second toe wound and left lateral malleolar wound. is doing fine and has no fresh issues. He does have his appointment with the vascular surgeons. However he does not have an appointment with the orthopedic surgeons yet. he did not come for 2 weeks because of the bad weather. He has no fresh issues. 04/10/16; the patient returns today with a wound over the last 2-3 weeks on the anterior part of his left ankle. Electronic Signature(s) Signed: 04/25/2016 8:00:31 AM By: Baltazar Najjar MD Entered By: Baltazar Najjar on 04/24/2016 09:18:26 [name] ([id number]) -------------------------------------------------------------------------------- Debridement Details Patient Name: [name] Date of Service: 04/24/2016 8:45 AM Medical Record Patient Account Number: [id number] Number: Treating RN: Phillis Haggis [birth year] (60 y.o. Other Clinician: Date of Birth/Sex: Male) Treating ROBSON, MICHAEL Primary Care Physician: [name], [name] Physician/Extender: G Referring Physician: FITZGERALD, [name] Weeks in Treatment: 2 Debridement Performed for Wound #5 Left,Anterior Lower Leg Assessment: Performed By: Physician Maxwell Caul, MD Debridement: Debridement Pre-procedure Yes Verification/Time Out Taken: Start Time: 09:12 Pain Control: Lidocaine 4% Topical Solution Level: Skin/Subcutaneous Tissue Total Area Debrided (L x 1.5 (cm) x 0.4 (cm) = 0.6 (cm) W): Tissue  and other Viable, Non-Viable, Exudate, Fibrin/Slough, Subcutaneous material debrided: Instrument: Curette Bleeding: Moderate Hemostasis Achieved: Silver Nitrate End Time: 09:14 Procedural Pain: 0 Post Procedural Pain: 0 Response to Treatment: Procedure was tolerated well Post Debridement Measurements of Total Wound Length: (cm) 1.5 Width: (cm) 0.4 Depth: (cm) 0.1 Volume: (cm) 0.047 Post Procedure Diagnosis Same as Pre-procedure Electronic Signature(s) Signed: 04/24/2016 4:42:38 PM By: Alejandro Mulling Signed: 04/25/2016 8:00:31 AM By: Baltazar Najjar MD Entered By: Baltazar Najjar on 04/24/2016 09:18:12 [name] ([id number]) -------------------------------------------------------------------------------- HPI Details Patient Name: [name] Date of Service: 04/24/2016 8:45 AM Medical Record Patient Account Number: [id number] Number: Treating RN: Phillis Haggis 11-24-1956 (60 y.o. Other Clinician: Date of Birth/Sex: Male) Treating ROBSON, MICHAEL Primary Care Physician: [name], [name] Physician/Extender: G Referring Physician: FITZGERALD, [name] Weeks in Treatment: 2 History of Present Illness Location: left fourth toe Quality: Patient reports experiencing a dull pain to affected area(s). Severity: Patient states wound (s) are getting better. Duration: Patient states that they are not certain how long the wound has been present. Timing: Pain in wound is constant (hurts all the time) Context: The wound appeared gradually over time Associated Signs and Symptoms: nil HPI Description: has been doing fine and has no fresh issues. He does say that he is trying to get all his appointments in order and at present he has a appointment with the vascular surgeons. While at home he removes his walking shoe and keeps the leg and foot open. There is no pain and minimal drainage. 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 He tells me that over the last 2-3 weeks he has noticed pain and a wound on the  crease of his left dorsal ankle and he is here for our review events. Because of the deformity in his ankle he has a fracture boot that he uses the walker. [facility] [place] he was in hospital in January and suffered a wide complex cardiac arrest at the time of the fistulagram. He has recovered from this. 04/17/16 a his left dorsal ankle appears to be improved. He has an appointment with [facility] to adjust his boot next week. He does not appear to have significant PAD but does have significant venous insufficiencywent 04/24/16; went to [facility] last week,did not adjust his boot due to edema. She has called his cardiologist and in the meantime they have apparently taken some extra fluid off him at dialysis. He dialyzes Monday Wednesday and Friday Electronic Signature(s) Signed: 04/25/2016 8:00:31 AM By: Baltazar Najjarobson, Michael MD Entered By: Baltazar Najjarobson, Michael on 04/24/2016 09:21:20 Rhetta MuraABNEY, Seaton ([id number]) -------------------------------------------------------------------------------- Physical Exam Details Patient Name: Rhetta MuraABNEY, Kyson Date of Service: 04/24/2016 8:45 AM Medical Record Patient Account Number: [id number] Number: Treating RN: Phillis Haggisinkerton, Debi 07/24/1956 (60 y.o. Other Clinician: Date of Birth/Sex: Male) Treating ROBSON, MICHAEL Primary Care Physician: Sampson GoonFITZGERALD, [name] Physician/Extender: G Referring Physician: FITZGERALD, [name] Weeks in Treatment: 2 Constitutional Sitting or standing Blood Pressure is within target range for patient.. Pulse regular and within target range for patient.Marland Kitchen. Respirations regular, non-labored and  within target range.. Temperature is normal and within the target range for the patient.Marland Kitchen. Respiratory Respiratory effort is easy and symmetric bilaterally. Rate is normal at rest and on room air.. Shallow but otherwise clear. Cardiovascular Heart rhythm and rate regular, without murmur or gallop. The venous pressure is not elevated. Pedal pulses absent bilaterally.. Edema present in both extremities. Left greater than right. There is evidence of chronic venous insufficiency in the left leg. Notes Wound exam; wounds on the dorsal aspect of his ankle of. The ankle itself is inverted which is the reason for the cast boot in the first place. His leg has significant chronic venous insufficiency and does have some tense edema below the knee greater than the right side. There is certainly no clinical evidence that I can see of a DVT or cellulitis no warmth and no tenderness Electronic Signature(s) Signed: 04/25/2016 8:00:31 AM By: Baltazar Najjarobson, Michael MD Entered By: Baltazar Najjarobson, Michael on 04/24/2016 09:23:20 Rhetta MuraABNEY, Zarian ([id number]) -------------------------------------------------------------------------------- Physician Orders Details Patient Name: Rhetta MuraABNEY, Gustaf Date of Service: 04/24/2016 8:45 AM Medical Record Patient Account Number: [id number] Number: Treating RN: Phillis Haggisinkerton, Debi 12/10/1956 (59 y.o. Other Clinician: Date of Birth/Sex: Male) Treating ROBSON, MICHAEL Primary Care Physician: Sampson GoonFITZGERALD, [name] Physician/Extender: G Referring Physician: FITZGERALD, [name] Weeks in Treatment: 2 Verbal / Phone Orders: Yes Clinician: Pinkerton, Debi Read Back and Verified: Yes Diagnosis Coding Wound Cleansing Wound #5 Left,Anterior Lower Leg o Clean wound with Normal Saline. o Cleanse wound with mild soap and water Anesthetic Wound #5 Left,Anterior Lower Leg o Topical Lidocaine 4% cream applied to wound bed prior to debridement Skin Barriers/Peri-Wound Care Wound #5 Left,Anterior  Lower Leg o Barrier cream Primary Wound Dressing Wound #5 Left,Anterior Lower Leg o Aquacel Ag Secondary Dressing Wound #5 Left,Anterior Lower Leg o Dry Gauze o Foam Dressing Change Frequency Wound #5 Left,Anterior Lower Leg o Change dressing every week Follow-up Appointments Wound #5 Left,Anterior Lower Leg o Return Appointment in 1 week. Edema Control Wound #5 Left,Anterior Lower Leg o 2 Layer Lite Compression System - Left Lower Extremity - anchor with Charlyn Minervaunna Stabenow, Olney ([id number]) o Elevate  legs to the level of the heart and pump ankles as often as possible Off-Loading o Turn and reposition every 2 hours - Stay off foot and try to keep boot off as long as possible. Electronic Signature(s) Signed: 04/24/2016 4:42:38 PM By: Alejandro Mulling Signed: 04/25/2016 8:00:31 AM By: Baltazar Najjar MD Entered By: Alejandro Mulling on 04/24/2016 09:30:29 [name] ([id number]) -------------------------------------------------------------------------------- Problem List Details Patient Name: [name] Date of Service: 04/24/2016 8:45 AM Medical Record Patient Account Number: [id number] Number: Treating RN: Phillis Haggis 28-Mar-1956 (59 y.o. 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/10/2016 Yes breakdown of skin E11.42 Type 2 diabetes mellitus with diabetic polyneuropathy 04/10/2016 Yes M14.672 Charcot's joint, left ankle and foot 04/10/2016 Yes Inactive Problems Resolved Problems Electronic Signature(s) Signed: 04/25/2016 8:00:31 AM By: Baltazar Najjar MD Entered By: Baltazar Najjar on 04/24/2016 09:17:44 [name]  ([id number]) -------------------------------------------------------------------------------- Progress Note Details Patient Name: [name] Date of Service: 04/24/2016 8:45 AM Medical Record Patient Account Number: [id number] Number: Treating RN: Phillis Haggis 1956-12-02 (59 y.o. Other Clinician: Date of Birth/Sex: Male) Treating ROBSON, MICHAEL Primary Care Physician: [name], [name] Physician/Extender: G Referring Physician: FITZGERALD, [name] Weeks in Treatment: 2 Subjective Chief Complaint Information obtained from Patient Right second toe wound and left lateral malleolar wound. is doing fine and has no fresh issues. He does have his appointment with the vascular surgeons. However he does not have an appointment with the orthopedic surgeons yet. he did not come for 2 weeks because of the bad weather. He has no fresh issues. 04/10/16; the patient returns today with a wound over the last 2-3 weeks on the anterior part of his left ankle. History of Present Illness (HPI) The following HPI elements were documented for the patient's wound: Location: left fourth toe Quality: Patient reports experiencing a dull pain to affected area(s). Severity: Patient states wound (s) are getting better. Duration: Patient states that they are not certain how long the wound has been present. Timing: Pain in wound is constant (hurts all the time) Context: The wound appeared gradually over time Associated Signs and Symptoms: nil has been doing fine and has no fresh issues. He does say that he is trying to get all his appointments in order and at present he has a appointment with the vascular surgeons. While at home he removes his walking shoe and keeps the leg and foot open. There is no pain and minimal drainage. 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 He also has chronic deformity of the left ankle with  chronic inversion she states was from an ankle fracture in 2014 that he continued to walk on due to a need to continue to work. He tells me that over the last 2-3 weeks he has noticed pain and a wound on the crease of his left dorsal ankle and he is here for our review events. Because of the deformity in his ankle he has a fracture boot that he uses the walker. This was [facility] at [facility]. [facility] he was in hospital in January and suffered a wide complex cardiac arrest at the time of the fistulagram. He has recovered from this. 04/17/16 a his left dorsal ankle  appears to be improved. He has an appointment with [facility] to adjust his boot next week. He does not appear to have significant PAD but does have significant venous insufficiencywent 04/24/16; went to [facility] last week,did not adjust his boot due to edema. She has called his cardiologist and in the meantime they have apparently taken some extra fluid off him at dialysis. He dialyzes Monday Wednesday and Friday Ridgley, Skylur ([id number]) Objective Constitutional Sitting or standing Blood Pressure is within target range for patient.. Pulse regular and within target range for patient.Marland Kitchen Respirations regular, non-labored and within target range.. Temperature is normal and within the target range for the patient.. Vitals Time Taken: 8:48 AM, Height: 75 in, Weight: 228 lbs, BMI: 28.5, Temperature: 97.7 F, Pulse: 82 bpm, Respiratory Rate: 18 breaths/min, Blood Pressure: 118/73 mmHg. Respiratory Respiratory effort is easy and symmetric bilaterally. Rate is normal at rest and on room air.. Shallow but otherwise clear. Cardiovascular Heart rhythm and rate regular, without murmur or gallop. The venous pressure is not elevated. Pedal pulses absent bilaterally.. Edema present in both extremities. Left greater than right. There is evidence of chronic venous insufficiency in the left leg. General  Notes: Wound exam; wounds on the dorsal aspect of his ankle of. The ankle itself is inverted which is the reason for the cast boot in the first place. His leg has significant chronic venous insufficiency and does have some tense edema below the knee greater than the right side. There is certainly no clinical evidence that I can see of a DVT or cellulitis no warmth and no tenderness Integumentary (Hair, Skin) Wound #5 status is Open. Original cause of wound was Gradually Appeared. The wound is located on the Left,Anterior Lower Leg. The wound measures 1.5cm length x 0.4cm width x 0.1cm depth; 0.471cm^2 area and 0.047cm^3 volume. The wound is limited to skin breakdown. There is no tunneling or undermining noted. There is a large amount of serous drainage noted. The wound margin is thickened. There is medium (34-66%) granulation within the wound bed. There is a medium (34-66%) amount of necrotic tissue within the wound bed including Eschar. The periwound skin appearance exhibited: Dry/Scaly. Periwound temperature was noted as No Abnormality. The periwound has tenderness on palpation. 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 There was a Skin/Subcutaneous Tissue Debridement ([id number]) debridement with total area of 0.6 sq cm performed by Maxwell Caul, MD. with the following instrument(s): Curette to remove Viable and Non-Viable tissue/material including Exudate, Fibrin/Slough, and Subcutaneous after achieving pain control using Lidocaine 4% Topical Solution. A time out was conducted prior to the start of the procedure. A Moderate amount of bleeding was controlled with Silver Nitrate. The  procedure was tolerated well with a pain level of 0 throughout and a pain level of 0 following the procedure. Post Debridement Measurements: 1.5cm length x 0.4cm width x 0.1cm depth; 0.047cm^3 volume. Post procedure Diagnosis Wound #5: Same as Pre-Procedure Plan Wound Cleansing: Wound #5 Left,Anterior Lower Leg: Clean wound with Normal Saline. Cleanse wound with mild soap and water Anesthetic: Wound #5 Left,Anterior Lower Leg: Topical Lidocaine 4% cream applied to wound bed prior to debridement Skin Barriers/Peri-Wound Care: Wound #5 Left,Anterior Lower Leg: Barrier cream Primary Wound Dressing: Wound #5 Left,Anterior Lower  Leg: Aquacel Ag Secondary Dressing: Wound #5 Left,Anterior Lower Leg: Dry Gauze Foam Dressing Change Frequency: Wound #5 Left,Anterior Lower Leg: Change dressing every week Follow-up Appointments: Wound #5 Left,Anterior Lower Leg: Bleecker, Jaasiel ([id number]) Return Appointment in 1 week. Edema Control: Wound #5 Left,Anterior Lower Leg: 2 Layer Lite Compression System - Left Lower Extremity Elevate legs to the level of the heart and pump ankles as often as possible Off-Loading: Turn and reposition every 2 hours - Stay off foot and try to keep boot off as long as possible. #1 is wound is actually not bad. Surface debridement to remove surrounding eschar I really think this is going to close down. # 2 edema in the left leg. No signs of fluid overload either from archaeology were chronic renal failure. I think this is probably chronic venous insufficiency perhaps some degree of lymphedema. I see no evidence of DVT. I'm going to try to wrap the left leg Kerlix Coban. I'll see if I can find arterial studies as he is been to see vein and vascular. His vessels were noncompressible here Electronic Signature(s) Signed: 04/25/2016 8:00:31 AM By: Baltazar Najjar MD Entered By: Baltazar Najjar on 04/24/2016 09:25:11 [name]  ([id number]) -------------------------------------------------------------------------------- SuperBill Details Patient Name: [name] Date of Service: 04/24/2016 Medical Record Patient Account Number: [id number] Number: Treating RN: Phillis Haggis 1956/06/20 (59 y.o. 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

## 2016-05-01 ENCOUNTER — Encounter: Payer: Medicare Other | Admitting: Internal Medicine

## 2016-05-01 DIAGNOSIS — L97321 Non-pressure chronic ulcer of left ankle limited to breakdown of skin: Secondary | ICD-10-CM | POA: Diagnosis not present

## 2016-05-03 NOTE — Progress Notes (Signed)
[name] [name], Niraj ([id number]) Visit Report for 05/01/2016 Chief Complaint Document Details Patient Name: [name] [name], [name] [name] Date of Service: 05/01/2016 8:45 AM Medical Record Patient Account Number: [id number] Number: Treating RN: Phillis Haggisinkerton, Debi 04/06/1956 (59 y.o. Other Clinician: Date of Birth/Sex: Male) Treating ROBSON, MICHAEL Primary Care Physician: Sampson GoonFITZGERALD, [name] Physician/Extender: G Referring Physician: FITZGERALD, [name] Weeks in Treatment: 3 Information Obtained from: Patient Chief Complaint Right second toe wound and left lateral malleolar wound. is doing fine and has no fresh issues. He does have his appointment with the vascular surgeons. However he does not have an appointment with the orthopedic surgeons yet. he did not come for 2 weeks because of the bad weather. He has no fresh issues. 04/10/16; the patient returns today with a wound over the last 2-3 weeks on the anterior part of his left ankle. Electronic Signature(s) Signed: 05/02/2016 7:54:52 AM By: Baltazar Najjarobson, Michael MD Entered By: Baltazar Najjarobson, Michael on 05/01/2016 09:48:22 [name] [name], [name] [name] ([id number]) -------------------------------------------------------------------------------- HPI Details Patient Name: [name] [name], [name] [name] Date of Service: 05/01/2016 8:45 AM Medical Record Patient Account Number: [id number] Number: Treating RN: Phillis Haggisinkerton, Debi 04/17/1956 (59 y.o. Other Clinician: Date of Birth/Sex: Male) Treating ROBSON, MICHAEL Primary Care Physician: Sampson GoonFITZGERALD, [name] Physician/Extender: G Referring Physician: FITZGERALD, [name] Weeks in Treatment: 3 History of Present Illness Location: left fourth toe Quality: Patient reports experiencing a dull pain to affected area(s). Severity: Patient states wound (s) are getting better. Duration: Patient states that they are not certain how long the wound has been present. Timing: Pain in wound is constant (hurts all the time) Context: The wound appeared gradually over  time Associated Signs and Symptoms: nil HPI Description: has been doing fine and has no fresh issues. He does say that he is trying to get all his appointments in order and at present he has a appointment with the vascular surgeons. While at home he removes his walking shoe and keeps the leg and foot open. There is no pain and minimal drainage. 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 He tells me that over the last 2-3 weeks he has noticed pain and a wound on the crease of his left dorsal ankle and he is here for our review events. Because of the deformity in his ankle he has a fracture boot that he uses the walker. [facility] [place]. [facility] he was in hospital in January and suffered a wide complex cardiac arrest at the time of the fistulagram. He has recovered from this. 04/17/16 a his left dorsal ankle appears to be improved. He has an appointment with [employer] to adjust his boot next week. He does not appear to have significant PAD but does have significant venous insufficiencywent 04/24/16; went to [facility] last week,did not adjust his boot due to edema. She has called his cardiologist and in the meantime they have apparently taken some extra fluid off him at dialysis. He dialyzes Monday Wednesday and Friday 05/01/16; the patient arrived with some scant surface eschar over his dorsal ankle wound. This was debridement. There is nothing but normal skin under this. His leg seems to have done well with a 2 layer compression we put on this last week accompanied by  extra fluid removal at dialysis's resulted in less edema of his leg. He has chronic venous insufficiency with resultant  edema been eating his chronic renal failure and deformity of his left ankle. Electronic Signature(s) Signed: 05/02/2016 7:54:52 AM By: Baltazar Najjar MD Entered By: Baltazar Najjar on 05/01/2016 09:50:12 [name] [name] ([id number]) [name] [name], [name] [name] ([id number]) -------------------------------------------------------------------------------- Physical Exam Details Patient Name: [name] [name] Date of Service: 05/01/2016 8:45 AM Medical Record Patient Account Number: [id number] Number: Treating RN: Phillis Haggis 08/23/1956 (59 y.o. Other Clinician: Date of Birth/Sex: Male) Treating ROBSON, MICHAEL Primary Care Physician: [name], [name] Physician/Extender: G Referring Physician: FITZGERALD, [name] Weeks in Treatment: 3 Constitutional Sitting or standing Blood Pressure is within target range for patient.. Pulse regular and within target range for patient.Marland Kitchen Respirations regular, non-labored and within target range.. Temperature is normal and within the target range for the patient.. Eyes Conjunctivae have some redness. Mild discharge noted. No scleral icterus. Respiratory Respiratory effort is easy and symmetric bilaterally. Rate is normal at rest and on room air.. Cardiovascular Pedal pulses at the dorsalis pedis pulses are palpable. Edema present in both extremities. The patient has severe venous insufficiency with chronic stasis physiology. Lymphatic None palpable in the popliteal or inguinal area. Psychiatric No evidence of depression, anxiety, or agitation. Calm, cooperative, and communicative. Appropriate interactions and affect.. Notes Wound exam; wounds on the dorsal aspect of his ankle in the setting of an inverted left ankle. After a light surface debridement with a curet there is no open wound here. His edema is under much better control. He has not had vascular studies however his ABIs are both well within the normal range done at vascular surgery locally Electronic  Signature(s) Signed: 05/02/2016 7:54:52 AM By: Baltazar Najjar MD Entered By: Baltazar Najjar on 05/01/2016 09:52:34 [name] [name] ([id number]) -------------------------------------------------------------------------------- Physician Orders Details Patient Name: [name] [name] Date of Service: 05/01/2016 8:45 AM Medical Record Patient Account Number: [id number] Number: Treating RN: Phillis Haggis [birth year] (59 y.o. Other Clinician: Date of Birth/Sex: Male) Treating ROBSON, MICHAEL Primary Care Physician: [name], [name] Physician/Extender: G Referring Physician: FITZGERALD, [name] Weeks in Treatment: 3 Verbal / Phone Orders: Yes Clinician: Pinkerton, Debi Read Back and Verified: Yes Diagnosis Coding Wound Cleansing o Clean wound with Normal Saline. Primary Wound Dressing o Foam Secondary Dressing o Conform/Kerlix Dressing Change Frequency o Change dressing every other day. - Just keep new skin protected for 2 weeks. Discharge From [facility] Services o Discharge from Wound Care Center - Keep newly healed skin protected with foam for two weeks. Call us if you need anything or have any questions. Electronic Signature(s) Signed: 05/02/2016 7:54:52 AM By: Baltazar Najjar MD Signed: 05/02/2016 5:26:12 PM By: Alejandro Mulling Entered By: Alejandro Mulling on 05/01/2016 09:50:20 [name] [name] ([id number]) -------------------------------------------------------------------------------- Problem List Details Patient Name: [name] [name] Date of Service: 05/01/2016 8:45 AM Medical Record Patient Account Number: [id number] Number: Treating RN: Phillis Haggis July 23, 1956 (59 y.o. Other Clinician: Date of Birth/Sex: Male) Treating ROBSON, MICHAEL Primary Care Physician: [name], [name] Physician/Extender: G Referring Physician: FITZGERALD, [name] Weeks in Treatment: 3 Active Problems ICD-10 Encounter Code Description Active Date Diagnosis L97.321 Non-pressure  chronic ulcer of left ankle limited to 04/10/2016 Yes breakdown of skin E11.42 Type 2 diabetes mellitus with diabetic polyneuropathy 04/10/2016 Yes M14.672 Charcot's joint, left ankle and foot 04/10/2016 Yes Inactive Problems Resolved Problems Electronic Signature(s) Signed: 05/02/2016 7:54:52 AM By: Baltazar Najjar MD Entered By: Baltazar Najjar on 05/01/2016 09:48:09 [name] [name] ([id number]) -------------------------------------------------------------------------------- Progress Note Details Patient Name: [name] [name] Date of Service: 05/01/2016  8:45 AM Medical Record Patient Account Number: [id number] Number: Treating RN: Phillis Haggis [birth year] (59 y.o. Other Clinician: Date of Birth/Sex: Male) Treating ROBSON, MICHAEL Primary Care Physician: [name], [name] Physician/Extender: G Referring Physician: FITZGERALD, [name] Weeks in Treatment: 3 Subjective Chief Complaint Information obtained from Patient Right second toe wound and left lateral malleolar wound. is doing fine and has no fresh issues. He does have his appointment with the vascular surgeons. However he does not have an appointment with the orthopedic surgeons yet. he did not come for 2 weeks because of the bad weather. He has no fresh issues. 04/10/16; the patient returns today with a wound over the last 2-3 weeks on the anterior part of his left ankle. History of Present Illness (HPI) The following HPI elements were documented for the patient's wound: Location: left fourth toe Quality: Patient reports experiencing a dull pain to affected area(s). Severity: Patient states wound (s) are getting better. Duration: Patient states that they are not certain how long the wound has been present. Timing: Pain in wound is constant (hurts all the time) Context: The wound appeared gradually over time Associated Signs and Symptoms: nil has been doing fine and has no fresh issues. He does say that he is trying to get all  his appointments in order and at present he has a appointment with the vascular surgeons. While at home he removes his walking shoe and keeps the leg and foot open. There is no pain and minimal drainage. 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 He tells me that over the last 2-3 weeks he has noticed pain and a wound on the crease of his left dorsal ankle and he is here for our review events. Because of the deformity in his ankle he has a fracture boot that he uses the walker. This was made at [facility] in [facility]. [facility] he was in hospital in January and suffered a wide complex cardiac arrest at the time of the fistulagram. He has recovered from this. 04/17/16 a his left dorsal ankle appears to be improved. He has an appointment with [facility] to adjust his boot next week. He does not appear to have significant PAD but does have significant venous insufficiencywent 04/24/16; went to [facility] last week,did not adjust his boot due to edema. She has called his cardiologist and in the meantime they have apparently taken some extra fluid off him at dialysis. He dialyzes Monday Wednesday and Friday [name] [name], [name] [name] ([id number]) 05/01/16; the patient arrived with some scant surface eschar over his dorsal ankle wound. This was debridement. There is nothing but normal skin under this. His leg seems to have done well with a 2 layer compression we put on this last week accompanied by extra fluid removal at dialysis's resulted in less edema of his leg. He has chronic venous insufficiency with resultant edema been eating his chronic renal failure and deformity of his left ankle. Objective Constitutional Sitting or  standing Blood Pressure is within target range for patient.. Pulse regular and within target range for patient.Marland Kitchen Respirations regular, non-labored and within target range.. Temperature is normal and within the target range for the patient.. Vitals Time Taken: 9:15 AM, Height:  75 in, Weight: 228 lbs, BMI: 28.5, Temperature: 97.9 F, Pulse: 72 bpm, Respiratory Rate: 18 breaths/min, Blood Pressure: 116/68 mmHg. Eyes Conjunctivae have some redness. Mild discharge noted. No scleral icterus. Respiratory Respiratory effort is easy and symmetric bilaterally. Rate is normal at rest and on room air.. Cardiovascular Pedal pulses at the dorsalis pedis pulses are palpable. Edema present in both extremities. The patient has severe venous insufficiency with chronic stasis physiology. Lymphatic None palpable in the popliteal or inguinal area. Psychiatric No evidence of depression, anxiety, or agitation. Calm, cooperative, and communicative. Appropriate interactions and affect.. General Notes: Wound exam; wounds on the dorsal aspect of his ankle in the setting of an inverted left ankle. After a light surface debridement with a curet there is no open wound here. His edema is under much better control. He has not had vascular studies however his ABIs are both well within the normal range done at vascular surgery locally Integumentary (Hair, Skin) Wound #5 status is Open. Original cause of wound was Gradually Appeared. The wound is located on the Left,Anterior Lower Leg. The wound measures 0cm length x 0cm width x 0cm depth; 0cm^2 area and 0cm^3 volume. The wound is limited to skin breakdown. There is no tunneling or undermining noted. There is a none present amount of drainage noted. The wound margin is thickened. There is no granulation within [name] [name], [name] [name] ([id number]) the wound bed. There is no necrotic tissue within the wound bed. The periwound skin appearance exhibited: Dry/Scaly. Periwound temperature  was noted as No Abnormality. The periwound has tenderness on palpation. 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 Primary Wound Dressing: Foam Secondary Dressing: Conform/Kerlix Dressing Change Frequency: Change dressing every other day. - Just keep new skin protected for 2 weeks. Discharge From [facility] Services: Discharge from Wound Care Center - Keep newly healed skin protected with foam for two weeks. Call us if you need anything or have any questions. #1 I think this patient is going to require some form of protection under his fracture boot over the dorsal ankle region. We will put foam on this I think as similar protective cover thick Band-Aid etc. #2 think he will benefit from compression stockings. He has some form of stocking on the right leg he got when he was in the hospital area and we have ordered to let her compression for him #3 I suggested he return to [facility] to have them look at the boot he has on his see if anything can be done to relieve the pressure on the anterior ankle. #4 is no evidence of infection. #5 I think the patient can be discharged from the clinic with the instructions given above. He knows to call us back should there be difficulties in the future. [name] [name], [name] [name] ([id number]) Electronic Signature(s) Signed: 05/02/2016 7:54:52 AM By: Baltazar Najjar MD Entered By: Baltazar Najjar on 05/01/2016 09:55:04 [name] [name] ([id number]) -------------------------------------------------------------------------------- SuperBill Details Patient Name: [name] [name] Date of Service: 05/01/2016 Medical Record Patient Account Number: [id number] Number: Treating RN: Phillis Haggis 1956/01/16 (59 y.o. 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 E11.42 Type  2 diabetes mellitus with diabetic polyneuropathy M14.672 Charcot's joint, left ankle and foot Facility Procedures CPT4 Code: [id number] Description: 99213 - WOUND CARE VISIT-LEV 3 EST PT Modifier: Quantity: 1 Physician Procedures CPT4 Code Description: [id number] 99213 - WC PHYS LEVEL 3 - EST PT ICD-10 Description Diagnosis L97.321 Non-pressure chronic ulcer of left ankle limited t Modifier: o breakdown of Quantity: 1 skin Electronic Signature(s) Signed: 05/02/2016 5:31:12 PM By: Baltazar Najjar MD Previous Signature: 05/02/2016 7:54:52 AM Version By: Baltazar Najjar MD Entered By: Baltazar Najjar on 05/02/2016 16:19:30

## 2016-05-03 NOTE — Progress Notes (Signed)
GUNTHER, ZAWADZKI ([id number]) Visit Report for 05/01/2016 Arrival Information Details Patient Name: [name] [name], [name] [name] Date of Service: 05/01/2016 8:45 AM Medical Record Number: [id number] Patient Account Number: [id number] Date of Birth/Sex: 10-11-1956 (60 y.o. Male) Treating RN: Phillis Haggis Primary Care Physician: FITZGERALD, [name] Other Clinician: Referring Physician: FITZGERALD, [name] Treating Physician/Extender: Altamese [place] in Treatment: 3 Visit Information History Since Last Visit All ordered tests and consults were completed: No Patient Arrived: Wheel Chair Added or deleted any medications: No Arrival Time: 09:15 Any new allergies or adverse reactions: No Accompanied By: self Had a fall or experienced change in No Transfer Assistance: EasyPivot activities of daily living that may affect Patient Lift risk of falls: Patient Identification Verified: Yes Signs or symptoms of abuse/neglect since last No Secondary Verification Process Yes visito Completed: Hospitalized since last visit: No Patient Requires Transmission- No Pain Present Now: No Based Precautions: Patient Has Alerts: Yes Patient Alerts: DM II Dialysis Pt ABI L non- compress Electronic Signature(s) Signed: 05/02/2016 5:26:12 PM By: Alejandro Mulling Entered By: Alejandro Mulling on 05/01/2016 09:15:49 [name] [name] ([id number]) -------------------------------------------------------------------------------- Clinic Level of Care Assessment Details Patient Name: [name] [name] Date of Service: 05/01/2016 8:45 AM Medical Record Number: [id number] Patient Account Number: [id number] Date of Birth/Sex: Nov 09, 1956 (60 y.o. Male) Treating RN: Phillis Haggis Primary Care Physician: FITZGERALD, [name] Other Clinician: Referring Physician: FITZGERALD, [name] Treating Physician/Extender: Altamese [place] in Treatment: 3 Clinic Level of Care Assessment Items TOOL 4 Quantity Score X - Use when only an EandM is  performed on FOLLOW-UP visit 1 0 ASSESSMENTS - Nursing Assessment / Reassessment X - Reassessment of Co-morbidities (includes updates in patient status) 1 10 X - Reassessment of Adherence to Treatment Plan 1 5 ASSESSMENTS - Wound and Skin Assessment / Reassessment X - Simple Wound Assessment / Reassessment - one wound 1 5 []  - Complex Wound Assessment / Reassessment - multiple wounds 0 []  - Dermatologic / Skin Assessment (not related to wound area) 0 ASSESSMENTS - Focused Assessment []  - Circumferential Edema Measurements - multi extremities 0 []  - Nutritional Assessment / Counseling / Intervention 0 []  - Lower Extremity Assessment (monofilament, tuning fork, pulses) 0 []  - Peripheral Arterial Disease Assessment (using hand held doppler) 0 ASSESSMENTS - Ostomy and/or Continence Assessment and Care []  - Incontinence Assessment and Management 0 []  - Ostomy Care Assessment and Management (repouching, etc.) 0 PROCESS - Coordination of Care X - Simple Patient / Family Education for ongoing care 1 15 []  - Complex (extensive) Patient / Family Education for ongoing care 0 []  - Staff obtains Chiropractor, Records, Test Results / Process Orders 0 []  - Staff telephones HHA, Nursing Homes / Clarify orders / etc 0 []  - Routine Transfer to another Facility (non-emergent condition) 0 [name] [name], [name] [name] ([id number]) []  - Routine Hospital Admission (non-emergent condition) 0 []  - New Admissions / Manufacturing engineer / Ordering NPWT, Apligraf, etc. 0 []  - Emergency Hospital Admission (emergent condition) 0 []  - Simple Discharge Coordination 0 X - Complex (extensive) Discharge Coordination 1 15 PROCESS - Special Needs []  - Pediatric / Minor Patient Management 0 []  - Isolation Patient Management 0 []  - Hearing / Language / Visual special needs 0 []  - Assessment of Community assistance (transportation, D/C planning, etc.) 0 []  - Additional assistance / Altered mentation 0 []  - Support Surface(s) Assessment  (bed, cushion, seat, etc.) 0 INTERVENTIONS - Wound Cleansing / Measurement []  - Simple Wound Cleansing - one wound 0 X - Complex Wound Cleansing - multiple wounds 1 5 X -  Wound Imaging (photographs - any number of wounds) 1 5  - Wound Tracing (instead of photographs) 0  - Simple Wound Measurement - one wound 0  - Complex Wound Measurement - multiple wounds 0 INTERVENTIONS - Wound Dressings  - Small Wound Dressing one or multiple wounds 0 X - Medium Wound Dressing one or multiple wounds 1 15  - Large Wound Dressing one or multiple wounds 0  - Application of Medications - topical 0  - Application of Medications - injection 0 INTERVENTIONS - Miscellaneous  - External ear exam 0 [name] [name], [name] [name] ([id number])  - Specimen Collection (cultures, biopsies, blood, body fluids, etc.) 0  - Specimen(s) / Culture(s) sent or taken to Lab for analysis 0  - Patient Transfer (multiple staff / Michiel Sites Lift / Similar devices) 0  - Simple Staple / Suture removal (25 or less) 0  - Complex Staple / Suture removal (26 or more) 0  - Hypo / Hyperglycemic Management (close monitor of Blood Glucose) 0  - Ankle / Brachial Index (ABI) - do not check if billed separately 0 X - Vital Signs 1 5 Has the patient been seen at the hospital within the last three years: Yes Total Score: 80 Level Of Care: New/Established - Level 3 Electronic Signature(s) Signed: 05/02/2016 5:26:12 PM By: Alejandro Mulling Entered By: Alejandro Mulling on 05/01/2016 10:12:22 [name] [name] ([id number]) -------------------------------------------------------------------------------- Encounter Discharge Information Details Patient Name: [name] [name] Date of Service: 05/01/2016 8:45 AM Medical Record Number: [id number] Patient Account Number: [id number] Date of Birth/Sex: [birth year] (60 y.o. Male) Treating RN: Phillis Haggis Primary Care Physician: FITZGERALD, [name] Other Clinician: Referring Physician: FITZGERALD,  [name] Treating Physician/Extender: Altamese [place] in Treatment: 3 Encounter Discharge Information Items Discharge Pain Level: 0 Discharge Condition: Stable Ambulatory Status: Wheelchair Discharge Destination: Home Transportation: Other Accompanied By: self Schedule Follow-up Appointment: No Medication Reconciliation completed Yes and provided to Patient/Care Albirda Shiel: Provided on Clinical Summary of Care: 05/01/2016 Form Type Recipient Paper Patient TA Electronic Signature(s) Signed: 05/01/2016 9:55:24 AM By: Gwenlyn Perking Entered By: Gwenlyn Perking on 05/01/2016 09:55:23 [name] [name] ([id number]) -------------------------------------------------------------------------------- Lower Extremity Assessment Details Patient Name: [name] [name] Date of Service: 05/01/2016 8:45 AM Medical Record Number: [id number] Patient Account Number: [id number] Date of Birth/Sex: [birth year] (60 y.o. Male) Treating RN: Phillis Haggis Primary Care Physician: FITZGERALD, [name] Other Clinician: Referring Physician: FITZGERALD, [name] Treating Physician/Extender: Maxwell Caul Weeks in Treatment: 3 Edema Assessment Assessed: [Left: No] [Right: No] E[Left: dema] [Right: :] Calf Left: Right: Point of Measurement: 32 cm From Medial Instep 41.6 cm cm Ankle Left: Right: Point of Measurement: 11 cm From Medial Instep 26.8 cm cm Vascular Assessment Pulses: Posterior Tibial Dorsalis Pedis Palpable: [Left:Yes] Extremity colors, hair growth, and conditions: Extremity Color: [Left:Hyperpigmented] Temperature of Extremity: [Left:Warm] Capillary Refill: [Left:< 3 seconds] Toe Nail Assessment Left: Right: Thick: Yes Discolored: Yes Deformed: Yes Improper Length and Hygiene: No Electronic Signature(s) Signed: 05/02/2016 5:26:12 PM By: Alejandro Mulling Entered By: Alejandro Mulling on 05/01/2016 09:25:30 [name] [name]  ([id number]) -------------------------------------------------------------------------------- Multi Wound Chart Details Patient Name: [name] [name] Date of Service: 05/01/2016 8:45 AM Medical Record Number: [id number] Patient Account Number: [id number] Date of Birth/Sex: [birth year] (60 y.o. Male) Treating RN: Phillis Haggis Primary Care Physician: FITZGERALD, [name] Other Clinician: Referring Physician: FITZGERALD, [name] Treating Physician/Extender: Maxwell Caul Weeks in Treatment: 3 Vital Signs Height(in): 75 Pulse(bpm): 72 Weight(lbs): 228 Blood Pressure 116/68 (mmHg): Body Mass Index(BMI): 28 Temperature(F): 97.9 Respiratory Rate 18 (breaths/min): Photos: [5:No Photos] [N/A:N/A] Wound Location: [5:Left Lower Leg - Anterior N/A]  Wounding Event: [5:Gradually Appeared] [N/A:N/A] Primary Etiology: [5:Trauma, Other] [N/A:N/A] Comorbid History: [5:Cataracts, Anemia, Arrhythmia, Congestive Heart Failure, Coronary Artery Disease, Hypertension, Myocardial Infarction, Type II Diabetes, End Stage Renal Disease, Osteoarthritis, Neuropathy] [N/A:N/A] Date Acquired: [5:04/03/2016] [N/A:N/A] Weeks of Treatment: [5:3] [N/A:N/A] Wound Status: [5:Open] [N/A:N/A] Measurements L x W x D 0x0x0 [N/A:N/A] (cm) Area (cm) : [5:0] [N/A:N/A] Volume (cm) : [5:0] [N/A:N/A] % Reduction in Area: [5:100.00%] [N/A:N/A] % Reduction in Volume: 100.00% [N/A:N/A] Classification: [5:Partial Thickness] [N/A:N/A] HBO Classification: [5:Grade 1] [N/A:N/A] Exudate Amount: [5:None Present] [N/A:N/A] Wound Margin: [5:Thickened] [N/A:N/A] Granulation Amount: [5:None Present (0%)] [N/A:N/A] Necrotic Amount: [5:None Present (0%)] [N/A:N/A] Exposed Structures: [5:Fascia: No Fat: No] [N/A:N/A] Tendon: No Muscle: No Joint: No Bone: No Limited to Skin Breakdown Epithelialization: Large (67-100%) N/A N/A Periwound Skin Texture: No Abnormalities Noted N/A N/A Periwound Skin Dry/Scaly: Yes N/A  N/A Moisture: Periwound Skin Color: No Abnormalities Noted N/A N/A Temperature: No Abnormality N/A N/A Tenderness on Yes N/A N/A Palpation: Wound Preparation: Ulcer Cleansing: N/A N/A Rinsed/Irrigated with Saline Topical Anesthetic Applied: None Treatment Notes Electronic Signature(s) Signed: 05/02/2016 5:26:12 PM By: Alejandro MullingPinkerton, [name] [name] Entered By: Alejandro MullingPinkerton, [name] [name] on 05/01/2016 09:42:28 [name] MuraABNEY, [name] [name] ([id number]) -------------------------------------------------------------------------------- Multi-Disciplinary Care Plan Details Patient Name: [name] MuraABNEY, [name] [name] Date of Service: 05/01/2016 8:45 AM Medical Record Number: [id number] Patient Account Number: [id number] Date of Birth/Sex: 06/22/1956 [age] y.o. Male) Treating RN: Phillis HaggisPinkerton, Debi Primary Care Physician: FITZGERALD, [name] Other Clinician: Referring Physician: FITZGERALD, [name] Treating Physician/Extender: Altamese CarolinaOBSON, MICHAEL G Weeks in Treatment: 3 Active Inactive Electronic Signature(s) Signed: 05/02/2016 5:26:12 PM By: Alejandro MullingPinkerton, [name] [name] Entered By: Alejandro MullingPinkerton, [name] [name] on 05/01/2016 14:14:25 [name] MuraABNEY, [name] [name] ([id number]) -------------------------------------------------------------------------------- Pain Assessment Details Patient Name: [name] MuraABNEY, [name] [name] Date of Service: 05/01/2016 8:45 AM Medical Record Number: [id number] Patient Account Number: [id number] Date of Birth/Sex: 08/08/1956 [age] y.o. Male) Treating RN: Phillis HaggisPinkerton, Debi Primary Care Physician: FITZGERALD, [name] Other Clinician: Referring Physician: FITZGERALD, [name] Treating Physician/Extender: Maxwell CaulOBSON, MICHAEL G Weeks in Treatment: 3 Active Problems Location of Pain Severity and Description of Pain Patient Has Paino No Site Locations Pain Management and Medication Current Pain Management: Electronic Signature(s) Signed: 05/02/2016 5:26:12 PM By: Alejandro MullingPinkerton, [name] [name] Entered By: Alejandro MullingPinkerton, [name] [name] on 05/01/2016 09:15:56 [name] MuraABNEY, [name] [name]  ([id number]) -------------------------------------------------------------------------------- Patient/Caregiver Education Details Patient Name: [name] MuraABNEY, [name] [name] Date of Service: 05/01/2016 8:45 AM Medical Record Number: [id number] Patient Account Number: [id number] Date of Birth/Gender: 10/26/1956 [age] y.o. Male) Treating RN: Phillis HaggisPinkerton, Debi Primary Care Physician: FITZGERALD, [name] Other Clinician: Referring Physician: FITZGERALD, [name] Treating Physician/Extender: Altamese CarolinaOBSON, MICHAEL G Weeks in Treatment: 3 Education Assessment Education Provided To: Patient Education Topics Provided Wound/Skin Impairment: Other: Keep newly healed skin protected with foam for two weeks. Call us if you need anything Handouts: or have any q Electronic Signature(s) Signed: 05/02/2016 5:26:12 PM By: Alejandro MullingPinkerton, [name] [name] Entered By: Alejandro MullingPinkerton, [name] [name] on 05/01/2016 09:53:40 [name] MuraABNEY, [name] [name] ([id number]) -------------------------------------------------------------------------------- Wound Assessment Details Patient Name: [name] MuraABNEY, [name] [name] Date of Service: 05/01/2016 8:45 AM Medical Record Number: [id number] Patient Account Number: [id number] Date of Birth/Sex: 05/10/1956 [age] y.o. Male) Treating RN: Phillis HaggisPinkerton, Debi Primary Care Physician: FITZGERALD, [name] Other Clinician: Referring Physician: FITZGERALD, [name] Treating Physician/Extender: Maxwell CaulOBSON, MICHAEL G Weeks in Treatment: 3 Wound Status Wound Number: 5 Primary Trauma, Other Etiology: Wound Location: Left Lower Leg - Anterior Wound Open Wounding Event: Gradually Appeared Status: Date Acquired: 04/03/2016 Comorbid Cataracts, Anemia, Arrhythmia, Weeks Of Treatment: 3 History: Congestive Heart Failure, Coronary Clustered Wound: No Artery Disease, Hypertension, Myocardial Infarction, Type II Diabetes, End Stage Renal Disease, Osteoarthritis, Neuropathy Photos Photo Uploaded By: Alejandro MullingPinkerton, [name] [name] on 05/01/2016 17:49:01 Wound Measurements Length: (cm) 0 %  Reductio Width: (cm) 0 % Reductio Depth: (cm) 0 Epithelial Area: (cm) 0 Tunneling Volume: (cm) 0 Undermini n in Area: 100% n in Volume: 100% ization: Large (67-100%) : No ng: No Wound Description Classification: Partial Thickness Foul Odor Diabetic Severity [address]): Grade 1 Wound Margin: Thickened Exudate Amount: None Present After Cleansing: No Wound Bed Granulation Amount: None Present (0%) Exposed Structure Necrotic Amount: None Present (0%) Fascia Exposed: No [name] [name], [name] [name] ([id number]) Fat Layer Exposed: No Tendon Exposed: No Muscle Exposed: No Joint Exposed: No Bone Exposed: No Limited to Skin Breakdown Periwound Skin Texture Texture Color No Abnormalities Noted: No No Abnormalities Noted: No Moisture Temperature / Pain No Abnormalities Noted: No Temperature: No Abnormality Dry / Scaly: Yes Tenderness on Palpation: Yes Wound Preparation Ulcer Cleansing: Rinsed/Irrigated with Saline Topical Anesthetic Applied: None Electronic Signature(s) Signed: 05/02/2016 5:26:12 PM By: Alejandro Mulling Entered By: Alejandro Mulling on 05/01/2016 09:42:05 [name] [name] ([id number]) -------------------------------------------------------------------------------- Vitals Details Patient Name: [name] [name] Date of Service: 05/01/2016 8:45 AM Medical Record Number: [id number] Patient Account Number: [id number] Date of Birth/Sex: Jun 06, 1956 (60 y.o. Male) Treating RN: Phillis Haggis Primary Care Physician: FITZGERALD, [name] Other Clinician: Referring Physician: FITZGERALD, [name] Treating Physician/Extender: Maxwell Caul Weeks in Treatment: 3 Vital Signs Time Taken: 09:15 Temperature (F): 97.9 Height (in): 75 Pulse (bpm): 72 Weight (lbs): 228 Respiratory Rate (breaths/min): 18 Body Mass Index (BMI): 28.5 Blood Pressure (mmHg): 116/68 Reference Range: 80 - 120 mg / dl Electronic Signature(s) Signed: 05/02/2016 5:26:12 PM By: Alejandro Mulling Entered By: Alejandro Mulling on 05/01/2016 09:18:13

## 2016-06-12 ENCOUNTER — Ambulatory Visit: Payer: Medicare Other | Admitting: Internal Medicine

## 2016-06-13 ENCOUNTER — Encounter: Payer: Medicare Other | Attending: Internal Medicine | Admitting: Internal Medicine

## 2016-06-13 DIAGNOSIS — L97321 Non-pressure chronic ulcer of left ankle limited to breakdown of skin: Secondary | ICD-10-CM | POA: Diagnosis present

## 2016-06-13 DIAGNOSIS — M14672 Charcot's joint, left ankle and foot: Secondary | ICD-10-CM | POA: Insufficient documentation

## 2016-06-13 DIAGNOSIS — E1122 Type 2 diabetes mellitus with diabetic chronic kidney disease: Secondary | ICD-10-CM | POA: Insufficient documentation

## 2016-06-13 DIAGNOSIS — I132 Hypertensive heart and chronic kidney disease with heart failure and with stage 5 chronic kidney disease, or end stage renal disease: Secondary | ICD-10-CM | POA: Diagnosis not present

## 2016-06-13 DIAGNOSIS — I509 Heart failure, unspecified: Secondary | ICD-10-CM | POA: Insufficient documentation

## 2016-06-13 DIAGNOSIS — D649 Anemia, unspecified: Secondary | ICD-10-CM | POA: Diagnosis not present

## 2016-06-13 DIAGNOSIS — Z992 Dependence on renal dialysis: Secondary | ICD-10-CM | POA: Insufficient documentation

## 2016-06-13 DIAGNOSIS — N186 End stage renal disease: Secondary | ICD-10-CM | POA: Diagnosis not present

## 2016-06-13 DIAGNOSIS — E1142 Type 2 diabetes mellitus with diabetic polyneuropathy: Secondary | ICD-10-CM | POA: Insufficient documentation

## 2016-06-14 NOTE — Progress Notes (Signed)
[name] [name], [name] [name] ([id number]) Visit Report for 06/13/2016 Allergy List Details Patient Name: [name] [name], Altonio Date of Service: 06/13/2016 2:15 PM Medical Record Number: [id number] Patient Account Number: [id number] Date of Birth/Sex: 08/16/1956 [age] y.o. Male) Treating RN: Clover MealyAfful, RN, BSN, [facility] Primary Care Physician: FITZGERALD, [name] Other Clinician: Referring Physician: FITZGERALD, [name] Treating Physician/Extender: Maxwell CaulOBSON, MICHAEL G Weeks in Treatment: 0 Allergies Active Allergies No Known Drug Allergies Allergy Notes Electronic Signature(s) Signed: 06/13/2016 4:58:21 PM By: Elpidio EricAfful, Rita BSN, RN Entered By: Elpidio EricAfful, Rita on 06/13/2016 14:04:20 [name] [name], [name] [name] ([id number]) -------------------------------------------------------------------------------- Arrival Information Details Patient Name: [name] [name], [name] [name] Date of Service: 06/13/2016 2:15 PM Medical Record Number: [id number] Patient Account Number: [id number] Date of Birth/Sex: 05/02/1956 [age] y.o. Male) Treating RN: Clover MealyAfful, RN, BSN, Rita Primary Care Physician: FITZGERALD, [name] Other Clinician: Referring Physician: FITZGERALD, [name] Treating Physician/Extender: Altamese CarolinaOBSON, MICHAEL G Weeks in Treatment: 0 Visit Information Patient Arrived: Wheel Chair Arrival Time: 14:08 Accompanied By: self Transfer Assistance: None Patient Identification Verified: Yes Secondary Verification Process Yes Completed: Patient Requires Transmission-Based No Precautions: Patient Has Alerts: No History Since Last Visit Added or deleted any medications: No Any new allergies or adverse reactions: No Had a fall or experienced change in activities of daily living that may affect risk of falls: No Signs or symptoms of abuse/neglect since last No visito Hospitalized since last visit: No Has Dressing in Place as Prescribed: Yes Has Footwear/Offloading in Place as Prescribed: Yes Left: Other: Electronic Signature(s) Signed: 06/13/2016 4:58:21 PM By: Elpidio EricAfful, Rita BSN,  RN Entered By: Elpidio EricAfful, Rita on 06/13/2016 14:09:25 [name] [name], [name] [name] ([id number]) -------------------------------------------------------------------------------- Encounter Discharge Information Details Patient Name: [name] [name], [name] [name] Date of Service: 06/13/2016 2:15 PM Medical Record Number: [id number] Patient Account Number: [id number] Date of Birth/Sex: 07/21/1956 [age] y.o. Male) Treating RN: Clover MealyAfful, RN, BSN, Rita Primary Care Physician: FITZGERALD, [name] Other Clinician: Referring Physician: FITZGERALD, [name] Treating Physician/Extender: Altamese CarolinaOBSON, MICHAEL G Weeks in Treatment: 0 Encounter Discharge Information Items Schedule Follow-up Appointment: No Medication Reconciliation completed No and provided to Patient/Care Thuy Atilano: Provided on Clinical Summary of Care: 06/13/2016 Form Type Recipient Paper Patient TA Electronic Signature(s) Signed: 06/13/2016 2:48:49 PM By: Gwenlyn PerkingMoore, Shelia Entered By: Gwenlyn PerkingMoore, Shelia on 06/13/2016 14:48:49 [name] [name], [name] [name] ([id number]) -------------------------------------------------------------------------------- Lower Extremity Assessment Details Patient Name: [name] [name], [name] [name] Date of Service: 06/13/2016 2:15 PM Medical Record Number: [id number] Patient Account Number: [id number] Date of Birth/Sex: 04/13/1956 [age] y.o. Male) Treating RN: Clover MealyAfful, RN, BSN, Rita Primary Care Physician: FITZGERALD, [name] Other Clinician: Referring Physician: FITZGERALD, [name] Treating Physician/Extender: Maxwell CaulOBSON, MICHAEL G Weeks in Treatment: 0 Vascular Assessment Pulses: Posterior Tibial Palpable: [Left:No] Doppler: [Left:Monophasic] Dorsalis Pedis Palpable: [Left:No] Doppler: [Left:Monophasic] Extremity colors, hair growth, and conditions: Extremity Color: [Left:Dusky] Hair Growth on Extremity: [Left:No] Temperature of Extremity: [Left:Warm] Capillary Refill: [Left:< 3 seconds] Toe Nail Assessment Left: Right: Thick: Yes Discolored: Yes Deformed: Yes Improper Length and Hygiene:  No Electronic Signature(s) Signed: 06/13/2016 4:58:21 PM By: Elpidio EricAfful, Rita BSN, RN Entered By: Elpidio EricAfful, Rita on 06/13/2016 14:15:18 [name] [name], [name] [name] ([id number]) -------------------------------------------------------------------------------- Multi Wound Chart Details Patient Name: [name] [name], [name] [name] Date of Service: 06/13/2016 2:15 PM Medical Record Number: [id number] Patient Account Number: [id number] Date of Birth/Sex: 08/29/1956 [age] y.o. 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] [N/A:N/A the Lower Extremity] Comorbid History: [6:Cataracts, Anemia, Arrhythmia, Congestive Heart Failure, Coronary Artery Disease, Hypertension, Myocardial Infarction, Type II Diabetes, End Stage Renal Disease, Osteoarthritis, Neuropathy] [N/A:N/A N/A] Date Acquired: [6:05/16/2016] [N/A:05/16/2016 N/A] Weeks of Treatment: [6:0] [N/A:0 N/A] Wound Status: [6:Open] [N/A:Converted N/A] Measurements L x W x D 0.7x2x2 [N/A:0x0x0 N/A] (cm) Area (cm) : [6:1.1] [N/A:0 N/A] Volume (cm) : [6:2.199] [N/A:0 N/A] Classification: [6:Grade 1] [N/A:N/A N/A] Exudate Amount: [6:Medium] [N/A:N/A N/A] Exudate Type: [6:Serosanguineous] [N/A:N/A N/A] Exudate Color: [6:red, brown] [N/A:N/A N/A] Wound Margin: [6:Distinct, outline attached N/A] [N/A:N/A] Granulation Amount: [6:Large (67-100%)] [N/A:N/A N/A] Granulation Quality:  [6:Pink, Pale] [N/A:N/A N/A] Necrotic Amount: [6:Small (1-33%)] [N/A:N/A N/A] Exposed Structures: [N/A:N/A N/A] Fascia: No Fat: No Tendon: No Muscle: No Joint: No Bone: No Limited to Skin Breakdown Debridement: Debridement (30865- N/A N/A 11047) Time-Out Taken: Yes N/A N/A Pain Control: Lidocaine 4% Topical N/A N/A Solution Tissue Debrided: Fibrin/Slough, N/A N/A Subcutaneous Level: Skin/Subcutaneous N/A N/A Tissue Debridement Area (sq 1.4 N/A N/A cm): Instrument: Curette N/A N/A Bleeding: Minimum N/A N/A Hemostasis Achieved: Pressure N/A N/A Procedural Pain: 0 N/A N/A Post Procedural Pain: 0 N/A N/A Debridement Treatment Procedure was tolerated N/A N/A Response: well Periwound Skin Texture: Edema: No No Abnormalities Noted N/A Excoriation: No Induration: No Callus: No Crepitus: No Fluctuance: No Friable: No Rash: No Scarring: No Periwound Skin Moist: Yes No Abnormalities Noted N/A Moisture: Maceration: No Dry/Scaly: No Periwound Skin Color: Atrophie Blanche: No No Abnormalities Noted N/A Cyanosis: No Ecchymosis: No Erythema: No Hemosiderin Staining: No Mottled: No Pallor: No Rubor: No Temperature: No Abnormality N/A N/A Tenderness on No No N/A Palpation: Wound Preparation: N/A N/A Feltman, Aurther Loft ([id number]) Ulcer Cleansing: Rinsed/Irrigated with Saline Topical Anesthetic Applied: Other: lidocaine 4% Procedures Performed: Debridement N/A N/A Treatment Notes Electronic Signature(s) Signed: 06/13/2016 4:58:21 PM By: Elpidio Eric BSN, RN Entered By: Elpidio Eric on 06/13/2016 14:35:24 [name] Mura ([id number]) -------------------------------------------------------------------------------- Multi-Disciplinary Care Plan Details Patient Name: [name] Mura Date of Service: 06/13/2016 2:15 PM Medical Record Number: [id number] Patient Account Number: [id number] Date of Birth/Sex: 1956-02-11 (60 y.o. Male) Treating RN: Clover Mealy, RN, BSN, Rita Primary Care  Physician: FITZGERALD, [name] Other Clinician: Referring Physician: FITZGERALD, [name] Treating Physician/Extender: Altamese [place] in Treatment: 0 Active Inactive Orientation to the Wound Care Program Nursing Diagnoses: Knowledge deficit related to the wound healing center program Goals: Patient/caregiver will verbalize understanding of the Wound Healing Center Program Date Initiated: 06/13/2016 Goal Status: Active Interventions: Provide education on orientation to the wound center Notes: Wound/Skin Impairment Nursing Diagnoses: Impaired tissue integrity Knowledge deficit related to ulceration/compromised skin integrity Goals: Patient/caregiver will verbalize understanding of skin care regimen Date Initiated: 06/13/2016 Goal Status: Active Ulcer/skin breakdown will have a volume reduction of 30% by week 4 Date Initiated: 06/13/2016 Goal Status: Active Ulcer/skin breakdown will have a volume reduction of 50% by week 8 Date Initiated: 06/13/2016 Goal Status: Active Ulcer/skin breakdown will have a volume reduction of 80% by week 12 Date Initiated: 06/13/2016 Goal Status: Active Ulcer/skin breakdown will heal within 14 weeks Date Initiated: 06/13/2016 CHAIS, FEHRINGER ([id number]) Goal Status: Active Interventions: Assess patient/caregiver ability to obtain necessary supplies Assess patient/caregiver ability to perform ulcer/skin care regimen upon admission and as needed Provide education on ulcer and skin care Notes: Electronic Signature(s) Signed: 06/13/2016 4:58:21 PM By: Elpidio Eric BSN, RN Entered By: Elpidio Eric on 06/13/2016 14:35:10 [name] Mura ([id number]) -------------------------------------------------------------------------------- Pain Assessment Details Patient Name: [name] Mura Date  of Service: 06/13/2016 2:15 PM Medical Record Number: [id number] Patient Account Number: [id number] Date of Birth/Sex: 05/20/1956 [age] y.o. Male) Treating RN: Clover MealyAfful, RN, BSN,  [facility] Primary Care Physician: FITZGERALD, [name] Other Clinician: Referring Physician: FITZGERALD, [name] Treating Physician/Extender: Maxwell CaulOBSON, MICHAEL G Weeks in Treatment: 0 Active Problems Location of Pain Severity and Description of Pain Patient Has Paino No Site Locations With Dressing Change: No Pain Management and Medication Current Pain Management: Electronic Signature(s) Signed: 06/13/2016 4:58:21 PM By: Elpidio EricAfful, Rita BSN, RN Entered By: Elpidio EricAfful, Rita on 06/13/2016 14:09:33 [name] [name], Keo ([id number]) -------------------------------------------------------------------------------- Wound Assessment Details Patient Name: [name] [name], Cordie Date of Service: 06/13/2016 2:15 PM Medical Record Number: [id number] Patient Account Number: [id number] Date of Birth/Sex: 11/02/1956 [age] y.o. Male) Treating RN: Clover MealyAfful, RN, BSN, Rita Primary Care Physician: FITZGERALD, [name] Other Clinician: Referring Physician: FITZGERALD, [name] Treating Physician/Extender: Maxwell CaulOBSON, MICHAEL G Weeks in Treatment: 0 Wound Status Wound Number: 6 Primary Diabetic Wound/Ulcer of the Lower Etiology: Extremity Wound Location: Left Lower Leg - Anterior Wound Open Wounding Event: Gradually Appeared Status: Date Acquired: 05/16/2016 Comorbid Cataracts, Anemia, Arrhythmia, Weeks Of Treatment: 0 History: Congestive Heart Failure, Coronary Clustered Wound: No Artery Disease, Hypertension, Myocardial Infarction, Type II Diabetes, End Stage Renal Disease, Osteoarthritis, Neuropathy Photos Photo Uploaded By: Elpidio EricAfful, Rita on 06/13/2016 16:53:46 Wound Measurements Length: (cm) 0.7 % Reduction Width: (cm) 2 % Reduction Depth: (cm) 2 Area: (cm) 1.1 Volume: (cm) 2.199 in Area: in Volume: Wound Description Classification: Grade 1 Wound Margin: Distinct, outline attached Exudate Amount: Medium Exudate Type: Serosanguineous Exudate Color: red, brown Foul Odor After Cleansing: No Wound Bed Granulation Amount: Large  (67-100%) Exposed Structure Leitzel, Esa ([id number]) Granulation Quality: Pink, Pale Fascia Exposed: No Necrotic Amount: Small (1-33%) Fat Layer Exposed: No Necrotic Quality: Adherent Slough Tendon Exposed: No Muscle Exposed: No Joint Exposed: No Bone Exposed: No Limited to Skin Breakdown Periwound Skin Texture Texture Color No Abnormalities Noted: No No Abnormalities Noted: No Callus: No Atrophie Blanche: No Crepitus: No Cyanosis: No Excoriation: No Ecchymosis: No Fluctuance: No Erythema: No Friable: No Hemosiderin Staining: No Induration: No Mottled: No Localized Edema: No Pallor: No Rash: No Rubor: No Scarring: No Temperature / Pain Moisture Temperature: No Abnormality No Abnormalities Noted: No Dry / Scaly: No Maceration: No Moist: Yes Wound Preparation Ulcer Cleansing: Rinsed/Irrigated with Saline Topical Anesthetic Applied: Other: lidocaine 4%, Electronic Signature(s) Signed: 06/13/2016 4:58:21 PM By: Elpidio EricAfful, Rita BSN, RN Entered By: Elpidio EricAfful, Rita on 06/13/2016 14:17:43 [name] [name], Adren ([id number]) -------------------------------------------------------------------------------- Wound Assessment Details Patient Name: [name] [name], Sanav Date of Service: 06/13/2016 2:15 PM Medical Record Number: [id number] Patient Account Number: [id number] Date of Birth/Sex: 10/15/1956 [age] y.o. Male) Treating RN: Clover MealyAfful, RN, BSN, Rita Primary Care Physician: FITZGERALD, [name] Other Clinician: Referring Physician: FITZGERALD, [name] Treating Physician/Extender: Maxwell CaulOBSON, MICHAEL G Weeks in Treatment: 0 Wound Status Wound Number: 6 Primary Diabetic Wound/Ulcer of the Lower Etiology: Extremity Wound Location: Left, Anterior Lower Leg Wound Status: Converted Wounding Event: Gradually Appeared Date Acquired: 05/16/2016 Weeks Of Treatment: 0 Clustered Wound: No Photos Photo Uploaded By: Elpidio EricAfful, Rita on 06/13/2016 16:53:47 Wound Measurements Length: (cm) Width: (cm) Depth: (cm) Area:  (cm) Volume: (cm) 0 % Reduction in Area: 0 % Reduction in Volume: 0 0 0 Periwound Skin Texture Texture Color No Abnormalities Noted: No No Abnormalities Noted: No Moisture No Abnormalities Noted: No Electronic Signature(s) Signed: 06/13/2016 4:58:21 PM By: Elpidio EricAfful, Rita BSN, RN Entered By: Elpidio EricAfful, Rita on 06/13/2016 14:32:49 [name] [name], Sasan ([id number]) -------------------------------------------------------------------------------- Vitals Details Patient Name: [name] [name], Allyn Date of Service: 06/13/2016 2:15 PM Medical Record Number:  [id number] Patient Account Number: [id number] Date of Birth/Sex: 05/14/1956 (60 y.o. Male) Treating RN: Clover Mealy, RN, BSN, [facility] Primary Care Physician: FITZGERALD, [name] Other Clinician: Referring Physician: FITZGERALD, [name] Treating Physician/Extender: Maxwell Caul Weeks in Treatment: 0 Vital Signs Time Taken: 14:11 Temperature (F): 98.2 Height (in): 72 Pulse (bpm): 86 Source: Stated Respiratory Rate (breaths/min): 16 Source: Stated Blood Pressure (mmHg): 106/61 Reference Range: 80 - 120 mg / dl Electronic Signature(s) Signed: 06/13/2016 4:58:21 PM By: Elpidio Eric BSN, RN Entered By: Elpidio Eric on 06/13/2016 14:12:54

## 2016-06-14 NOTE — Progress Notes (Signed)
[name] [name], Gerlad ([id number]) Visit Report for 06/13/2016 Abuse/Suicide Risk Screen Details Patient Name: [name] [name], [name] [name] Date of Service: 06/13/2016 2:15 PM Medical Record Patient Account Number: [id number] Number: Treating RN: Clover MealyAfful, RN, BSN, Rita 01/31/1956 [phone](59 y.o. Other Clinician: Date of Birth/Sex: Male) Treating ROBSON, MICHAEL Primary Care Physician: Sampson GoonFITZGERALD, [name] Physician/Extender: G Referring Physician: FITZGERALD, [name] Weeks in Treatment: 0 Abuse/Suicide Risk Screen Items Answer ABUSE/SUICIDE RISK SCREEN: Has anyone close to you tried to hurt or harm you recentlyo No Do you feel uncomfortable with anyone in your familyo No Has anyone forced you do things that you didnot want to doo No Do you have any thoughts of harming yourselfo No Patient displays signs or symptoms of abuse and/or neglect. No Electronic Signature(s) Signed: 06/13/2016 4:58:21 PM By: Elpidio EricAfful, Rita BSN, RN Entered By: Elpidio EricAfful, Rita on 06/13/2016 14:04:30 [name] MuraABNEY, Osiris ([id number]) -------------------------------------------------------------------------------- Activities of Daily Living Details Patient Name: [name] MuraABNEY, [name] [name] Date of Service: 06/13/2016 2:15 PM Medical Record Patient Account Number: [id number] Number: Treating RN: Clover MealyAfful, RN, BSN, Rita 01/26/1956 [phone](59 y.o. Other Clinician: Date of Birth/Sex: Male) Treating ROBSON, MICHAEL Primary Care Physician: Sampson GoonFITZGERALD, [name] Physician/Extender: G Referring Physician: FITZGERALD, [name] Weeks in Treatment: 0 Activities of Daily Living Items Answer Activities of Daily Living (Please select one for each item) Drive Automobile Not Able Take Medications Completely Able Use Telephone Completely Able Care for Appearance Completely Able Use Toilet Completely Able Bath / Shower Completely Able Dress Self Completely Able Feed Self Completely Able Walk Need Assistance Get In / Out Bed Need Assistance Housework Need Assistance Prepare Meals  Need Assistance Handle Money Completely Able Shop for Self Need Assistance Electronic Signature(s) Signed: 06/13/2016 4:58:21 PM By: Elpidio EricAfful, Rita BSN, RN Entered By: Elpidio EricAfful, Rita on 06/13/2016 14:06:15 [name] MuraABNEY, [name] [name] ([id number]) -------------------------------------------------------------------------------- Education Assessment Details Patient Name: [name] MuraABNEY, Junius Date of Service: 06/13/2016 2:15 PM Medical Record Patient Account Number: [id number] Number: Treating RN: Clover MealyAfful, RN, BSN, Rita 12/25/1955 (59 y.o. Other Clinician: Date of Birth/Sex: Male) Treating ROBSON, MICHAEL Primary Care Physician: Sampson GoonFITZGERALD, [name] Physician/Extender: G Referring Physician: FITZGERALD, [name] Weeks in Treatment: 0 Primary Learner Assessed: Patient Learning Preferences/Education Level/Primary Language Learning Preference: Explanation Highest Education Level: High School Preferred Language: English Cognitive Barrier Assessment/Beliefs Language Barrier: No Physical Barrier Assessment Impaired Vision: No Impaired Hearing: No Decreased Hand dexterity: No Knowledge/Comprehension Assessment Knowledge Level: High Comprehension Level: High Ability to understand written High instructions: Ability to understand verbal High instructions: Motivation Assessment Anxiety Level: Calm Cooperation: Cooperative Education Importance: Acknowledges Need Interest in Health Problems: Asks Questions Perception: Coherent Willingness to Engage in Self- High Management Activities: Readiness to Engage in Self- High Management Activities: Electronic Signature(s) Signed: 06/13/2016 4:58:21 PM By: Elpidio EricAfful, Rita BSN, RN Entered By: Elpidio EricAfful, Rita on 06/13/2016 14:05:33 [name] MuraABNEY, Knolan ([id number]) [name] MuraABNEY, [name] [name] ([id number]) -------------------------------------------------------------------------------- Fall Risk Assessment Details Patient Name: [name] MuraABNEY, [name] [name] Date of Service: 06/13/2016 2:15 PM Medical Record Patient  Account Number: [id number] Number: Treating RN: Clover MealyAfful, RN, BSN, Rita 08/18/1956 [phone](59 y.o. Other Clinician: Date of Birth/Sex: Male) Treating ROBSON, MICHAEL Primary Care Physician: Sampson GoonFITZGERALD, [name] Physician/Extender: G Referring Physician: FITZGERALD, [name] Weeks in Treatment: 0 Fall Risk Assessment Items Have you had 2 or more falls in the last 12 monthso 0 No Have you had any fall that resulted in injury in the last 12 monthso 0 No FALL RISK ASSESSMENT: History of falling - immediate or within 3 months 0 No Secondary diagnosis 0 No Ambulatory aid None/bed rest/wheelchair/nurse 0 Yes Crutches/cane/walker 0 No Furniture 0 No IV Access/Saline Lock 0 No Gait/Training  Normal/bed rest/immobile 0 No Weak 10 Yes Impaired 0 No Mental Status Oriented to own ability 0 Yes Electronic Signature(s) Signed: 06/13/2016 4:58:21 PM By: Elpidio EricAfful, Rita BSN, RN Entered By: Elpidio EricAfful, Rita on 06/13/2016 14:04:46 [name] MuraABNEY, Zeev ([id number]) -------------------------------------------------------------------------------- Foot Assessment Details Patient Name: [name] MuraABNEY, [name] [name] Date of Service: 06/13/2016 2:15 PM Medical Record Patient Account Number: [id number] Number: Treating RN: Clover MealyAfful, RN, BSN, Rita 08/12/1956 [phone](59 y.o. Other Clinician: Date of Birth/Sex: Male) Treating ROBSON, MICHAEL Primary Care Physician: Sampson GoonFITZGERALD, [name] Physician/Extender: G Referring Physician: FITZGERALD, [name] Weeks in Treatment: 0 Foot Assessment Items Site Locations + = Sensation present, - = Sensation absent, C = Callus, U = Ulcer R = Redness, W = Warmth, M = Maceration, PU = Pre-ulcerative lesion F = Fissure, S = Swelling, D = Dryness Assessment Right: Left: Other Deformity: No No Prior Foot Ulcer: No No Prior Amputation: No No Charcot Joint: No No Ambulatory Status: Non-ambulatory Assistance Device: Wheelchair Gait: Surveyor, miningUnsteady Electronic Signature(s) Signed: 06/13/2016 4:58:21 PM By: Elpidio EricAfful, Rita  BSN, RN Entered By: Elpidio EricAfful, Rita on 06/13/2016 14:04:57 [name] MuraABNEY, Vickie ([id number]) [name] MuraABNEY, [place] ([id number]) -------------------------------------------------------------------------------- Nutrition Risk Assessment Details Patient Name: [name] MuraABNEY, Jarrett Date of Service: 06/13/2016 2:15 PM Medical Record Patient Account Number: [id number] Number: Treating RN: Clover MealyAfful, RN, BSN, Rita 05/27/1956 [phone](59 y.o. Other Clinician: Date of Birth/Sex: Male) Treating ROBSON, MICHAEL Primary Care Physician: Sampson GoonFITZGERALD, [name] Physician/Extender: G Referring Physician: FITZGERALD, [name] Weeks in Treatment: 0 Height (in): 75 Weight (lbs): 228 Body Mass Index (BMI): 28.5 Nutrition Risk Assessment Items NUTRITION RISK SCREEN: I have an illness or condition that made me change the kind and/or 0 No amount of food I eat I eat fewer than two meals per day 0 No I eat few fruits and vegetables, or milk products 0 No I have three or more drinks of beer, liquor or wine almost every day 0 No I have tooth or mouth problems that make it hard for me to eat 0 No I don't always have enough money to buy the food I need 0 No I eat alone most of the time 0 No I take three or more different prescribed or over-the-counter drugs a 0 No day Without wanting to, I have lost or gained 10 pounds in the last six 0 No months I am not always physically able to shop, cook and/or feed myself 0 No Nutrition Protocols Good Risk Protocol 0 No interventions needed Moderate Risk Protocol Electronic Signature(s) Signed: 06/13/2016 4:58:21 PM By: Elpidio EricAfful, Rita BSN, RN Entered By: Elpidio EricAfful, Rita on 06/13/2016 14:05:06

## 2016-06-14 NOTE — Progress Notes (Signed)
[name] [name], [name] [name] ([id number]) Visit Report for 06/13/2016 Chief Complaint Document Details Patient Name: [name] [name], [name] [name] Date of Service: 06/13/2016 2:15 PM Medical Record Patient Account Number: [id number] Number: Treating RN: [name] Mealy, RN, BSN, Rita 1956-12-05 [phone] y.o. Other Clinician: Date of Birth/Sex: Male) Treating ROBSON, [name] Primary Care Physician: [name] [name], [name] Physician/Extender: G Referring Physician: FITZGERALD, [name] Weeks in Treatment: 0 Information Obtained from: Patient Chief Complaint Right second toe wound and left lateral malleolar wound. is doing fine and has no fresh issues. He does have his appointment with the vascular surgeons. However he does not have an appointment with the orthopedic surgeons yet. he did not come for 2 weeks because of the bad weather. He has no fresh issues. 04/10/16; the patient returns today with a wound over the last 2-3 weeks on the anterior part of his left ankle. Electronic Signature(s) Signed: 06/13/2016 4:37:54 PM By: [name] Najjar MD Entered By: [name] [name] on 06/13/2016 16:22:33 [name] [name] ([id number]) -------------------------------------------------------------------------------- Debridement Details Patient Name: [name] [name] Date of Service: 06/13/2016 2:15 PM Medical Record Patient Account Number: [id number] Number: Treating RN: [name] Mealy, RN, BSN, Rita Aug 14, 1956 [phone] y.o. Other Clinician: Date of Birth/Sex: Male) Treating ROBSON, [name] Primary Care Physician: [name] [name], [name] Physician/Extender: G Referring Physician: FITZGERALD, [name] Weeks in Treatment: 0 Debridement Performed for Wound #6 Left,Anterior Lower Leg Assessment: Performed By: Physician [name] Caul, MD Debridement: Debridement Pre-procedure Yes Verification/Time Out Taken: Start Time: 14:32 Pain Control: Lidocaine 4% Topical Solution Level: Skin/Subcutaneous Tissue Total Area Debrided (L x 0.7 (cm) x 2 (cm) = 1.4  (cm) W): Tissue and other Non-Viable, Fibrin/Slough, Subcutaneous material debrided: Instrument: Curette Bleeding: Minimum Hemostasis Achieved: Pressure End Time: 14:37 Procedural Pain: 0 Post Procedural Pain: 0 Response to Treatment: Procedure was tolerated well Post Procedure Diagnosis Same as Pre-procedure Electronic Signature(s) Signed: 06/13/2016 4:37:54 PM By: [name] Najjar MD Signed: 06/13/2016 4:58:21 PM By: [name] [name] BSN, RN Entered By: [name] [name] on 06/13/2016 16:22:22 [name] [name] ([id number]) -------------------------------------------------------------------------------- HPI Details Patient Name: [name] [name] Date of Service: 06/13/2016 2:15 PM Medical Record Patient Account Number: [id number] Number: Treating RN: [name] Mealy, RN, BSN, Rita 01-05-1956 (59 y.o. Other Clinician: Date of Birth/Sex: Male) Treating ROBSON, [name] Primary Care Physician: [name] [name], [name] Physician/Extender: G Referring Physician: FITZGERALD, [name] Weeks in Treatment: 0 History of Present Illness Location: left fourth toe Quality: Patient reports experiencing a dull pain to affected area(s). Severity: Patient states wound (s) are getting better. Duration: Patient states that they are not certain how long the wound has been present. Timing: Pain in wound is constant (hurts all the time) Context: The wound appeared gradually over time Associated Signs and Symptoms: nil HPI Description: has been doing fine and has no fresh issues. He does say that he is trying to get all his appointments in order and at present he has a appointment with the vascular surgeons. While at home he removes his walking shoe and keeps the leg and foot open. There is no pain and minimal drainage. 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 He also has chronic deformity of the left ankle with chronic inversion she states  was from an ankle fracture in 2014 that he continued to walk on due to a need to continue to work. He tells me that over the last 2-3 weeks he has noticed pain and a wound on the crease of his left dorsal ankle and he is here for our review  events. Because of the deformity in his ankle he has a fracture boot that he uses the walker. This was made at [employer] in [facility]. [facility] he was in hospital in January and suffered a wide complex cardiac arrest at the time of the fistulagram. He has recovered from this. 04/17/16 a his left dorsal ankle appears to be improved. He has an appointment with [employer] to adjust his boot next week. He does not appear to have significant PAD but does have significant venous insufficiencywent 04/24/16; went to [facility] last week,did not adjust his boot due to edema. She has called his cardiologist and in the meantime they have apparently taken some extra fluid off him at dialysis. He dialyzes Monday Wednesday and Friday 05/01/16; the patient arrived with some scant surface eschar over his dorsal ankle wound. This was debridement. There is nothing but normal skin under this. His leg seems to have done well with a 2 layer compression we put on this last week accompanied by extra fluid removal at dialysis's resulted in less edema of his leg. He has chronic venous insufficiency with resultant edema been eating his chronic renal failure and deformity of his left ankle. 06/13/16; the patient arrives today with a recurrent wound in the same spot as last time. He has chronic venous insufficiency also deformity of the foot and ankle likely secondary to Charcot deformity. The patient has type 2 diabetes with polyneuropathy. He states the stockings he was wearing were too tight and he feels that's why it rubbed his ankle. He also has a Systems developer, that was adjusted last time. [name] [name] ([id number]) Electronic Signature(s) Signed:  06/13/2016 4:37:54 PM By: [name] Najjar MD Entered By: [name] [name] on 06/13/2016 16:26:55 [name] [name] ([id number]) -------------------------------------------------------------------------------- Physical Exam Details Patient Name: [name] [name] Date of Service: 06/13/2016 2:15 PM Medical Record Patient Account Number: [id number] Number: Treating RN: [name] Mealy, RN, BSN, Rita 08-24-1956 [phone] y.o. Other Clinician: Date of Birth/Sex: Male) Treating ROBSON, [name] Primary Care Physician: [name] [name], [name] Physician/Extender: G Referring Physician: FITZGERALD, [name] Weeks in Treatment: 0 Respiratory Respiratory effort is easy and symmetric bilaterally. Rate is normal at rest and on room air.. Cardiovascular Pedal pulses palpable and strong bilaterally.. Edema present in both extremities. Severe venous insufficiency. Notes The wound is in precisely the same places last time on the dorsal aspect of his left ankle. This perhaps is deeper than last time and required a more aggressive debridement there is no evidence of infection. Electronic Signature(s) Signed: 06/13/2016 4:37:54 PM By: [name] Najjar MD Entered By: [name] [name] on 06/13/2016 16:35:12 [name] [name] ([id number]) -------------------------------------------------------------------------------- Physician Orders Details Patient Name: [name] [name] Date of Service: 06/13/2016 2:15 PM Medical Record Patient Account Number: [id number] Number: Treating RN: [name] Mealy, RN, BSN, Rita [birth year] [phone] y.o. Other Clinician: Date of Birth/Sex: Male) Treating ROBSON, [name] Primary Care Physician: [name] [name], [name] Physician/Extender: G Referring Physician: FITZGERALD, [name] Weeks in Treatment: 0 Verbal / Phone Orders: Yes Clinician: Afful, RN, BSN, Rita Read Back and Verified: Yes Diagnosis Coding Wound Cleansing Wound #6 Left,Anterior Lower Leg o Clean wound with Normal Saline. Anesthetic Wound #6  Left,Anterior Lower Leg o Topical Lidocaine 4% cream applied to wound bed prior to debridement Skin Barriers/Peri-Wound Care Wound #6 Left,Anterior Lower Leg o Barrier cream Primary Wound Dressing Wound #6 Left,Anterior Lower Leg o Aquacel Ag Secondary Dressing Wound #6 Left,Anterior Lower Leg o Gauze and Kerlix/Conform Dressing Change Frequency Wound #6 Left,Anterior Lower Leg o Change dressing every other day. Follow-up Appointments  Wound #6 Left,Anterior Lower Leg o Return Appointment in 1 week. Edema Control Wound #6 Left,Anterior Lower Leg o 2 Layer Compression System - Left Lower Extremity Martello, Bernarr ([id number]) Off-Loading Wound #6 Left,Anterior Lower Leg o Turn and reposition every 2 hours - Stay off foot and try to keep boot off as long as possible. Electronic Signature(s) Signed: 06/13/2016 4:37:54 PM By: [name] Najjar MD Signed: 06/13/2016 4:58:21 PM By: [name] [name] BSN, RN Entered By: [name] [name] on 06/13/2016 14:38:27 [name] [name] ([id number]) -------------------------------------------------------------------------------- Problem List Details Patient Name: [name] [name] Date of Service: 06/13/2016 2:15 PM Medical Record Patient Account Number: [id number] Number: Treating RN: [name] Mealy, RN, BSN, Rita 05/19/1956 [phone] y.o. 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 limited to 06/13/2016 Yes breakdown of skin E11.42 Type 2 diabetes mellitus with diabetic polyneuropathy 06/13/2016 Yes M14.672 Charcot's joint, left ankle and foot 06/13/2016 Yes Inactive Problems Resolved Problems Electronic Signature(s) Signed: 06/13/2016 4:37:54 PM By: [name] Najjar MD Entered By:  [name] [name] on 06/13/2016 16:22:10 [name] [name] ([id number]) -------------------------------------------------------------------------------- Progress Note Details Patient Name: [name] [name] Date of Service: 06/13/2016 2:15 PM Medical Record Patient Account Number: [id number] Number: Treating RN: [name] Mealy, RN, BSN, Rita 12-11-1956 [phone] y.o. Other Clinician: Date of Birth/Sex: Male) Treating ROBSON, [name] Primary Care Physician: [name] [name], [name] Physician/Extender: G Referring Physician: FITZGERALD, [name] Weeks in Treatment: 0 Subjective Chief Complaint Information obtained from Patient Right second toe wound and left lateral malleolar wound. is doing fine and has no fresh issues. He does have his appointment with the vascular surgeons. However he does not have an appointment with the orthopedic surgeons yet. he did not come for 2 weeks because of the bad weather. He has no fresh issues. 04/10/16; the patient returns today with a wound over the last 2-3 weeks on the anterior part of his left ankle. History of Present Illness (HPI) The following HPI elements were documented for the patient's wound: Location: left fourth toe Quality: Patient reports experiencing a dull pain to affected area(s). Severity: Patient states wound (s) are getting better. Duration: Patient states that they are not certain how long the wound has been present. Timing: Pain in wound is constant (hurts all the time) Context: The wound appeared gradually over time Associated Signs and Symptoms: nil has been doing fine and has no fresh issues. He does say that he is trying to get all his appointments in order and at present he has a appointment with the vascular surgeons. While at home he removes his walking shoe and keeps the leg and foot open. There is no pain and minimal drainage. 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 He tells me that over the last 2-3 weeks he has noticed pain and a wound on the crease of his left dorsal ankle and he is here for our review events. Because of the deformity in his ankle he has a fracture boot that he uses the walker. This was made at [employer] in [facility]. [facility] he was in  hospital in January and suffered a wide complex cardiac arrest at the time of the fistulagram. He has recovered from this. 04/17/16 a his left dorsal ankle appears to be improved. He has an appointment with [employer] to adjust his boot next week. He does not appear to have significant PAD but does have significant venous insufficiencywent 04/24/16; went to [facility] last week,did not adjust his boot due to edema. She has called his cardiologist and in the meantime they have apparently taken some extra fluid off him at dialysis. He dialyzes Monday Wednesday and Friday [name] [name], [name] [name] ([id number]) 05/01/16; the patient arrived with some scant surface eschar over his dorsal ankle wound. This was debridement. There is nothing but normal skin under this. His leg seems to have done well with a 2 layer compression we put on this last week accompanied by extra fluid removal at dialysis's resulted in less edema of his leg. He has chronic venous insufficiency with resultant edema been eating his chronic renal failure and deformity of his left ankle. 06/13/16; the patient arrives today with a recurrent wound in the same spot as last time. He has chronic venous insufficiency also deformity of the foot and ankle likely secondary to Charcot deformity. The patient has type 2 diabetes with polyneuropathy. He states the stockings he was wearing were too tight and he feels that's why it rubbed his ankle. He also  has a Systems developerCharcot boot, that was adjusted last time. Wound History Patient presents with 1 open wound that has been present for approximately 2mos. Patient has been treating wound in the following manner: dry dressing. The wound has been healed in the past but has re- opened. Laboratory tests have not been performed in the last month. Patient reportedly has not tested positive for an antibiotic resistant organism. Patient reportedly has not tested positive for osteomyelitis. Patient History Information obtained from Patient. Allergies No Known Drug Allergies Family History Cancer - Father, Siblings, Diabetes - Father, Siblings, Heart Disease - Father, Hypertension - Father, Kidney Disease - Siblings, Lung Disease - Father, Stroke - Mother, No family history of Hereditary Spherocytosis, Seizures, Thyroid Problems, Tuberculosis. Social History Never smoker, Marital Status - Single, Alcohol Use - Never, Drug Use - No History, Caffeine Use - Moderate. Medical And Surgical History Notes Musculoskeletal arthritis Review of Systems (ROS) Eyes The patient has no complaints or symptoms. Hematologic/Lymphatic The patient has no complaints or symptoms. Respiratory The patient has no complaints or symptoms. Cardiovascular Complains or has symptoms of LE edema. Gastrointestinal The patient has no complaints or symptoms. Endocrine [name] [name], [name] [name] ([id number]) The patient has no complaints or symptoms. Immunological The patient has no complaints or symptoms. Integumentary (Skin) Complains or has symptoms of Wounds, Breakdown. Neurologic The patient has no complaints or symptoms. Oncologic The patient has no complaints or symptoms. Psychiatric The patient has no complaints or symptoms. Objective Constitutional Vitals Time Taken: 2:11 PM, Height: 72 in, Source: Stated, Source: Stated, Temperature: 98.2 F, Pulse: 86 bpm, Respiratory Rate: 16 breaths/min, Blood Pressure: 106/61  mmHg. Respiratory Respiratory effort is easy and symmetric bilaterally. Rate is normal at rest and on room air.. Cardiovascular Pedal pulses palpable and strong bilaterally.. Edema present in both extremities. Severe venous insufficiency. General Notes: The wound is in precisely the same places last time on the dorsal aspect of his left ankle. This perhaps is deeper than last time and required a more aggressive debridement there is no evidence of infection. Integumentary (Hair, Skin) Wound #6 status is Open. Original cause of wound  was Gradually Appeared. The wound is located on the Left,Anterior Lower Leg. The wound measures 0.7cm length x 2cm width x 2cm depth; 1.1cm^2 area and 2.199cm^3 volume. The wound is limited to skin breakdown. There is a medium amount of serosanguineous drainage noted. The wound margin is distinct with the outline attached to the wound base. There is large (67-100%) pink, pale granulation within the wound bed. There is a small (1-33%) amount of necrotic tissue within the wound bed including Adherent Slough. The periwound skin appearance exhibited: Moist. The periwound skin appearance did not exhibit: Callus, Crepitus, Excoriation, Fluctuance, Friable, Induration, Localized Edema, Rash, Scarring, Dry/Scaly, Maceration, Atrophie Blanche, Cyanosis, Ecchymosis, Hemosiderin Staining, Mottled, Pallor, Rubor, Erythema. Periwound temperature was noted as No Abnormality. Wound #6 status is Converted. Original cause of wound was Gradually Appeared. The wound is located on the Left,Anterior Lower Leg. The wound measures 0cm length x 0cm width x 0cm depth; 0cm^2 area and [name] [name], [name] [name] ([id number]) 0cm^3 volume. 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 breakdown of skin E11.42 - Type 2 diabetes mellitus with diabetic polyneuropathy M14.672 - Charcot's joint, left ankle and foot Procedures Wound #6 Wound #6 is a  Diabetic Wound/Ulcer of the Lower Extremity located on the Left,Anterior Lower Leg . There was a Skin/Subcutaneous Tissue Debridement ([id number]) debridement with total area of 1.4 sq cm performed by [name] Caul, MD. with the following instrument(s): Curette to remove Non-Viable tissue/material including Fibrin/Slough and Subcutaneous after achieving pain control using Lidocaine 4% Topical Solution. A time out was conducted prior to the start of the procedure. A Minimum amount of bleeding was controlled with Pressure. The procedure was tolerated well with a pain level of 0 throughout and a pain level of 0 following the procedure. Post procedure Diagnosis Wound #6: Same as Pre-Procedure Plan Wound Cleansing: Wound #6 Left,Anterior Lower Leg: Clean wound with Normal Saline. Anesthetic: Wound #6 Left,Anterior Lower Leg: Topical Lidocaine 4% cream applied to wound bed prior to debridement Skin Barriers/Peri-Wound Care: Wound #6 Left,Anterior Lower Leg: Barrier cream Primary Wound Dressing: Wound #6 Left,Anterior Lower Leg: [name] [name], [name] [name] ([id number]) Aquacel Ag Secondary Dressing: Wound #6 Left,Anterior Lower Leg: Gauze and Kerlix/Conform Dressing Change Frequency: Wound #6 Left,Anterior Lower Leg: Change dressing every other day. Follow-up Appointments: Wound #6 Left,Anterior Lower Leg: Return Appointment in 1 week. Edema Control: Wound #6 Left,Anterior Lower Leg: 2 Layer Compression System - Left Lower Extremity Off-Loading: Wound #6 Left,Anterior Lower Leg: Turn and reposition every 2 hours - Stay off foot and try to keep boot off as long as possible. #1 we applied silver alginate to the wound, Kerlix Coban wrap. This was successful in getting the wound to close last time #2 at some point I'd like to see the stockings that supposedly cause this although the patient has not been wearing them in perhaps a week now. Electronic Signature(s) Signed: 06/13/2016 4:37:54 PM By:  [name] Najjar MD Entered By: [name] [name] on 06/13/2016 16:36:08 [name] [name] ([id number]) -------------------------------------------------------------------------------- ROS/PFSH Details Patient Name: [name] [name] Date of Service: 06/13/2016 2:15 PM Medical Record Patient Account Number: [id number] Number: Treating RN: [name] Mealy, RN, BSN, Rita 28-Sep-1956 [phone] y.o. Other Clinician: Date of Birth/Sex: Male) Treating ROBSON, [name] Primary Care Physician: [name] [name], [name] Physician/Extender: G Referring Physician: FITZGERALD, [name] Weeks in Treatment: 0 Information Obtained From Patient Wound History Do you currently have one or more open woundso Yes How many open wounds do you currently haveo 1 Approximately how long have you had your woundso  2mos How have you been treating your wound(s) until nowo dry dressing Has your wound(s) ever healed and then re-openedo Yes Have you had any lab work done in the past montho No Have you tested positive for an antibiotic resistant organism (MRSA, VRE)o No Have you tested positive for osteomyelitis (bone infection)o No Cardiovascular Complaints and Symptoms: Positive for: LE edema Medical History: Positive for: Arrhythmia - A-FIB; Congestive Heart Failure; Coronary Artery Disease; Hypertension; Myocardial Infarction Negative for: Angina; Deep Vein Thrombosis; Hypotension; Peripheral Arterial Disease; Peripheral Venous Disease; Phlebitis; Vasculitis Integumentary (Skin) Complaints and Symptoms: Positive for: Wounds; Breakdown Medical History: Negative for: History of Burn; History of pressure wounds Eyes Complaints and Symptoms: No Complaints or Symptoms Medical History: Positive for: Cataracts - both eyes Negative for: Glaucoma; Optic Neuritis [name] [name], [name] [name] ([id number]) Ear/Nose/Mouth/Throat Medical History: Negative for: Chronic sinus problems/congestion; Middle ear problems Hematologic/Lymphatic Complaints and  Symptoms: No Complaints or Symptoms Medical History: Positive for: Anemia Negative for: Hemophilia; Human Immunodeficiency Virus; Lymphedema; Sickle Cell Disease Respiratory Complaints and Symptoms: No Complaints or Symptoms Medical History: Negative for: Aspiration; Asthma; Chronic Obstructive Pulmonary Disease (COPD); Pneumothorax; Sleep Apnea; Tuberculosis Gastrointestinal Complaints and Symptoms: No Complaints or Symptoms Medical History: Negative for: Cirrhosis ; Colitis; Crohnos; Hepatitis A; Hepatitis B; Hepatitis C Endocrine Complaints and Symptoms: No Complaints or Symptoms Medical History: Positive for: Type II Diabetes Negative for: Type I Diabetes Time with diabetes: 20 years Treated with: Insulin Blood sugar tested every day: Yes Tested : bid Genitourinary Medical History: Positive for: End Stage Renal Disease Immunological Complaints and Symptoms: No Complaints or Symptoms [name] [name], [name] [name] ([id number]) Medical History: Negative for: Lupus Erythematosus; Raynaudos; Scleroderma Musculoskeletal Medical History: Positive for: Osteoarthritis Negative for: Gout; Rheumatoid Arthritis; Osteomyelitis Past Medical History Notes: arthritis Neurologic Complaints and Symptoms: No Complaints or Symptoms Medical History: Positive for: Neuropathy Negative for: Dementia; Quadriplegia; Paraplegia; Seizure Disorder Oncologic Complaints and Symptoms: No Complaints or Symptoms Medical History: Negative for: Received Chemotherapy; Received Radiation Psychiatric Complaints and Symptoms: No Complaints or Symptoms Medical History: Negative for: Anorexia/bulimia; Confinement Anxiety HBO Extended History Items Eyes: Cataracts Family and Social History Cancer: Yes - Father, Siblings; Diabetes: Yes - Father, Siblings; Heart Disease: Yes - Father; Hereditary Spherocytosis: No; Hypertension: Yes - Father; Kidney Disease: Yes - Siblings; Lung Disease: Yes - Father; Seizures:  No; Stroke: Yes - Mother; Thyroid Problems: No; Tuberculosis: No; Never smoker; Marital Status - Single; Alcohol Use: Never; Drug Use: No History; Caffeine Use: Moderate; Financial Concerns: No; Food, Clothing or Shelter Needs: No; Support System Lacking: No; Transportation Concerns: No; Advanced Directives: Yes (Not Provided); Patient does not want information on Advanced Directives; Do not resuscitate: No; Living Will: Yes (Not Provided); Medical Power of Attorney: Yes (Not Provided) [name] [name], [name] [name] ([id number]) Electronic Signature(s) Signed: 06/13/2016 4:37:54 PM By: [name] Najjarobson, Michael MD Signed: 06/13/2016 4:58:21 PM By: [name] EricAfful, Rita BSN, RN Entered By: [name] EricAfful, Rita on 06/13/2016 14:07:28 [name] [name], [name] [name] ([id number]) -------------------------------------------------------------------------------- SuperBill Details Patient Name: [name] [name], [name] [name] Date of Service: 06/13/2016 Medical Record Patient Account Number: [id number] Number: Treating RN: [name] MealyAfful, RN, BSN, Rita 12/12/1956 [phone](59 y.o. 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

## 2016-06-19 ENCOUNTER — Encounter: Payer: Medicare Other | Admitting: Internal Medicine

## 2016-06-19 DIAGNOSIS — L97321 Non-pressure chronic ulcer of left ankle limited to breakdown of skin: Secondary | ICD-10-CM | POA: Diagnosis not present

## 2016-06-20 NOTE — Progress Notes (Signed)
[name] [name], Jeremy ([id number]) Visit Report for 06/19/2016 Chief Complaint Document Details Patient Name: [name] [name], [name] [name] Date of Service: 06/19/2016 3:00 PM Medical Record Patient Account Number: [id number] Number: Treating RN: Phillis Haggisinkerton, Debi 08/27/1956 (60 y.o. Other Clinician: Date of Birth/Sex: Male) Treating Renelda Kilian Primary Care Physician: Sampson GoonFITZGERALD, [name] Physician/Extender: G Referring Physician: FITZGERALD, [name] Weeks in Treatment: 0 Information Obtained from: Patient Chief Complaint Right second toe wound and left lateral malleolar wound. is doing fine and has no fresh issues. He does have his appointment with the vascular surgeons. However he does not have an appointment with the orthopedic surgeons yet. he did not come for 2 weeks because of the bad weather. He has no fresh issues. 04/10/16; the patient returns today with a wound over the last 2-3 weeks on the anterior part of his left ankle. Electronic Signature(s) Signed: 06/20/2016 12:38:30 PM By: Baltazar Najjarobson, Sofie Schendel MD Entered By: Baltazar Najjarobson, Micole Delehanty on 06/19/2016 16:14:22 [name] [name], Nial ([id number]) -------------------------------------------------------------------------------- Debridement Details Patient Name: [name] [name], [name] [name] Date of Service: 06/19/2016 3:00 PM Medical Record Patient Account Number: [id number] Number: Treating RN: Phillis Haggisinkerton, Debi 09/17/1956 (60 y.o. Other Clinician: Date of Birth/Sex: Male) Treating Ogle Hoeffner Primary Care Physician: Sampson GoonFITZGERALD, [name] Physician/Extender: G Referring Physician: FITZGERALD, [name] Weeks in Treatment: 0 Debridement Performed for Wound #6 Left,Anterior Lower Leg Assessment: Performed By: Physician Maxwell CaulOBSON, Karren Newland G, MD Debridement: Debridement Pre-procedure Yes Verification/Time Out Taken: Start Time: 16:04 Pain Control: Other : lidocaine 4% cream Level: Skin/Subcutaneous Tissue Total Area Debrided (L x 0.8 (cm) x 0.2 (cm) = 0.16  (cm) W): Tissue and other Viable, Non-Viable, Exudate, Fibrin/Slough, Subcutaneous material debrided: Instrument: Forceps, Scissors Bleeding: Minimum Hemostasis Achieved: Pressure End Time: 16:07 Procedural Pain: 0 Post Procedural Pain: 0 Response to Treatment: Procedure was tolerated well Post Debridement Measurements of Total Wound Length: (cm) 0.8 Width: (cm) 0.2 Depth: (cm) 0.2 Volume: (cm) 0.025 Post Procedure Diagnosis Same as Pre-procedure Electronic Signature(s) Signed: 06/19/2016 5:01:48 PM By: Alejandro MullingPinkerton, Debra Signed: 06/20/2016 12:38:30 PM By: Baltazar Najjarobson, Deloyd Handy MD Entered By: Baltazar Najjarobson, Gracin Soohoo on 06/19/2016 16:13:40 [name] [name], Donyae ([id number]) -------------------------------------------------------------------------------- Debridement Details Patient Name: [name] [name], [name] [name] Date of Service: 06/19/2016 3:00 PM Medical Record Patient Account Number: [id number] Number: Treating RN: Phillis Haggisinkerton, Debi 07/13/1956 (60 y.o. Other Clinician: Date of Birth/Sex: Male) Treating Azarias Chiou Primary Care Physician: Sampson GoonFITZGERALD, [name] Physician/Extender: G Referring Physician: FITZGERALD, [name] Weeks in Treatment: 0 Debridement Performed for Wound #7 Right,Anterior Lower Leg Assessment: Performed By: Physician Maxwell CaulOBSON, Jedrek Dinovo G, MD Debridement: Debridement Pre-procedure Yes Verification/Time Out Taken: Start Time: 16:02 Pain Control: Other : lidocaine 4% cream Level: Skin/Subcutaneous Tissue Total Area Debrided (L x 1.6 (cm) x 1 (cm) = 1.6 (cm) W): Tissue and other Viable, Non-Viable, Exudate, Fibrin/Slough, Subcutaneous material debrided: Instrument: Forceps, Scissors Bleeding: Minimum Hemostasis Achieved: Pressure End Time: 16:03 Procedural Pain: 0 Post Procedural Pain: 0 Response to Treatment: Procedure was tolerated well Post Debridement Measurements of Total Wound Length: (cm) 2 Width: (cm) 1.1 Depth: (cm) 0.1 Volume: (cm) 0.173 Post Procedure  Diagnosis Same as Pre-procedure Electronic Signature(s) Signed: 06/19/2016 5:01:48 PM By: Alejandro MullingPinkerton, Debra Signed: 06/20/2016 12:38:30 PM By: Baltazar Najjarobson, Jakhia Buxton MD Entered By: Baltazar Najjarobson, Jevonte Clanton on 06/19/2016 16:13:50 [name] [name], Kenley ([id number]) -------------------------------------------------------------------------------- HPI Details Patient Name: [name] [name], [name] [name] Date of Service: 06/19/2016 3:00 PM Medical Record Patient Account Number: [id number] Number: Treating RN: Phillis Haggisinkerton, Debi 10/09/1956 (60 y.o. Other Clinician: Date of Birth/Sex: Male) Treating Derika Eckles Primary Care Physician: Sampson GoonFITZGERALD, [name] Physician/Extender: G Referring Physician: FITZGERALD, [name] Weeks in Treatment: 0 History of Present Illness Location: left fourth toe  Quality: Patient reports experiencing a dull pain to affected area(s). Severity: Patient states wound (s) are getting better. Duration: Patient states that they are not certain how long the wound has been present. Timing: Pain in wound is constant (hurts all the time) Context: The wound appeared gradually over time Associated Signs and Symptoms: nil HPI Description: has been doing fine and has no fresh issues. He does say that he is trying to get all his appointments in order and at present he has a appointment with the vascular surgeons. While at home he removes his walking shoe and keeps the leg and foot open. There is no pain and minimal drainage. 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 He tells me that over the last 2-3 weeks he has noticed pain and a wound on the crease of his left dorsal ankle and he is here for our review events. Because of the deformity in his ankle he has a  fracture boot that he uses the walker. [facility] [place] he was in hospital in January and suffered a wide complex cardiac arrest at the time of the fistulagram. He has recovered from this. 04/17/16 a his left dorsal ankle appears to be improved. He has an appointment with [facility] to adjust his boot next week. He does not appear to have significant PAD but does have significant venous insufficiencywent 04/24/16; went to [facility]otech last week,did not adjust his boot due to edema. She has called his cardiologist and in the meantime they have apparently taken some extra fluid off him at dialysis. He dialyzes Monday Wednesday and Friday 05/01/16; the patient arrived with some scant surface eschar over his dorsal ankle wound. This was debridement. There is nothing but normal skin under this. His leg seems to have done well with a 2 layer compression we put on this last week accompanied by extra fluid removal at dialysis's resulted in less edema of his leg. He has chronic venous insufficiency with resultant edema been eating his chronic renal failure and deformity of his left ankle. 06/13/16; the patient arrives today with a recurrent wound in the same spot as last time. He has chronic venous insufficiency also deformity of the foot and ankle likely secondary to Charcot deformity. The patient has type 2 diabetes with polyneuropathy. He states the stockings he was wearing were too tight and he feels that's why it rubbed his ankle. He also has a Systems developerCharcot boot, that was adjusted last time. [name] [name], Athony ([id number]) 06/19/16; Nelda SevereUrias today with a new wound on the right anterior leg. The original wound on the crease of his dorsal left ankle in the setting of chronic venous insufficiency and a Charcot deformity in this area is roughly the same. Electronic Signature(s) Signed: 06/20/2016 12:38:30 PM By: Baltazar Najjarobson, Kiylah Loyer MD Entered By: Baltazar Najjarobson, Tayshaun Kroh on  06/19/2016 16:15:27 [name] [name], Shakir ([id number]) -------------------------------------------------------------------------------- Physical Exam Details Patient Name: [name] [name], Quillan Date of Service: 06/19/2016 3:00 PM Medical Record Patient Account Number: [id number] Number: Treating RN: Phillis Haggisinkerton, Debi 04/02/1956 (59 y.o. Other Clinician: Date of Birth/Sex: Male) Treating Harald Quevedo Primary Care Physician: Sampson GoonFITZGERALD, [name] Physician/Extender: G Referring Physician: FITZGERALD, [name] Weeks in Treatment: 0 Notes  Wound exam; he has a wound on the left dorsal ankle. Nonviable subcutaneous tissue debridement. The ankle is inverted secondary to Charcot deformity. There is no evidence of infection he's developed a skin tear on the right anterior leg. He is not really sure how that happened. Unfortunately this skin over the top of this is nonviable and had to be removed. Again there is no evidence of infection. This is likely minor trauma in the setting of chronic venous insufficiency. Electronic Signature(s) Signed: 06/20/2016 12:38:30 PM By: Baltazar Najjar MD Entered By: Baltazar Najjar on 06/19/2016 16:18:25 [name] [name] ([id number]) -------------------------------------------------------------------------------- Physician Orders Details Patient Name: [name] [name] Date of Service: 06/19/2016 3:00 PM Medical Record Patient Account Number: [id number] Number: Treating RN: Phillis Haggis 1956/12/10 (59 y.o. Other Clinician: Date of Birth/Sex: Male) Treating Klair Leising Primary Care Physician: [name], [name] Physician/Extender: G Referring Physician: FITZGERALD, [name] Weeks in Treatment: 0 Verbal / Phone Orders: Yes Clinician: Pinkerton, Debi Read Back and Verified: Yes Diagnosis Coding Wound Cleansing Wound #6 Left,Anterior Lower Leg o Clean wound with Normal Saline. Wound #7 Right,Anterior Lower Leg o Clean wound with Normal  Saline. Anesthetic Wound #6 Left,Anterior Lower Leg o Topical Lidocaine 4% cream applied to wound bed prior to debridement Wound #7 Right,Anterior Lower Leg o Topical Lidocaine 4% cream applied to wound bed prior to debridement Skin Barriers/Peri-Wound Care Wound #6 Left,Anterior Lower Leg o Barrier cream Wound #7 Right,Anterior Lower Leg o Skin Prep Primary Wound Dressing Wound #6 Left,Anterior Lower Leg o Aquacel Ag Wound #7 Right,Anterior Lower Leg o Prisma Ag Secondary Dressing Wound #6 Left,Anterior Lower Leg o ABD pad o Dry Gauze Wound #7 Right,Anterior Lower Leg Derner, Ramonte ([id number]) o Boardered Foam Dressing Dressing Change Frequency Wound #6 Left,Anterior Lower Leg o Change dressing every week Wound #7 Right,Anterior Lower Leg o Change dressing every week Follow-up Appointments Wound #6 Left,Anterior Lower Leg o Return Appointment in 1 week. Wound #7 Right,Anterior Lower Leg o Return Appointment in 1 week. Edema Control Wound #6 Left,Anterior Lower Leg o 2 Layer Compression System - Left Lower Extremity Off-Loading Wound #6 Left,Anterior Lower Leg o Turn and reposition every 2 hours - Stay off foot and try to keep boot off as long as possible. Electronic Signature(s) Signed: 06/19/2016 5:01:48 PM By: Alejandro Mulling Signed: 06/20/2016 12:38:30 PM By: Baltazar Najjar MD Entered By: Alejandro Mulling on 06/19/2016 16:11:48 [name] [name] ([id number]) -------------------------------------------------------------------------------- Problem List Details Patient Name: [name] [name] Date of Service: 06/19/2016 3:00 PM Medical Record Patient Account Number: [id number] Number: Treating RN: Phillis Haggis 12-02-1956 (59 y.o. 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 limited to 06/13/2016 Yes breakdown of skin E11.42 Type 2 diabetes mellitus with diabetic polyneuropathy 06/13/2016 Yes M14.672 Charcot's joint, left ankle and foot 06/13/2016 Yes L97.211 Non-pressure chronic ulcer of right calf limited to 06/19/2016 Yes breakdown of skin Inactive Problems Resolved Problems Electronic Signature(s) Signed: 06/20/2016 12:38:30 PM By: Baltazar Najjar MD Entered By: Baltazar Najjar on 06/19/2016 16:09:50 [name] [name] ([id number]) -------------------------------------------------------------------------------- Progress Note Details Patient Name: [name] [name] Date of Service: 06/19/2016 3:00 PM Medical Record Patient Account Number: [id number] Number: Treating RN: Phillis Haggis 05-16-1956 (59 y.o. Other Clinician: Date of Birth/Sex: Male) Treating Sheretta Grumbine Primary Care Physician: [name], [name] Physician/Extender: G Referring Physician: FITZGERALD, [name] Weeks in Treatment: 0 Subjective Chief Complaint Information  obtained from Patient Right second toe wound and left lateral malleolar wound. is doing fine and has no fresh issues. He does have his appointment with the vascular surgeons. However he does not have an appointment with the orthopedic surgeons yet. he did not come for 2 weeks because of the bad weather. He has no fresh issues. 04/10/16; the patient returns today with a wound over the last 2-3 weeks on the anterior part of his left ankle. History of Present Illness (HPI) The following HPI elements were documented for the patient's wound: Location: left fourth toe Quality: Patient reports experiencing a dull pain to affected area(s). Severity: Patient states wound (s) are getting better. Duration: Patient states that they are not certain how long the wound has been present. Timing: Pain in wound is constant  (hurts all the time) Context: The wound appeared gradually over time Associated Signs and Symptoms: nil has been doing fine and has no fresh issues. He does say that he is trying to get all his appointments in order and at present he has a appointment with the vascular surgeons. While at home he removes his walking shoe and keeps the leg and foot open. There is no pain and minimal drainage. 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 He tells me that over the last 2-3 weeks he has noticed pain and a wound on the crease of his left dorsal ankle and he is here for our review events. Because of the deformity in his ankle he has a fracture boot that he uses the walker. This was [facility] at [facility]. [facility] he was in hospital in January and suffered a wide complex cardiac arrest at the time of the fistulagram. He has recovered from this. 04/17/16 a his left dorsal ankle appears to be improved. He has an appointment with biotech to adjust his boot next week. He does not appear to have significant PAD but does have significant venous insufficiencywent 04/24/16; went to biotech last week,did not adjust his boot due to edema. She has called his cardiologist and in the meantime they have apparently taken some extra fluid off him at dialysis. He dialyzes Monday Wednesday and Friday CHANCELOR, HARDRICK ([id number]) 05/01/16; the patient arrived with some scant surface eschar over his dorsal ankle wound. This was debridement. There is nothing but normal skin under this. His leg seems to have done well with a 2 layer compression we put on this last week accompanied by extra fluid removal at dialysis's resulted in  less edema of his leg. He has chronic venous insufficiency with resultant edema been eating his chronic renal failure and deformity of his left ankle. 06/13/16; the patient arrives today with a recurrent wound in the same spot as last time. He has chronic venous insufficiency also deformity of the foot and ankle likely secondary to Charcot deformity. The patient has type 2 diabetes with polyneuropathy. He states the stockings he was wearing were too tight and he feels that's why it rubbed his ankle. He also has a Systems developer, that was adjusted last time. 6/27/17Nelda Severe today with a new wound on the right anterior leg. The original wound on the crease of  his dorsal left ankle in the setting of chronic venous insufficiency and a Charcot deformity in this area is roughly the same. Objective Constitutional Vitals Time Taken: 3:32 PM, Height: 72 in, Temperature: 98.1 F, Pulse: 87 bpm, Respiratory Rate: 18 breaths/min, Blood Pressure: 110/66 mmHg. Integumentary (Hair, Skin) Wound #6 status is Open. Original cause of wound was Gradually Appeared. The wound is located on the Left,Anterior Lower Leg. The wound measures 0.8cm length x 2cm width x 0.2cm depth; 1.257cm^2 area and 0.251cm^3 volume. The wound is limited to skin breakdown. There is no tunneling noted. There is a large amount of serosanguineous drainage noted. The wound margin is distinct with the outline attached to the wound base. There is large (67-100%) pink, pale granulation within the wound bed. There is a small (1-33%) amount of necrotic tissue within the wound bed including Adherent Slough. The periwound skin appearance exhibited: Moist. The periwound skin appearance did not exhibit: Callus, Crepitus, Excoriation, Fluctuance, Friable, Induration, Localized Edema, Rash, Scarring, Dry/Scaly, Maceration, Atrophie Blanche, Cyanosis, Ecchymosis, Hemosiderin Staining, Mottled, Pallor, Rubor, Erythema. Periwound temperature was noted as  No Abnormality. Wound #7 status is Open. Original cause of wound was Shear/Friction. The wound is located on the Right,Anterior Lower Leg. The wound measures 1.6cm length x 1cm width x 0.1cm depth; 1.257cm^2 area and 0.126cm^3 volume. The wound is limited to skin breakdown. There is no tunneling or undermining noted. There is a large amount of serosanguineous drainage noted. The wound margin is flat and intact. There is large (67-100%) red, pink granulation within the wound bed. There is no necrotic tissue within the wound bed. The periwound skin appearance exhibited: Moist. Periwound temperature was noted as No Abnormality. The periwound has tenderness on palpation. 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 There was a Skin/Subcutaneous Tissue Debridement ([id number]) debridement with total area of 0.16 sq cm performed by Maxwell Caul, MD. with the following instrument(s): Forceps and Scissors to remove Viable and Non-Viable tissue/material including Exudate, Fibrin/Slough, and Subcutaneous after achieving pain control using Other (lidocaine 4% cream). A time out was conducted prior to the start of the procedure. A Minimum amount of bleeding was controlled with Pressure. The procedure was tolerated well with a pain level of 0 throughout and a pain level of 0 following the procedure. Post Debridement Measurements: 0.8cm length x 0.2cm width x 0.2cm depth; 0.025cm^3 volume. Post procedure Diagnosis Wound #6: Same as Pre-Procedure Wound #7 Wound #7 is a Diabetic Wound/Ulcer of the Lower  Extremity located on the Right,Anterior Lower Leg . There was a Skin/Subcutaneous Tissue Debridement ([id number]) debridement with total area of 1.6 sq cm performed by Maxwell Caul, MD. with the following instrument(s): Forceps and Scissors to remove Viable and Non-Viable tissue/material including Exudate, Fibrin/Slough, and Subcutaneous after achieving pain control using Other (lidocaine 4% cream). A time out was conducted prior to the start of the procedure. A Minimum amount of bleeding was controlled with Pressure. The procedure was tolerated well with a pain level of 0 throughout and a pain level of 0 following the procedure. Post Debridement Measurements: 2cm length x 1.1cm width x 0.1cm depth; 0.173cm^3 volume. Post  procedure Diagnosis Wound #7: Same as Pre-Procedure Plan Wound Cleansing: Wound #6 Left,Anterior Lower Leg: Clean wound with Normal Saline. Shimabukuro, Montie ([id number]) Wound #7 Right,Anterior Lower Leg: Clean wound with Normal Saline. Anesthetic: Wound #6 Left,Anterior Lower Leg: Topical Lidocaine 4% cream applied to wound bed prior to debridement Wound #7 Right,Anterior Lower Leg: Topical Lidocaine 4% cream applied to wound bed prior to debridement Skin Barriers/Peri-Wound Care: Wound #6 Left,Anterior Lower Leg: Barrier cream Wound #7 Right,Anterior Lower Leg: Skin Prep Primary Wound Dressing: Wound #6 Left,Anterior Lower Leg: Aquacel Ag Wound #7 Right,Anterior Lower Leg: Prisma Ag Secondary Dressing: Wound #6 Left,Anterior Lower Leg: ABD pad Dry Gauze Wound #7 Right,Anterior Lower Leg: Boardered Foam Dressing Dressing Change Frequency: Wound #6 Left,Anterior Lower Leg: Change dressing every week Wound #7 Right,Anterior Lower Leg: Change dressing every week Follow-up Appointments: Wound #6 Left,Anterior Lower Leg: Return Appointment in 1 week. Wound #7 Right,Anterior Lower Leg: Return Appointment in 1 week. Edema Control: Wound #6 Left,Anterior  Lower Leg: 2 Layer Compression System - Left Lower Extremity Off-Loading: Wound #6 Left,Anterior Lower Leg: Turn and reposition every 2 hours - Stay off foot and try to keep boot off as long as possible. Continue with Aquacel AG to the wound on the dorsal left ankle,kerlix and coban prisma with foam dressing to the anterior right leg wound. under his own pressure stoking. LEVELL, TAVANO ([id number]) Electronic Signature(s) Signed: 06/20/2016 12:38:30 PM By: Baltazar Najjar MD Entered By: Baltazar Najjar on 06/19/2016 16:21:00 [name] [name] ([id number]) -------------------------------------------------------------------------------- SuperBill Details Patient Name: [name] [name] Date of Service: 06/19/2016 Medical Record Patient Account Number: [id number] Number: Treating RN: Phillis Haggis Dec 20, 1956 (59 y.o. 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 to breakdown of skin E11.42 Type 2 diabetes mellitus with diabetic polyneuropathy M14.672 Charcot's joint, left ankle and foot L97.211 Non-pressure chronic ulcer of right calf limited to breakdown of skin Facility Procedures CPT4 Code Description: [id number] 11042 - DEB SUBQ TISSUE 20 SQ CM/< ICD-10 Description Diagnosis L97.321 Non-pressure chronic ulcer of left ankle limited to L97.211 Non-pressure chronic ulcer of right calf limited to Modifier: breakdown of breakdown of Quantity: 1 skin skin Physician Procedures CPT4 Code Description: [id number] 11042 - WC PHYS SUBQ TISS 20 SQ CM ICD-10 Description Diagnosis L97.321 Non-pressure chronic ulcer of left ankle limited to L97.211 Non-pressure chronic ulcer of right calf limited to Modifier: breakdown of breakdown of Quantity: 1 skin  skin Electronic Signature(s) Signed: 06/20/2016 12:38:30 PM By: Baltazar Najjar MD Entered By: Baltazar Najjar on 06/19/2016 16:21:36

## 2016-06-20 NOTE — Progress Notes (Signed)
[name] [name], [name] [name] ([id number]) Visit Report for 06/19/2016 Arrival Information Details Patient Name: [name] [name], [name] [name] Date of Service: 06/19/2016 3:00 PM Medical Record Number: [id number] Patient Account Number: [id number] Date of Birth/Sex: 05/14/1956 1(59 y.o. Male) Treating RN: Phillis HaggisPinkerton, Debi Primary Care Physician: FITZGERALD, [name] Other Clinician: Referring Physician: FITZGERALD, [name] Treating Physician/Extender: Maxwell CaulOBSON, MICHAEL G Weeks in Treatment: 0 Visit Information History Since Last Visit All ordered tests and consults were completed: No Patient Arrived: Wheel Chair Added or deleted any medications: No Arrival Time: 15:30 Any new allergies or adverse reactions: No Accompanied By: self Had a fall or experienced change in No Transfer Assistance: EasyP[employer] activities of daily living that may affect Patient Lift risk of falls: Patient Identification Verified: Yes Signs or symptoms of abuse/neglect since last No Secondary Verification Process Yes visito Completed: Hospitalized since last visit: No Patient Requires Transmission- No Pain Present Now: Yes Based Precautions: Patient Has Alerts: No Electronic Signature(s) Signed: 06/19/2016 5:01:48 PM By: Alejandro MullingPinkerton, Debra Entered By: Alejandro MullingPinkerton, Debra on 06/19/2016 15:31:04 [name] [name], Aadarsh ([id number]) -------------------------------------------------------------------------------- Encounter Discharge Information Details Patient Name: [name] [name], [name] [name] Date of Service: 06/19/2016 3:00 PM Medical Record Number: [id number] Patient Account Number: [id number] Date of Birth/Sex: 11/19/1956 [age] y.o. Male) Treating RN: Phillis HaggisPinkerton, Debi Primary Care Physician: FITZGERALD, [name] Other Clinician: Referring Physician: FITZGERALD, [name] Treating Physician/Extender: Altamese CarolinaOBSON, MICHAEL G Weeks in Treatment: 0 Encounter Discharge Information Items Discharge Pain Level: 0 Discharge Condition: Stable Ambulatory Status: Wheelchair Discharge Destination:  Home Transportation: Private Auto Accompanied By: friend Schedule Follow-up Appointment: Yes Medication Reconciliation completed and provided to Patient/Care Yes Divante Kotch: Provided on Clinical Summary of Care: 06/19/2016 Form Type Recipient Paper Patient TA Electronic Signature(s) Signed: 06/19/2016 4:27:45 PM By: Gwenlyn PerkingMoore, Shelia Entered By: Gwenlyn PerkingMoore, Shelia on 06/19/2016 16:27:45 [name] [name], [name] [name] ([id number]) -------------------------------------------------------------------------------- Lower Extremity Assessment Details Patient Name: [name] [name], [name] [name] Date of Service: 06/19/2016 3:00 PM Medical Record Number: [id number] Patient Account Number: [id number] Date of Birth/Sex: 09/08/1956 [age] y.o. Male) Treating RN: Phillis HaggisPinkerton, Debi Primary Care Physician: FITZGERALD, [name] Other Clinician: Referring Physician: FITZGERALD, [name] Treating Physician/Extender: Maxwell CaulOBSON, MICHAEL G Weeks in Treatment: 0 Vascular Assessment Pulses: Posterior Tibial Dorsalis Pedis Palpable: [Left:No] [Right:No] Doppler: [Left:Monophasic] [Right:Monophasic] Extremity colors, hair growth, and conditions: Extremity Color: [Left:Hyperpigmented] [Right:Hyperpigmented] Temperature of Extremity: [Left:Warm] [Right:Warm] Capillary Refill: [Left:< 3 seconds] [Right:< 3 seconds] Electronic Signature(s) Signed: 06/19/2016 5:01:48 PM By: Alejandro MullingPinkerton, Debra Entered By: Alejandro MullingPinkerton, Debra on 06/19/2016 15:33:04 [name] [name], [name] [name] ([id number]) -------------------------------------------------------------------------------- Multi Wound Chart Details Patient Name: [name] [name], [name] [name] Date of Service: 06/19/2016 3:00 PM Medical Record Number: [id number] Patient Account Number: [id number] Date of Birth/Sex: 05/10/1956 [age] y.o. 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] [7:the Lower Extremity] Comorbid History: [6:Cataracts, Anemia, Arrhythmia, Congestive Arrhythmia, Congestive Heart Failure, Coronary Heart Failure, Coronary Artery Disease, Hypertension, Myocardial Hypertension, Myocardial Infarction, Type II Diabetes, End Stage Renal Disease,  Osteoarthritis, Neuropathy Osteoarthritis, Neuropathy] [7:Cataracts, Anemia, Artery Disease, Infarction, Type II Diabetes, End Stage Renal Disease,] [N/A:N/A] Date Acquired: [6:05/16/2016] [7:06/12/2016] [N/A:N/A] Weeks of Treatment: [6:0] [7:0] [N/A:N/A] Wound Status: [6:Open] [7:Open] [N/A:N/A] Measurements L x W x D 0.8x2x0.2 [7:1.6x1x0.1] [N/A:N/A] (cm) Area (cm) : [6:1.257] [7:1.257] [N/A:N/A]  Volume (cm) : [6:0.251] [7:0.126] [N/A:N/A] % Reduction in Area: [6:-14.30%] [7:N/A] [N/A:N/A] % Reduction in Volume: 88.60% [7:N/A] [N/A:N/A] Classification: [6:Grade 1] [7:Grade 1] [N/A:N/A] Exudate Amount: [6:Large] [7:Large] [N/A:N/A] Exudate Type: [6:Serosanguineous] [7:Serosanguineous] [N/A:N/A] Exudate Color: [6:red, brown] [7:red, brown] [N/A:N/A] Wound Margin: [6:Distinct, outline attached Flat and Intact] [N/A:N/A] Granulation Amount: [6:Large (67-100%)] [7:Large (67-100%)] [N/A:N/A] Granulation Quality: [6:Pink, Pale] [7:Red, Pink] [N/A:N/A] Necrotic Amount: Small (1-33%) None Present (0%) N/A Exposed Structures: Fascia: No Fascia: No N/A Fat: No Fat: No Tendon: No Tendon: No Muscle: No Muscle: No Joint: No Joint: No Bone: No Bone: No Limited to Skin Limited to  Skin Breakdown Breakdown Epithelialization: None None N/A Periwound Skin Texture: Edema: No No Abnormalities Noted N/A Excoriation: No Induration: No Callus: No Crepitus: No Fluctuance: No Friable: No Rash: No Scarring: No Periwound Skin Moist: Yes Moist: Yes N/A Moisture: Maceration: No Dry/Scaly: No Periwound Skin Color: Atrophie Blanche: No No Abnormalities Noted N/A Cyanosis: No Ecchymosis: No Erythema: No Hemosiderin Staining: No Mottled: No Pallor: No Rubor: No Temperature: No Abnormality No Abnormality N/A Tenderness on No Yes N/A Palpation: Wound Preparation: Ulcer Cleansing: Ulcer Cleansing: N/A Rinsed/Irrigated with Rinsed/Irrigated with Saline Saline Topical Anesthetic Topical Anesthetic Applied: Other: lidocaine Applied: Other: lidocaine 4% 4% Treatment Notes Electronic Signature(s) Signed: 06/19/2016 5:01:48 PM By: Alejandro MullingPinkerton, Debra Entered By: Alejandro MullingPinkerton, Debra on 06/19/2016 15:55:02 [name] [name], Keisean ([id number]) -------------------------------------------------------------------------------- Multi-Disciplinary Care Plan Details Patient Name: [name] [name], Alasdair Date of Service: 06/19/2016 3:00 PM Medical Record Number: [id number] Patient Account Number: [id number] Date of Birth/Sex: 08/07/1956 [age] y.o. Male) Treating RN: Phillis HaggisPinkerton, Debi Primary Care Physician: FITZGERALD, [name] Other Clinician: Referring Physician: FITZGERALD, [name] Treating Physician/Extender: Altamese CarolinaOBSON, MICHAEL G Weeks in Treatment: 0 Active Inactive Orientation to the Wound Care Program Nursing Diagnoses: Knowledge deficit related to the wound healing center program Goals: Patient/caregiver will verbalize understanding of the Wound Healing Center Program Date Initiated: 06/13/2016 Goal Status: Active Interventions: Provide education on orientation to the wound center Notes: Wound/Skin Impairment Nursing Diagnoses: Impaired tissue integrity Knowledge deficit related to  ulceration/compromised skin integrity Goals: Patient/caregiver will verbalize understanding of skin care regimen Date Initiated: 06/13/2016 Goal Status: Active Ulcer/skin breakdown will have a volume reduction of 30% by week 4 Date Initiated: 06/13/2016 Goal Status: Active Ulcer/skin breakdown will have a volume reduction of 50% by week 8 Date Initiated: 06/13/2016 Goal Status: Active Ulcer/skin breakdown will have a volume reduction of 80% by week 12 Date Initiated: 06/13/2016 Goal Status: Active Ulcer/skin breakdown will heal within 14 weeks Date Initiated: 06/13/2016 [name] [name], [name] [name] ([id number]) Goal Status: Active Interventions: Assess patient/caregiver ability to obtain necessary supplies Assess patient/caregiver ability to perform ulcer/skin care regimen upon admission and as needed Provide education on ulcer and skin care Notes: Electronic Signature(s) Signed: 06/19/2016 5:01:48 PM By: Alejandro MullingPinkerton, Debra Entered By: Alejandro MullingPinkerton, Debra on 06/19/2016 15:54:57 [name] [name], [name] [name] ([id number]) -------------------------------------------------------------------------------- Pain Assessment Details Patient Name: [name] [name], Fields Date of Service: 06/19/2016 3:00 PM Medical Record Number: [id number] Patient Account Number: [id number] Date of Birth/Sex: 08/29/1956 [age] y.o. Male) Treating RN: Phillis HaggisPinkerton, Debi Primary Care Physician: FITZGERALD, [name] Other Clinician: Referring Physician: FITZGERALD, [name] Treating Physician/Extender: Maxwell CaulOBSON, MICHAEL G Weeks in Treatment: 0 Active Problems Location of Pain Severity and Description of Pain Patient Has Paino Yes Site Locations Pain Location: Pain in Ulcers With Dressing Change: Yes Duration of the Pain. Constant / Intermittento Constant Rate the pain. Current Pain Level: 6 Worst Pain Level: 3 Least Pain Level: 8 Character of Pain Describe the Pain: Aching, Tender, Throbbing Pain Management and Medication Current Pain Management: Electronic  Signature(s) Signed: 06/19/2016 5:01:48 PM By: Alejandro Mulling Entered By: Alejandro Mulling on 06/19/2016 15:30:53 [name] Mura ([id number]) -------------------------------------------------------------------------------- Patient/Caregiver Education Details Patient Name: [name] Mura Date of Service: 06/19/2016 3:00 PM Medical Record Number: [id number] Patient Account Number: [id number] Date of Birth/Gender: April 21, 1956 (60 y.o. Male) Treating RN: Phillis Haggis Primary Care Physician: FITZGERALD, [name] Other Clinician: Referring Physician: FITZGERALD, [name] Treating Physician/Extender: Altamese [place] in Treatment: 0 Education Assessment Education Provided To: Patient Education Topics Provided Wound/Skin Impairment: Handouts: Other: do not get wrap wet Methods: Demonstration, Explain/Verbal Responses: State content correctly Electronic Signature(s) Signed: 06/19/2016 5:01:48 PM By: Alejandro Mulling Entered By: Alejandro Mulling on 06/19/2016 16:11:01 [name] Mura ([id number]) -------------------------------------------------------------------------------- Wound Assessment Details Patient Name: [name] Mura Date of Service: 06/19/2016 3:00 PM Medical Record Number: [id number] Patient Account Number: [id number] Date of Birth/Sex: 1956-09-25 (60 y.o. Male) Treating RN: Phillis Haggis Primary Care Physician: FITZGERALD, [name] Other Clinician: Referring Physician: FITZGERALD, [name] Treating Physician/Extender: Maxwell Caul Weeks in Treatment: 0 Wound Status Wound Number: 6 Primary Diabetic Wound/Ulcer of the Lower Etiology: Extremity Wound Location: Left Lower Leg - Anterior Wound Open Wounding Event: Gradually Appeared Status: Date Acquired: 05/16/2016 Comorbid Cataracts, Anemia, Arrhythmia, Weeks Of Treatment: 0 History: Congestive Heart Failure, Coronary Clustered Wound: No Artery Disease, Hypertension, Myocardial Infarction, Type II Diabetes, End Stage Renal  Disease, Osteoarthritis, Neuropathy Photos Photo Uploaded By: Alejandro Mulling on 06/19/2016 17:04:38 Wound Measurements Length: (cm) 0.8 Width: (cm) 2 Depth: (cm) 0.2 Area: (cm) 1.257 Volume: (cm) 0.251 % Reduction in Area: -14.3% % Reduction in Volume: 88.6% Epithelialization: None Tunneling: No Wound Description Classification: Grade 1 Wound Margin: Distinct, outline attached Exudate Amount: Large Exudate Type: Serosanguineous Exudate Color: red, brown Foul Odor After Cleansing: No Wound Bed Granulation Amount: Large (67-100%) Exposed Structure Lafave, Panfilo ([id number]) Granulation Quality: Pink, Pale Fascia Exposed: No Necrotic Amount: Small (1-33%) Fat Layer Exposed: No Necrotic Quality: Adherent Slough Tendon Exposed: No Muscle Exposed: No Joint Exposed: No Bone Exposed: No Limited to Skin Breakdown Periwound Skin Texture Texture Color No Abnormalities Noted: No No Abnormalities Noted: No Callus: No Atrophie Blanche: No Crepitus: No Cyanosis: No Excoriation: No Ecchymosis: No Fluctuance: No Erythema: No Friable: No Hemosiderin Staining: No Induration: No Mottled: No Localized Edema: No Pallor: No Rash: No Rubor: No Scarring: No Temperature / Pain Moisture Temperature: No Abnormality No Abnormalities Noted: No Dry / Scaly: No Maceration: No Moist: Yes Wound Preparation Ulcer Cleansing: Rinsed/Irrigated with Saline Topical Anesthetic Applied: Other: lidocaine 4%, Treatment Notes Wound #6 (Left, Anterior Lower Leg) 1. Cleansed with: Cleanse wound with antibacterial soap and water 2. Anesthetic Topical Lidocaine 4% cream to wound bed prior to debridement 3. Peri-wound Care: Barrier cream 4. Dressing Applied: Aquacel Ag 5. Secondary Dressing Applied ABD Pad Dry Gauze 7. Secured with Tape 2 Layer Compression System - Left Lower Extremity Torrico, Lark ([id number]) Electronic Signature(s) Signed: 06/19/2016 5:01:48 PM By: Alejandro Mulling Entered By: Alejandro Mulling on 06/19/2016 15:43:55 [name] Mura ([id number]) -------------------------------------------------------------------------------- Wound Assessment Details Patient Name: [name] Mura Date of Service: 06/19/2016 3:00 PM Medical Record Number: [id number] Patient Account Number: [id number] Date of Birth/Sex: Dec 18, 1956 (60 y.o. Male) Treating RN: Phillis Haggis Primary Care Physician: FITZGERALD, [name] Other Clinician: Referring Physician: FITZGERALD, [name] Treating Physician/Extender: Maxwell Caul Weeks in Treatment: 0 Wound Status Wound Number: 7 Primary Diabetic Wound/Ulcer of the Lower Etiology: Extremity Wound Location: Right Lower Leg - Anterior Wound Open Wounding Event: Shear/Friction Status: Date Acquired: 06/12/2016 Comorbid Cataracts, Anemia, Arrhythmia, Weeks Of Treatment: 0 History: Congestive Heart Failure, Coronary  Clustered Wound: No Artery Disease, Hypertension, Myocardial Infarction, Type II Diabetes, End Stage Renal Disease, Osteoarthritis, Neuropathy Photos Photo Uploaded By: Alejandro Mulling on 06/19/2016 17:04:38 Wound Measurements Length: (cm) 1.6 Width: (cm) 1 Depth: (cm) 0.1 Area: (cm) 1.257 Volume: (cm) 0.126 % Reduction in Area: % Reduction in Volume: Epithelialization: None Tunneling: No Undermining: No Wound Description Classification: Grade 1 Wound Margin: Flat and Intact Exudate Amount: Large Exudate Type: Serosanguineous Exudate Color: red, brown Wound Bed Granulation Amount: Large (67-100%) Exposed Structure Raymundo, Traci ([id number]) Granulation Quality: Red, Pink Fascia Exposed: No Necrotic Amount: None Present (0%) Fat Layer Exposed: No Tendon Exposed: No Muscle Exposed: No Joint Exposed: No Bone Exposed: No Limited to Skin Breakdown Periwound Skin Texture Texture Color No Abnormalities Noted: No No Abnormalities Noted: No Moisture Temperature / Pain No Abnormalities Noted:  No Temperature: No Abnormality Moist: Yes Tenderness on Palpation: Yes Wound Preparation Ulcer Cleansing: Rinsed/Irrigated with Saline Topical Anesthetic Applied: Other: lidocaine 4%, Treatment Notes Wound #7 (Right, Anterior Lower Leg) 1. Cleansed with: Clean wound with Normal Saline 2. Anesthetic Topical Lidocaine 4% cream to wound bed prior to debridement 3. Peri-wound Care: Skin Prep 4. Dressing Applied: Prisma Ag 5. Secondary Dressing Applied Bordered Foam Dressing Electronic Signature(s) Signed: 06/19/2016 5:01:48 PM By: Alejandro Mulling Entered By: Alejandro Mulling on 06/19/2016 15:45:39 [name] Mura ([id number]) -------------------------------------------------------------------------------- Vitals Details Patient Name: [name] Mura Date of Service: 06/19/2016 3:00 PM Medical Record Number: [id number] Patient Account Number: [id number] Date of Birth/Sex: 07-25-1956 (60 y.o. Male) Treating RN: Phillis Haggis Primary Care Physician: FITZGERALD, [name] Other Clinician: Referring Physician: FITZGERALD, [name] Treating Physician/Extender: Maxwell Caul Weeks in Treatment: 0 Vital Signs Time Taken: 15:32 Temperature (F): 98.1 Height (in): 72 Pulse (bpm): 87 Respiratory Rate (breaths/min): 18 Blood Pressure (mmHg): 110/66 Reference Range: 80 - 120 mg / dl Electronic Signature(s) Signed: 06/19/2016 5:01:48 PM By: Alejandro Mulling Entered By: Alejandro Mulling on 06/19/2016 15:32:26

## 2016-06-27 ENCOUNTER — Encounter: Payer: Medicare Other | Attending: Internal Medicine | Admitting: Internal Medicine

## 2016-06-27 DIAGNOSIS — L97321 Non-pressure chronic ulcer of left ankle limited to breakdown of skin: Secondary | ICD-10-CM | POA: Diagnosis present

## 2016-06-27 DIAGNOSIS — E1122 Type 2 diabetes mellitus with diabetic chronic kidney disease: Secondary | ICD-10-CM | POA: Diagnosis not present

## 2016-06-27 DIAGNOSIS — N186 End stage renal disease: Secondary | ICD-10-CM | POA: Insufficient documentation

## 2016-06-27 DIAGNOSIS — Z992 Dependence on renal dialysis: Secondary | ICD-10-CM | POA: Insufficient documentation

## 2016-06-27 DIAGNOSIS — I509 Heart failure, unspecified: Secondary | ICD-10-CM | POA: Insufficient documentation

## 2016-06-27 DIAGNOSIS — L97211 Non-pressure chronic ulcer of right calf limited to breakdown of skin: Secondary | ICD-10-CM | POA: Insufficient documentation

## 2016-06-27 DIAGNOSIS — I132 Hypertensive heart and chronic kidney disease with heart failure and with stage 5 chronic kidney disease, or end stage renal disease: Secondary | ICD-10-CM | POA: Diagnosis not present

## 2016-06-27 DIAGNOSIS — E1142 Type 2 diabetes mellitus with diabetic polyneuropathy: Secondary | ICD-10-CM | POA: Diagnosis not present

## 2016-06-27 DIAGNOSIS — D631 Anemia in chronic kidney disease: Secondary | ICD-10-CM | POA: Insufficient documentation

## 2016-06-27 DIAGNOSIS — M14672 Charcot's joint, left ankle and foot: Secondary | ICD-10-CM | POA: Insufficient documentation

## 2016-06-28 NOTE — Progress Notes (Signed)
[name] [name], [name] [name] ([id number]) Visit Report for 06/27/2016 Chief Complaint Document Details Patient Name: [name] [name], [name] [name] Date of Service: 06/27/2016 1:30 PM Medical Record Patient Account Number: [id number] Number: Treating RN: Clover Mealy, RN, BSN, Rita 09-11-1956 [phone] y.o. Other Clinician: Date of Birth/Sex: Male) Treating Sigfredo Schreier Primary Care Physician: [name], [name] Physician/Extender: G Referring Physician: FITZGERALD, [name] Weeks in Treatment: 2 Information Obtained from: Patient Chief Complaint Right second toe wound and left lateral malleolar wound. is doing fine and has no fresh issues. He does have his appointment with the vascular surgeons. However he does not have an appointment with the orthopedic surgeons yet. he did not come for 2 weeks because of the bad weather. He has no fresh issues. 04/10/16; the patient returns today with a wound over the last 2-3 weeks on the anterior part of his left ankle. Electronic Signature(s) Signed: 06/28/2016 7:30:59 AM By: Baltazar Najjar MD Entered By: Baltazar Najjar on 06/27/2016 13:52:10 [name] [name] ([id number]) -------------------------------------------------------------------------------- Debridement Details Patient Name: [name] [name] Date of Service: 06/27/2016 1:30 PM Medical Record Patient Account Number: [id number] Number: Treating RN: Clover Mealy, RN, BSN, Rita 05-Jun-1956 [phone] y.o. Other Clinician: Date of Birth/Sex: Male) Treating Kashus Karlen Primary Care Physician: [name], [name] Physician/Extender: G Referring Physician: FITZGERALD, [name] Weeks in Treatment: 2 Debridement Performed for Wound #6 Left,Anterior Lower Leg Assessment: Performed By: Physician Maxwell Caul, MD Debridement: Debridement Pre-procedure Yes Verification/Time Out Taken: Start Time: 13:46 Pain Control: Lidocaine 4% Topical Solution Total Area Debrided (L x 0.7 (cm) x 1.5 (cm) = 1.05 (cm) W): Tissue and other Non-Viable,  Fibrin/Slough, Subcutaneous material debrided: Instrument: Curette Bleeding: Minimum Hemostasis Achieved: Pressure End Time: 13:48 Procedural Pain: 0 Post Procedural Pain: 0 Response to Treatment: Procedure was tolerated well Post Debridement Measurements of Total Wound Length: (cm) 0.7 Width: (cm) 1.5 Depth: (cm) 0.2 Volume: (cm) 0.165 Post Procedure Diagnosis Same as Pre-procedure Electronic Signature(s) Signed: 06/27/2016 5:42:05 PM By: Elpidio Eric BSN, RN Signed: 06/28/2016 7:30:59 AM By: Baltazar Najjar MD Entered By: Baltazar Najjar on 06/27/2016 13:51:42 [name] [name] ([id number]) -------------------------------------------------------------------------------- HPI Details Patient Name: [name] [name] Date of Service: 06/27/2016 1:30 PM Medical Record Patient Account Number: [id number] Number: Treating RN: Clover Mealy, RN, BSN, Rita Jan 29, 1956 (59 y.o. Other Clinician: Date of Birth/Sex: Male) Treating Markelle Asaro Primary Care Physician: [name], [name] Physician/Extender: G Referring Physician: FITZGERALD, [name] Weeks in Treatment: 2 History of Present Illness Location: left fourth toe Quality: Patient reports experiencing a dull pain to affected area(s). Severity: Patient states wound (s) are getting better. Duration: Patient states that they are not certain how long the wound has been present. Timing: Pain in wound is constant (hurts all the time) Context: The wound appeared gradually over time Associated Signs and Symptoms: nil HPI Description: has been doing fine and has no fresh issues. He does say that he is trying to get all his appointments in order and at present he has a appointment with the vascular surgeons. While at home he removes his walking shoe and keeps the leg and foot open. There is no pain and minimal drainage. 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 He tells me that over the last 2-3 weeks he has noticed pain and a wound  on the crease of his left dorsal ankle and he is here for our review events. Because of the deformity in his ankle he has a fracture boot that he uses the walker. [facility] [place] [facility] he was in hospital in January and suffered a wide complex cardiac arrest at the time of the fistulagram. He has recovered from this. 04/17/16 a his left dorsal ankle appears to be improved. He has an appointment with [employer] to adjust his boot next week. He does not appear to have significant PAD but does have significant venous insufficiencywent 04/24/16; went to [facility] last week,did not adjust his boot due to edema. She has called his cardiologist and in the meantime they have apparently taken some extra fluid off him at dialysis. He dialyzes Monday Wednesday and Friday 05/01/16; the patient arrived with some scant surface eschar over his dorsal ankle wound. This was debridement. There is nothing but normal skin under this. His leg seems to have done well with a 2 layer compression we put on this last week accompanied by extra fluid removal at dialysis's resulted in less edema of his leg. He has chronic venous insufficiency with resultant edema been eating his chronic renal failure and deformity of his left ankle. 06/13/16; the patient arrives today with a recurrent wound in the same spot as last time. He has chronic venous insufficiency also deformity of the foot and ankle likely secondary to Charcot deformity. The patient has type 2 diabetes with polyneuropathy. He states the stockings he was wearing were too tight and he feels that's why it rubbed his ankle. He also has a Systems developerCharcot boot, that was adjusted last  time. [name] MuraBNEY, [name] [name] ([id number]) 06/19/16; Arrives today with a new wound on the right anterior leg. The original wound on the crease of his dorsal left ankle in the setting of chronic venous insufficiency and a Charcot deformity in this area is roughly the same. 06/27/16; the area on the right anterior leg is just about closed over, only 2 small areas remaining. The original wound in the crease of the dorsal left ankle in the setting of chronic venous insufficiency and a Charcot deformity appears improved. The debridement of adherent surface slough and nonviable subcutaneous tissue post debridement everything looks quite healthy here. Electronic Signature(s) Signed: 06/28/2016 7:30:59 AM By: Baltazar Najjarobson, Carmel Waddington MD Entered By: Baltazar Najjarobson, Haidar Muse on 06/27/2016 13:53:59 [name] MuraABNEY, [name] [name] ([id number]) -------------------------------------------------------------------------------- Physical Exam Details Patient Name: [name] MuraABNEY, [name] [name] Date of Service: 06/27/2016 1:30 PM Medical Record Patient Account Number: [id number] Number: Treating RN: Clover MealyAfful, RN, BSN, Rita 03/14/1956 [phone](59 y.o. Other Clinician: Date of Birth/Sex: Male) Treating Jaena Brocato Primary Care Physician: Sampson GoonFITZGERALD, [name] Physician/Extender: G Referring Physician: FITZGERALD, [name] Weeks in Treatment: 2 Notes Wound exam; wound on the dorsal left ankle looks better. This was aggressively debridement last week, less aggressive debridement this week of surface slough. No evidence of surrounding infection. With thick surrounding skin was removed last week. The area on the right anterior leg which was a question wrap injury is almost completely healed Electronic Signature(s) Signed: 06/28/2016 7:30:59 AM By: Baltazar Najjarobson, Micco Bourbeau MD Entered By: Baltazar Najjarobson, Alyssamae Klinck on 06/27/2016 14:00:22 [name] MuraABNEY, [name] [name] ([id number]) -------------------------------------------------------------------------------- Physician Orders Details Patient Name: [name] MuraABNEY, [name] [name] Date of  Service: 06/27/2016 1:30 PM Medical Record Patient Account Number: [id number] Number: Treating RN: Clover MealyAfful, RN, BSN, Rita 08/02/1956 [phone](59 y.o. Other Clinician: Date of Birth/Sex: Male) Treating Ranny Wiebelhaus Primary Care Physician: Sampson GoonFITZGERALD, [name] Physician/Extender: G Referring Physician: FITZGERALD, [name] Tania AdeWeeks  in Treatment: 2 Verbal / Phone Orders: Yes Clinician: Afful, RN, BSN, Rita Read Back and Verified: Yes Diagnosis Coding Wound Cleansing Wound #6 Left,Anterior Lower Leg o Clean wound with Normal Saline. Wound #7 Right,Anterior Lower Leg o Clean wound with Normal Saline. Anesthetic Wound #6 Left,Anterior Lower Leg o Topical Lidocaine 4% cream applied to wound bed prior to debridement Wound #7 Right,Anterior Lower Leg o Topical Lidocaine 4% cream applied to wound bed prior to debridement Skin Barriers/Peri-Wound Care Wound #6 Left,Anterior Lower Leg o Barrier cream Wound #7 Right,Anterior Lower Leg o Skin Prep Primary Wound Dressing Wound #6 Left,Anterior Lower Leg o Aquacel Ag Wound #7 Right,Anterior Lower Leg o Aquacel Ag Secondary Dressing Wound #6 Left,Anterior Lower Leg o ABD pad o Dry Gauze Wound #7 Right,Anterior Lower Leg [name] [name], [name] [name] ([id number]) o Boardered Foam Dressing Dressing Change Frequency Wound #6 Left,Anterior Lower Leg o Change dressing every week Wound #7 Right,Anterior Lower Leg o Change dressing every week Follow-up Appointments Wound #6 Left,Anterior Lower Leg o Return Appointment in 1 week. Wound #7 Right,Anterior Lower Leg o Return Appointment in 1 week. Edema Control Wound #6 Left,Anterior Lower Leg o 2 Layer Compression System - Left Lower Extremity Off-Loading Wound #6 Left,Anterior Lower Leg o Turn and reposition every 2 hours - Stay off foot and try to keep boot off as long as possible. Electronic Signature(s) Signed: 06/27/2016 5:42:05 PM By: Elpidio Eric BSN, RN Signed: 06/28/2016  7:30:59 AM By: Baltazar Najjar MD Entered By: Elpidio Eric on 06/27/2016 13:50:22 [name] [name] ([id number]) -------------------------------------------------------------------------------- Problem List Details Patient Name: [name] [name] Date of Service: 06/27/2016 1:30 PM Medical Record Patient Account Number: [id number] Number: Treating RN: Clover Mealy, RN, BSN, Rita 03/08/1956 [phone] y.o. 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 limited to 06/13/2016 Yes breakdown of skin E11.42 Type 2 diabetes mellitus with diabetic polyneuropathy 06/13/2016 Yes M14.672 Charcot's joint, left ankle and foot 06/13/2016 Yes L97.211 Non-pressure chronic ulcer of right calf limited to 06/19/2016 Yes breakdown of skin Inactive Problems Resolved Problems Electronic Signature(s) Signed: 06/28/2016 7:30:59 AM By: Baltazar Najjar MD Entered By: Baltazar Najjar on 06/27/2016 13:51:15 [name] [name] ([id number]) -------------------------------------------------------------------------------- Progress Note Details Patient Name: [name] [name] Date of Service: 06/27/2016 1:30 PM Medical Record Patient Account Number: [id number] Number: Treating RN: Clover Mealy, RN, BSN, Rita 25-Aug-1956 [phone] y.o. Other Clinician: Date of Birth/Sex: Male) Treating Dontravious Camille Primary Care Physician: [name], [name] Physician/Extender: G Referring Physician: FITZGERALD, [name] Weeks in Treatment: 2 Subjective Chief Complaint Information obtained from Patient Right second toe wound and left lateral malleolar wound. is doing fine and has no fresh issues. He does have his appointment with the vascular surgeons. However he does not have an appointment with  the orthopedic surgeons yet. he did not come for 2 weeks because of the bad weather. He has no fresh issues. 04/10/16; the patient returns today with a wound over the last 2-3 weeks on the anterior part of his left ankle. History of Present Illness (HPI) The following HPI elements were documented for the patient's wound: Location: left fourth toe Quality: Patient reports experiencing a dull pain to affected area(s). Severity: Patient states wound (s) are getting better. Duration: Patient states that they are not certain how long the wound has been present. Timing: Pain in wound is constant (hurts all the time) Context: The  wound appeared gradually over time Associated Signs and Symptoms: nil has been doing fine and has no fresh issues. He does say that he is trying to get all his appointments in order and at present he has a appointment with the vascular surgeons. While at home he removes his walking shoe and keeps the leg and foot open. There is no pain and minimal drainage. 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 He tells me that over the last 2-3 weeks he has noticed pain and a wound on the crease of his left dorsal ankle and he is here for our review events. Because of the deformity in his ankle he has a fracture boot that he uses the walker. This [facility]. [facility] he was in hospital in January and suffered a wide complex cardiac arrest at the time of the fistulagram. He has recovered from this. 04/17/16 a his left dorsal ankle appears to be improved. He has an appointment with [facility] to adjust his boot next week. He does not appear to have significant PAD but does  have significant venous insufficiencywent 04/24/16; went to [facility] last week,did not adjust his boot due to edema. She has called his cardiologist and in the meantime they have apparently taken some extra fluid off him at dialysis. He dialyzes Monday Wednesday and Friday [name] [name], [name] [name] ([id number]) 05/01/16; the patient arrived with some scant surface eschar over his dorsal ankle wound. This was debridement. There is nothing but normal skin under this. His leg seems to have done well with a 2 layer compression we put on this last week accompanied by extra fluid removal at dialysis's resulted in less edema of his leg. He has chronic venous insufficiency with resultant edema been eating his chronic renal failure and deformity of his left ankle. 06/13/16; the patient arrives today with a recurrent wound in the same spot as last time. He has chronic venous insufficiency also deformity of the foot and ankle likely secondary to Charcot deformity. The patient has type 2 diabetes with polyneuropathy. He states the stockings he was wearing were too tight and he feels that's why it rubbed his ankle. He also has a Systems developer, that was adjusted last time. 06/19/16; Arrives today with a new wound on the right anterior leg. The original wound on the crease of his dorsal left ankle in the setting of chronic venous insufficiency and a Charcot deformity in this area is roughly the same. 06/27/16; the area on the right anterior leg is just about closed over, only 2 small areas remaining. The original wound in the crease of the dorsal left ankle in the setting of chronic venous insufficiency and a Charcot deformity appears improved. The debridement of adherent surface slough and nonviable subcutaneous tissue post debridement everything looks quite healthy here. Objective Constitutional Vitals Time Taken: 1:36 PM, Height: 72 in, Temperature: 98.2 F, Pulse: 90 bpm, Respiratory Rate: 16 breaths/min, Blood Pressure:  92/62 mmHg. Integumentary (Hair, Skin) Wound #6 status is Open. Original cause of wound was Gradually Appeared. The wound is located on the Left,Anterior Lower Leg. The wound measures 0.7cm length x 1.5cm width x 0.2cm depth; 0.825cm^2 area and  0.165cm^3 volume. The wound is limited to skin breakdown. There is no tunneling or undermining noted. There is a large amount of serosanguineous drainage noted. The wound margin is distinct with the outline attached to the wound base. There is large (67-100%) pink, pale granulation within the wound bed. There is a small (1-33%) amount of necrotic tissue within the wound bed including Adherent Slough. The periwound skin appearance exhibited: Localized Edema, Moist. The periwound skin appearance did not exhibit: Callus, Crepitus, Excoriation, Fluctuance, Friable, Induration, Rash, Scarring, Dry/Scaly, Maceration, Atrophie Blanche, Cyanosis, Ecchymosis, Hemosiderin Staining, Mottled, Pallor, Rubor, Erythema. Periwound temperature was noted as No Abnormality. Wound #7 status is Open. Original cause of wound was Shear/Friction. The wound is located on the Right,Anterior Lower Leg. The wound measures 1.5cm length x 1cm width x 0.1cm depth; 1.178cm^2 area and 0.118cm^3 volume. The wound is limited to skin breakdown. There is no tunneling or undermining noted. There is a large amount of serosanguineous drainage noted. The wound margin is flat and intact. There is large (67-100%) red, pink granulation within the wound bed. There is no necrotic tissue within the wound bed. The periwound skin appearance exhibited: Moist. Periwound temperature was noted as No [name] [name], [name] [name] ([id number]) Abnormality. The periwound has tenderness on palpation. 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 There was a Debridement [id number]) debridement with total area of 1.05 sq cm performed by Maxwell CaulOBSON, Larya Charpentier G, MD. with the following instrument(s): Curette to remove Non-Viable tissue/material including Fibrin/Slough and Subcutaneous after achieving pain control using Lidocaine 4% Topical Solution. A time out was conducted prior to the start of the procedure. A Minimum amount of bleeding was controlled with Pressure. The procedure was tolerated well with a pain level of 0 throughout and a pain level of 0 following the procedure. Post Debridement Measurements: 0.7cm length x 1.5cm width x 0.2cm depth; 0.165cm^3 volume. Post procedure Diagnosis Wound #6: Same as Pre-Procedure Plan Wound Cleansing: Wound #6 Left,Anterior Lower Leg: Clean wound with Normal Saline. Wound #7 Right,Anterior Lower Leg: Clean wound with Normal Saline. Anesthetic: Wound #6 Left,Anterior Lower Leg: Topical Lidocaine 4% cream applied to wound bed prior to debridement Wound #7 Right,Anterior Lower Leg: [name] [name], [name] [name] ([id number]) Topical Lidocaine 4% cream applied to wound bed prior to debridement Skin Barriers/Peri-Wound Care: Wound #6 Left,Anterior Lower Leg: Barrier cream Wound #7 Right,Anterior Lower Leg: Skin Prep Primary Wound Dressing: Wound #6 Left,Anterior Lower Leg: Aquacel Ag Wound #7 Right,Anterior Lower Leg: Aquacel Ag Secondary Dressing: Wound #6 Left,Anterior Lower Leg: ABD pad Dry Gauze Wound #7 Right,Anterior Lower Leg: Boardered Foam Dressing Dressing Change Frequency: Wound #6 Left,Anterior Lower Leg: Change dressing every week Wound #7 Right,Anterior Lower Leg: Change dressing every week Follow-up Appointments: Wound #6 Left,Anterior Lower Leg: Return Appointment in 1  week. Wound #7 Right,Anterior Lower Leg: Return Appointment in 1 week. Edema Control: Wound #6 Left,Anterior Lower Leg: 2 Layer Compression System - Left Lower Extremity Off-Loading: Wound #6 Left,Anterior Lower Leg: Turn and reposition every 2 hours - Stay off foot and try to keep boot off as long as possible. #1 continue Aquacel Ag to the left anterior ankle the Kerlix Coban wrap. This appears to  be progressing towards closure although this is not the first time that we've been able to get this to close. #2 continue Aquacel Ag under border foam to the right leg edema control here is acceptable Electronic Signature(s) Signed: 06/28/2016 7:30:59 AM By: Baltazar Najjar MD Entered By: Baltazar Najjar on 06/27/2016 14:01:56 [name] [name] ([id number]) [name] [name] ([id number]) -------------------------------------------------------------------------------- SuperBill Details Patient Name: [name] [name] Date of Service: 06/27/2016 Medical Record Patient Account Number: [id number] Number: Treating RN: Clover Mealy, RN, BSN, Rita 11-13-1956 [phone] y.o. 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 to breakdown of skin E11.42 Type 2 diabetes mellitus with diabetic polyneuropathy M14.672 Charcot's joint, left ankle and foot L97.211 Non-pressure chronic ulcer of right calf limited to breakdown of skin Facility Procedures CPT4 Code Description: [id number] 11042 - DEB SUBQ TISSUE 20 SQ CM/< ICD-10 Description Diagnosis L97.321 Non-pressure chronic ulcer of left ankle limited to Modifier: breakdown of Quantity: 1 skin Physician Procedures CPT4 Code Description: [id number] 11042 - WC PHYS SUBQ TISS 20 SQ CM ICD-10 Description Diagnosis L97.321 Non-pressure  chronic ulcer of left ankle limited to Modifier: breakdown of Quantity: 1 skin Electronic Signature(s) Signed: 06/28/2016 7:30:59 AM By: Baltazar Najjar MD Entered By: Baltazar Najjar on 06/27/2016 14:02:48

## 2016-06-28 NOTE — Progress Notes (Signed)
DEWITTE, VANNICE ([id number]) Visit Report for 06/27/2016 Arrival Information Details Patient Name: [name] [name], [name] [name] Date of Service: 06/27/2016 1:30 PM Medical Record Number: [id number] Patient Account Number: [id number] Date of Birth/Sex: 1956-12-18 (60 y.o. Male) Treating RN: Clover Mealy, RN, BSN, [facility] Primary Care Physician: FITZGERALD, [name] Other Clinician: Referring Physician: FITZGERALD, [name] Treating Physician/Extender: Altamese [place] in Treatment: 2 Visit Information History Since Last Visit Added or deleted any medications: No Patient Arrived: Wheel Chair Any new allergies or adverse reactions: No Arrival Time: 13:34 Had a fall or experienced change in No activities of daily living that may affect Accompanied By: self risk of falls: Transfer Assistance: None Signs or symptoms of abuse/neglect since last No Patient Identification Verified: Yes visito Secondary Verification Process Yes Hospitalized since last visit: No Completed: Has Dressing in Place as Prescribed: Yes Patient Requires Transmission-Based No Has Compression in Place as Prescribed: Yes Precautions: Pain Present Now: No Patient Has Alerts: No Electronic Signature(s) Signed: 06/27/2016 5:42:05 PM By: Elpidio Eric BSN, RN Entered By: Elpidio Eric on 06/27/2016 13:34:46 [name] [name] ([id number]) -------------------------------------------------------------------------------- Encounter Discharge Information Details Patient Name: [name] [name] Date of Service: 06/27/2016 1:30 PM Medical Record Number: [id number] Patient Account Number: [id number] Date of Birth/Sex: [birth year] (60 y.o. Male) Treating RN: Clover Mealy, RN, BSN, Rita Primary Care Physician: FITZGERALD, [name] Other Clinician: Referring Physician: FITZGERALD, [name] Treating Physician/Extender: Altamese  in Treatment: 2 Encounter Discharge Information Items Discharge Pain Level: 0 Discharge Condition: Stable Ambulatory Status: Wheelchair Discharge  Destination: Home Transportation: Private Auto Accompanied By: self Schedule Follow-up Appointment: No Medication Reconciliation completed No and provided to Patient/Care Thanh Mottern: Provided on Clinical Summary of Care: 06/27/2016 Form Type Recipient Paper Patient TA Electronic Signature(s) Signed: 06/27/2016 2:02:03 PM By: Gwenlyn Perking Entered By: Gwenlyn Perking on 06/27/2016 14:02:03 [name] [name] ([id number]) -------------------------------------------------------------------------------- Multi Wound Chart Details Patient Name: [name] [name] Date of Service: 06/27/2016 1:30 PM Medical Record Number: [id number] Patient Account Number: [id number] Date of Birth/Sex: 1956-08-26 (60 y.o. 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] [7:the Lower Extremity] Comorbid History: [6:Cataracts, Anemia, Arrhythmia, Congestive Arrhythmia, Congestive Heart Failure, Coronary Heart Failure, Coronary Artery Disease, Hypertension, Myocardial Hypertension, Myocardial Infarction, Type II Diabetes, End Stage Renal Disease,  Osteoarthritis, Neuropathy Osteoarthritis, Neuropathy] [7:Cataracts, Anemia, Artery Disease, Infarction, Type II Diabetes, End Stage Renal Disease,] [N/A:N/A] Date Acquired: [6:05/16/2016] [7:06/12/2016] [N/A:N/A] Weeks of Treatment: [6:2] [7:1] [N/A:N/A] Wound Status: [6:Open]  [7:Open] [N/A:N/A] Measurements L x W x D 0.7x1.5x0.2 [7:1.5x1x0.1] [N/A:N/A] (cm) Area (cm) : [6:0.825] [7:1.178] [N/A:N/A] Volume (cm) : [6:0.165] [7:0.118] [N/A:N/A] % Reduction in Area: [6:25.00%] [7:6.30%] [N/A:N/A] % Reduction in Volume: 92.50% [7:6.30%] [N/A:N/A] Classification: [6:Grade 1] [7:Grade 1] [N/A:N/A] Exudate Amount: [6:Large] [7:Large] [N/A:N/A] Exudate Type: [6:Serosanguineous] [7:Serosanguineous] [N/A:N/A] Exudate Color: [6:red, brown] [7:red, brown] [N/A:N/A] Wound Margin: [6:Distinct, outline attached Flat and Intact] [N/A:N/A] Granulation Amount: [6:Large (67-100%)] [7:Large (67-100%)] [N/A:N/A] Granulation Quality: [6:Pink, Pale] [7:Red, Pink] [N/A:N/A] Necrotic Amount: Small (1-33%) None Present (0%) N/A Exposed Structures: Fascia: No Fascia: No N/A Fat: No Fat: No Tendon: No Tendon: No Muscle: No Muscle: No Joint: No Joint: No Bone:  No Bone: No Limited to Skin Limited to Skin Breakdown Breakdown Epithelialization: None None N/A Periwound Skin Texture: Edema: Yes No Abnormalities Noted N/A Excoriation: No Induration: No Callus: No Crepitus: No Fluctuance: No Friable: No Rash: No Scarring: No Periwound Skin Moist: Yes Moist: Yes N/A Moisture: Maceration: No Dry/Scaly: No Periwound Skin Color: Atrophie Blanche: No No Abnormalities Noted N/A Cyanosis: No Ecchymosis: No Erythema: No Hemosiderin Staining: No Mottled: No Pallor: No Rubor: No Temperature: No Abnormality No Abnormality N/A Tenderness on No Yes N/A Palpation: Wound Preparation: Ulcer Cleansing: Ulcer Cleansing: N/A Rinsed/Irrigated with Rinsed/Irrigated with Saline, Other: surg scrub Saline with water Topical Anesthetic Topical Anesthetic Applied: None Applied: Other: lidocaine 4% Treatment Notes Electronic Signature(s) Signed: 06/27/2016 5:42:05 PM By: Elpidio EricAfful, Rita BSN, RN Entered By: Elpidio EricAfful, Rita on 06/27/2016 13:48:11 [name] MuraABNEY, Karston  ([id number]) -------------------------------------------------------------------------------- Multi-Disciplinary Care Plan Details Patient Name: [name] MuraABNEY, [name] [name] Date of Service: 06/27/2016 1:30 PM Medical Record Number: [id number] Patient Account Number: [id number] Date of Birth/Sex: 10/17/1956 [age] y.o. Male) Treating RN: Clover MealyAfful, RN, BSN, Rita Primary Care Physician: FITZGERALD, [name] Other Clinician: Referring Physician: FITZGERALD, [name] Treating Physician/Extender: Altamese CarolinaOBSON, MICHAEL G Weeks in Treatment: 2 Active Inactive Orientation to the Wound Care Program Nursing Diagnoses: Knowledge deficit related to the wound healing center program Goals: Patient/caregiver will verbalize understanding of the Wound Healing Center Program Date Initiated: 06/13/2016 Goal Status: Active Interventions: Provide education on orientation to the wound center Notes: Wound/Skin Impairment Nursing Diagnoses: Impaired tissue integrity Knowledge deficit related to ulceration/compromised skin integrity Goals: Patient/caregiver will verbalize understanding of skin care regimen Date Initiated: 06/13/2016 Goal Status: Active Ulcer/skin breakdown will have a volume reduction of 30% by week 4 Date Initiated: 06/13/2016 Goal Status: Active Ulcer/skin breakdown will have a volume reduction of 50% by week 8 Date Initiated: 06/13/2016 Goal Status: Active Ulcer/skin breakdown will have a volume reduction of 80% by week 12 Date Initiated: 06/13/2016 Goal Status: Active Ulcer/skin breakdown will heal within 14 weeks Date Initiated: 06/13/2016 [name] MuraBNEY, Claudie ([id number]) Goal Status: Active Interventions: Assess patient/caregiver ability to obtain necessary supplies Assess patient/caregiver ability to perform ulcer/skin care regimen upon admission and as needed Provide education on ulcer and skin care Notes: Electronic Signature(s) Signed: 06/27/2016 5:42:05 PM By: Elpidio EricAfful, Rita BSN, RN Entered By: Elpidio EricAfful, Rita on  06/27/2016 13:45:40 [name] MuraABNEY, Keisean ([id number]) -------------------------------------------------------------------------------- Pain Assessment Details Patient Name: [name] MuraABNEY, Mayfield Date of Service: 06/27/2016 1:30 PM Medical Record Number: [id number] Patient Account Number: [id number] Date of Birth/Sex: 06/19/1956 [age] y.o. Male) Treating RN: Clover MealyAfful, RN, BSN, Rita Primary Care Physician: FITZGERALD, [name] Other Clinician: Referring Physician: FITZGERALD, [name] Treating Physician/Extender: Maxwell CaulOBSON, MICHAEL G Weeks in Treatment: 2 Active Problems Location of Pain Severity and Description of Pain Patient Has Paino No Site Locations With Dressing Change: No Pain Management and Medication Current Pain Management: Electronic Signature(s) Signed: 06/27/2016 5:42:05 PM By: Elpidio EricAfful, Rita BSN, RN Entered By: Elpidio EricAfful, Rita on 06/27/2016 13:34:53 [name] MuraABNEY, Vuk ([id number]) -------------------------------------------------------------------------------- Patient/Caregiver Education Details Patient Name: [name] MuraABNEY, Hussain Date of Service: 06/27/2016 1:30 PM Medical Record Number: [id number] Patient Account Number: [id number] Date of Birth/Gender: 11/16/1956 [age] y.o. Male) Treating RN: Clover MealyAfful, RN, BSN, Rita Primary Care Physician: FITZGERALD, [name] Other Clinician: Referring Physician: FITZGERALD, [name] Treating Physician/Extender: Altamese CarolinaOBSON, MICHAEL G Weeks in Treatment: 2 Education Assessment Education Provided To: Patient Education Topics Provided Welcome To The Wound Care Center: Methods: Explain/Verbal Responses: State content correctly Wound/Skin Impairment: Methods: Explain/Verbal Responses: State content correctly Electronic Signature(s) Signed: 06/27/2016 5:42:05 PM By: Elpidio EricAfful, Rita BSN, RN Entered By: Elpidio EricAfful, Rita on 06/27/2016 14:01:42 Kraynak, Sharon (  [id number]) -------------------------------------------------------------------------------- Wound Assessment Details Patient Name: [name] MuraBNEY, Ronon Date of  Service: 06/27/2016 1:30 PM Medical Record Number: [id number] Patient Account Number: [id number] Date of Birth/Sex: 10/14/1956 [age] y.o. Male) Treating RN: Clover MealyAfful, RN, BSN, [facility]Rita Primary Care Physician: FITZGERALD, [name] Other Clinician: Referring Physician: FITZGERALD, [name] Treating Physician/Extender: Maxwell CaulOBSON, MICHAEL G Weeks in Treatment: 2 Wound Status Wound Number: 6 Primary Diabetic Wound/Ulcer of the Lower Etiology: Extremity Wound Location: Left Lower Leg - Anterior Wound Open Wounding Event: Gradually Appeared Status: Date Acquired: 05/16/2016 Comorbid Cataracts, Anemia, Arrhythmia, Weeks Of Treatment: 2 History: Congestive Heart Failure, Coronary Clustered Wound: No Artery Disease, Hypertension, Myocardial Infarction, Type II Diabetes, End Stage Renal Disease, Osteoarthritis, Neuropathy Photos Photo Uploaded By: Elpidio EricAfful, Rita on 06/27/2016 17:27:55 Wound Measurements Length: (cm) 0.7 Width: (cm) 1.5 Depth: (cm) 0.2 Area: (cm) 0.825 Volume: (cm) 0.165 % Reduction in Area: 25% % Reduction in Volume: 92.5% Epithelialization: None Tunneling: No Undermining: No Wound Description Classification: Grade 1 Wound Margin: Distinct, outline attached Exudate Amount: Large Exudate Type: Serosanguineous Exudate Color: red, brown Foul Odor After Cleansing: No Wound Bed Granulation Amount: Large (67-100%) Exposed Structure Lance, Ziyon ([id number]) Granulation Quality: Pink, Pale Fascia Exposed: No Necrotic Amount: Small (1-33%) Fat Layer Exposed: No Necrotic Quality: Adherent Slough Tendon Exposed: No Muscle Exposed: No Joint Exposed: No Bone Exposed: No Limited to Skin Breakdown Periwound Skin Texture Texture Color No Abnormalities Noted: No No Abnormalities Noted: No Callus: No Atrophie Blanche: No Crepitus: No Cyanosis: No Excoriation: No Ecchymosis: No Fluctuance: No Erythema: No Friable: No Hemosiderin Staining: No Induration: No Mottled: No Localized  Edema: Yes Pallor: No Rash: No Rubor: No Scarring: No Temperature / Pain Moisture Temperature: No Abnormality No Abnormalities Noted: No Dry / Scaly: No Maceration: No Moist: Yes Wound Preparation Ulcer Cleansing: Rinsed/Irrigated with Saline, Other: surg scrub with water, Topical Anesthetic Applied: Other: lidocaine 4%, Treatment Notes Wound #6 (Left, Anterior Lower Leg) 1. Cleansed with: Cleanse wound with antibacterial soap and water 4. Dressing Applied: Aquacel Ag 5. Secondary Dressing Applied ABD Pad 7. Secured with 2 Layer Lite Compression System - Left Lower Extremity Notes BFD to right anterior LL Electronic Signature(s) Signed: 06/27/2016 5:42:05 PM By: Elpidio EricAfful, Rita BSN, RN Entered By: Elpidio EricAfful, Rita on 06/27/2016 13:44:07 [name] MuraBNEY, Maximum ([id number]) [name] MuraABNEY, Dannon ([id number]) -------------------------------------------------------------------------------- Wound Assessment Details Patient Name: [name] MuraABNEY, Raymel Date of Service: 06/27/2016 1:30 PM Medical Record Number: [id number] Patient Account Number: [id number] Date of Birth/Sex: 09/01/1956 [age] y.o. Male) Treating RN: Clover MealyAfful, RN, BSN, Rita Primary Care Physician: FITZGERALD, [name] Other Clinician: Referring Physician: FITZGERALD, [name] Treating Physician/Extender: Maxwell CaulOBSON, MICHAEL G Weeks in Treatment: 2 Wound Status Wound Number: 7 Primary Diabetic Wound/Ulcer of the Lower Etiology: Extremity Wound Location: Right Lower Leg - Anterior Wound Open Wounding Event: Shear/Friction Status: Date Acquired: 06/12/2016 Comorbid Cataracts, Anemia, Arrhythmia, Weeks Of Treatment: 1 History: Congestive Heart Failure, Coronary Clustered Wound: No Artery Disease, Hypertension, Myocardial Infarction, Type II Diabetes, End Stage Renal Disease, Osteoarthritis, Neuropathy Photos Photo Uploaded By: Elpidio EricAfful, Rita on 06/27/2016 17:27:55 Wound Measurements Length: (cm) 1.5 Width: (cm) 1 Depth: (cm) 0.1 Area: (cm) 1.178 Volume:  (cm) 0.118 % Reduction in Area: 6.3% % Reduction in Volume: 6.3% Epithelialization: None Tunneling: No Undermining: No Wound Description Classification: Grade 1 Wound Margin: Flat and Intact Exudate Amount: Large Exudate Type: Serosanguineous Exudate Color: red, brown Wound Bed Granulation Amount: Large (67-100%) Exposed Structure Detter, Huel ([id number]) Granulation Quality: Red, Pink Fascia Exposed: No Necrotic Amount: None Present (0%) Fat Layer Exposed: No Tendon Exposed: No Muscle Exposed: No Joint  Exposed: No Bone Exposed: No Limited to Skin Breakdown Periwound Skin Texture Texture Color No Abnormalities Noted: No No Abnormalities Noted: No Moisture Temperature / Pain No Abnormalities Noted: No Temperature: No Abnormality Moist: Yes Tenderness on Palpation: Yes Wound Preparation Ulcer Cleansing: Rinsed/Irrigated with Saline Topical Anesthetic Applied: None Treatment Notes Wound #7 (Right, Anterior Lower Leg) 1. Cleansed with: Cleanse wound with antibacterial soap and water 4. Dressing Applied: Aquacel Ag 5. Secondary Dressing Applied ABD Pad 7. Secured with 2 Layer Lite Compression System - Left Lower Extremity Notes BFD to right anterior LL Electronic Signature(s) Signed: 06/27/2016 5:42:05 PM By: Elpidio Eric BSN, RN Entered By: Elpidio Eric on 06/27/2016 13:44:20 [name] [name] ([id number]) -------------------------------------------------------------------------------- Vitals Details Patient Name: [name] [name] Date of Service: 06/27/2016 1:30 PM Medical Record Number: [id number] Patient Account Number: [id number] Date of Birth/Sex: 11-17-1956 (60 y.o. Male) Treating RN: Clover Mealy, RN, BSN, Rita Primary Care Physician: FITZGERALD, [name] Other Clinician: Referring Physician: FITZGERALD, [name] Treating Physician/Extender: Maxwell Caul Weeks in Treatment: 2 Vital Signs Time Taken: 13:36 Temperature (F): 98.2 Height (in): 72 Pulse (bpm):  90 Respiratory Rate (breaths/min): 16 Blood Pressure (mmHg): 92/62 Reference Range: 80 - 120 mg / dl Electronic Signature(s) Signed: 06/27/2016 5:42:05 PM By: Elpidio Eric BSN, RN Entered By: Elpidio Eric on 06/27/2016 13:36:36

## 2016-07-03 ENCOUNTER — Encounter: Payer: Medicare Other | Admitting: Nurse Practitioner

## 2016-07-03 DIAGNOSIS — L97321 Non-pressure chronic ulcer of left ankle limited to breakdown of skin: Secondary | ICD-10-CM | POA: Diagnosis not present

## 2016-07-04 NOTE — Progress Notes (Signed)
[name] [name], [name] [name] ([id number]) Visit Report for 07/03/2016 Chief Complaint Document Details Patient Name: [name] [name], [name] [name] Date of Service: 07/03/2016 1:30 PM Medical Record Number: [id number] Patient Account Number: [id number] Date of Birth/Sex: 06/20/1956 78(59 y.o. Male) Treating RN: [name] MealyAfful, RN, BSN, [name] Primary Care Physician: [name] [name] Other Clinician: Referring Physician: FITZGERALD, [name] Treating Physician/Extender: [name] GarnetSaunders, [name] Weeks in Treatment: 2 Information Obtained from: Patient Chief Complaint Right second toe wound and left lateral malleolar wound. is doing fine and has no fresh issues. He does have his appointment with the vascular surgeons. However he does not have an appointment with the orthopedic surgeons yet. he did not come for 2 weeks because of the bad weather. He has no fresh issues. 04/10/16; the patient returns today with a wound over the last 2-3 weeks on the anterior part of his left ankle. Electronic Signature(s) Signed: 07/03/2016 5:44:16 PM By: [name] LynchSaunders, Sharon FNP Entered By: [name] LynchSaunders, [name] on 07/03/2016 13:56:48 [name] [name], [name] [name] ([id number]) -------------------------------------------------------------------------------- HPI Details Patient Name: [name] [name], [name] [name] Date of Service: 07/03/2016 1:30 PM Medical Record Number: [id number] Patient Account Number: [id number] Date of Birth/Sex: 10/01/1956 [age] y.o. Male) Treating RN: [name] MealyAfful, RN, BSN, [name] [name] Primary Care Physician: [name] [name] Other Clinician: Referring Physician: FITZGERALD, [name] Treating Physician/Extender: [name] GarnetSaunders, [name] Weeks in Treatment: 2 History of Present Illness Location: left fourth toe Quality: Patient reports experiencing a dull pain to affected area(s). Severity: Patient states wound (s) are getting better. Duration: Patient states that they are not certain how long the wound has been present. Timing: Pain in wound is constant (hurts all the time) Context: The wound appeared  gradually over time Associated Signs and Symptoms: nil HPI Description: has been doing fine and has no fresh issues. He does say that he is trying to get all his appointments in order and at present he has a appointment with the vascular surgeons. While at home he removes his walking shoe and keeps the leg and foot open. There is no pain and minimal drainage. 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 He tells me that over the last 2-3 weeks he has noticed pain and a wound on the crease of his left dorsal ankle and he is here for our review events. Because of the deformity in his ankle he has a fracture boot that he uses the walker. [facility] [place] [facility] [place] [facility] he was in hospital in January and suffered a wide complex cardiac arrest at the time of the fistulagram. He has recovered from this. 04/17/16 a his left dorsal ankle appears to be improved. He has an appointment with [employer] to adjust his boot next week. He does not appear to have significant PAD but does have significant venous insufficiencywent 04/24/16; went to [facility] last week,did not adjust his boot due to edema. She has called his cardiologist and in the meantime they have apparently taken some extra fluid off him at dialysis. He dialyzes Monday Wednesday and Friday 05/01/16; the patient arrived with some scant surface eschar over his dorsal ankle wound. This was debridement. There is nothing but normal skin under this. His leg seems to have done well with a 2 layer compression we put on this last week  accompanied by extra fluid removal at dialysis's resulted in less edema of his leg. He has chronic venous insufficiency  with resultant edema been eating his chronic renal failure and deformity of his left ankle. 06/13/16; the patient arrives today with a recurrent wound in the same spot as last time. He has chronic venous insufficiency also deformity of the foot and ankle likely secondary to Charcot deformity. The patient has type 2 diabetes with polyneuropathy. He states the stockings he was wearing were too tight and he feels that's why it rubbed his ankle. He also has a Systems developer, that was adjusted last time. 06/19/16; Arrives today with a new wound on the right anterior leg. The original wound on the crease of his dorsal left ankle in the setting of chronic venous insufficiency and a Charcot deformity in this area is roughly [name] [name] ([id number]) the same. 06/27/16; the area on the right anterior leg is just about closed over, only 2 small areas remaining. The original wound in the crease of the dorsal left ankle in the setting of chronic venous insufficiency and a Charcot deformity appears improved. The debridement of adherent surface slough and nonviable subcutaneous tissue post debridement everything looks quite healthy here. 07/03/16: right lower leg wound healed. left lower leg wound improved. no reports of systemic s/s of infection. Electronic Signature(s) Signed: 07/03/2016 5:44:16 PM By: [name] Lynch FNP Entered By: [name] [name] on 07/03/2016 13:58:14 [name] [name] ([id number]) -------------------------------------------------------------------------------- Physical Exam Details Patient Name: [name] [name] Date of Service: 07/03/2016 1:30 PM Medical Record Number: [id number] Patient Account Number: [id number] Date of Birth/Sex: [birth year] (60 y.o. Male) Treating RN: [name] Mealy, RN, BSN, [name] [name] Primary Care Physician: [name] [name] Other Clinician: Referring Physician: FITZGERALD, [name] Treating Physician/Extender: [name] [name] in Treatment: 2 Constitutional Pulse regular and within  target range for patient.. Temperature is normal and within the target range for the patient.. Patient's appearance is neat and clean. Appears in no acute distress. Well nourished and well developed.. Eyes Conjunctivae clear. No discharge.. Ears, Nose, Mouth, and Throat Patient can hear normal speaking tones without difficulty.Marland Kitchen Respiratory Respiratory effort is easy and symmetric bilaterally. Rate is normal at rest and on room air.. Integumentary (Hair, Skin) skin dry and scaly BLE.. right lower leg wound has healed. 100% healthy red granulation tissue over the left lower dorsal leg wound. no s/s of infection.Marland Kitchen Psychiatric Judgement and insight intact.. Alert and oriented times 3.. No evidence of depression, anxiety, or agitation. Calm, cooperative, and communicative. Appropriate interactions and affect.. Electronic Signature(s) Signed: 07/03/2016 5:44:16 PM By: [name] Lynch FNP Entered By: [name] [name] on 07/03/2016 14:18:06 [name] [name] ([id number]) -------------------------------------------------------------------------------- Physician Orders Details Patient Name: [name] [name] Date of Service: 07/03/2016 1:30 PM Medical Record Number: [id number] Patient Account Number: [id number] Date of Birth/Sex: [birth year] (60 y.o. Male) Treating RN: Leonard Downing Primary Care Physician: [name] [name] Other Clinician: Referring Physician: [facility], [name] Treating Physician/Extender: [name] [name] in Treatment: 2 Verbal / Phone Orders: Yes Clinician: Leonard Downing Read Back and Verified: Yes Diagnosis Coding Wound Cleansing Wound #6 Left,Anterior Lower Leg o May shower with protection. Skin Barriers/Peri-Wound Care Wound #6 Left,Anterior Lower Leg o Barrier cream Primary Wound Dressing Wound #6 Left,Anterior Lower Leg o Aquacel Ag Secondary Dressing Wound #6 Left,Anterior Lower Leg o ABD pad o Dry Gauze Wound #7 Right,Anterior Lower Leg o  Boardered Foam Dressing Dressing Change Frequency Wound #6 Left,Anterior Lower Leg o Change dressing every week Follow-up Appointments Wound #6 Left,Anterior Lower Leg o Return Appointment in 1 week. Edema Control  Wound #6 Left,Anterior Lower Leg o 2 Layer Compression System - Left Lower Extremity Off-Loading Wound #6 Left,Anterior Lower Leg o Turn and reposition every 2 hours - Stay off foot and try to keep boot off as long as possible. [name] [name], [name] [name] ([id number]) Electronic Signature(s) Signed: 07/03/2016 4:21:40 PM By: Maureen Chatters Signed: 07/03/2016 5:44:16 PM By: [name] Lynch FNP Entered By: Lucrezia Starch RN, Sendra on 07/03/2016 13:55:39 [name] [name] ([id number]) -------------------------------------------------------------------------------- Problem List Details Patient Name: [name] [name] Date of Service: 07/03/2016 1:30 PM Medical Record Number: [id number] Patient Account Number: [id number] Date of Birth/Sex: [birth year] (60 y.o. 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 limited to 06/13/2016 Yes breakdown of skin E11.42 Type 2 diabetes mellitus with diabetic polyneuropathy 06/13/2016 Yes M14.672 Charcot's joint, left ankle and foot 06/13/2016 Yes L97.211 Non-pressure chronic ulcer of right calf limited to 06/19/2016 Yes breakdown of skin Inactive Problems Resolved Problems Electronic Signature(s) Signed: 07/03/2016 5:44:16 PM By: [name] Lynch FNP Entered By: [name] [name] on 07/03/2016 13:56:12 [name] [name] ([id number]) -------------------------------------------------------------------------------- Progress Note Details Patient Name: [name] [name] Date of Service:  07/03/2016 1:30 PM Medical Record Number: [id number] Patient Account Number: [id number] Date of Birth/Sex: 02-07-1956 (60 y.o. Male) Treating RN: Leonard Downing Primary Care Physician: [name] [name] Other Clinician: Referring Physician: FITZGERALD, [name] Treating Physician/Extender: [name] [name] in Treatment: 2 Subjective Chief Complaint Information obtained from Patient Right second toe wound and left lateral malleolar wound. is doing fine and has no fresh issues. He does have his appointment with the vascular surgeons. However he does not have an appointment with the orthopedic surgeons yet. he did not come for 2 weeks because of the bad weather. He has no fresh issues. 04/10/16; the patient returns today with a wound over the last 2-3 weeks on the anterior part of his left ankle. History of Present Illness (HPI) The following HPI elements were documented for the patient's wound: Location: left fourth toe Quality: Patient reports experiencing a dull pain to affected area(s). Severity: Patient states wound (s) are getting better. Duration: Patient states that they are not certain how long the wound has been present. Timing: Pain in wound is constant (hurts all the time) Context: The wound appeared gradually over time Associated Signs and Symptoms: nil has been doing fine and has no fresh issues. He does say that he is trying to get all his appointments in order and at present he has a appointment with the vascular surgeons. While at home he removes his walking shoe and keeps the leg and foot open. There is no pain and minimal drainage. 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 He tells me that over the last 2-3  weeks he has noticed pain and a wound on the crease of his left dorsal ankle and he is here for our review events. Because of the deformity in his ankle he has a fracture boot that he uses the walker. This was made at [employer] in [facility]. [facility] he was in hospital in January  and suffered a wide complex cardiac arrest at the time of the fistulagram. He has recovered from this. 04/17/16 a his left dorsal ankle appears to be improved. He has an appointment with [employer] to adjust his boot next week. He does not appear to have significant PAD but does have significant venous insufficiencywent 04/24/16; went to [facility] last week,did not adjust his boot due to edema. She has called his cardiologist and in the meantime they have apparently taken some extra fluid off him at dialysis. He dialyzes Monday Wednesday and Friday 05/01/16; the patient arrived with some scant surface eschar over his dorsal ankle wound. This was debridement. There is nothing but normal skin under this. His leg seems to have done well with a 2 layer [name] [name], [name] [name] ([id number]) compression we put on this last week accompanied by extra fluid removal at dialysis's resulted in less edema of his leg. He has chronic venous insufficiency with resultant edema been eating his chronic renal failure and deformity of his left ankle. 06/13/16; the patient arrives today with a recurrent wound in the same spot as last time. He has chronic venous insufficiency also deformity of the foot and ankle likely secondary to Charcot deformity. The patient has type 2 diabetes with polyneuropathy. He states the stockings he was wearing were too tight and he feels that's why it rubbed his ankle. He also has a Systems developer, that was adjusted last time. 06/19/16; Arrives today with a new wound on the right anterior leg. The original wound on the crease of his dorsal left ankle in the setting of chronic venous insufficiency and  a Charcot deformity in this area is roughly the same. 06/27/16; the area on the right anterior leg is just about closed over, only 2 small areas remaining. The original wound in the crease of the dorsal left ankle in the setting of chronic venous insufficiency and a Charcot deformity appears improved. The debridement of adherent surface slough and nonviable subcutaneous tissue post debridement everything looks quite healthy here. 07/03/16: right lower leg wound healed. left lower leg wound improved. no reports of systemic s/s of infection. denies fever, chills. body aches or malaise. remaining 10 pt ROS noncontributory. Objective Constitutional Pulse regular and within target range for patient.. Temperature is normal and within the target range for the patient.. Patient's appearance is neat and clean. Appears in no acute distress. Well nourished and well developed.. Vitals Time Taken: 1:40 PM, Height: 72 in, Temperature: 98.3 F, Pulse: 86 bpm, Blood Pressure: 96/63 mmHg. Eyes Conjunctivae clear. No discharge.. Ears, Nose, Mouth, and Throat Patient can hear normal speaking tones without difficulty.Marland Kitchen Respiratory Respiratory effort is easy and symmetric bilaterally. Rate is normal at rest and on room air.Marland Kitchen Psychiatric Judgement and insight intact.. Alert and oriented times 3.. No evidence of depression, anxiety, or agitation. Calm, cooperative, and communicative. Appropriate interactions and affect.[name] [name], [name] [name] ([id number]) Integumentary (Hair, Skin) skin dry and scaly BLE.. right lower leg wound has healed. 100% healthy red granulation tissue over the left lower dorsal leg wound. no s/s of infection.. Wound #6 status is Open. Original cause of wound was Gradually Appeared. The wound is located on the Left,Anterior Lower Leg. The wound measures 0.2cm length x 1.2cm width x 0.1cm depth; 0.188cm^2 area and 0.019cm^3 volume. The wound is limited to skin breakdown. There is no tunneling or  undermining noted. There is a small amount of serosanguineous drainage noted. The wound margin is distinct with the outline attached to the wound base. There is large (  67-100%) pink, pale granulation within the wound bed. There is no necrotic tissue within the wound bed. The periwound skin appearance exhibited: Localized Edema, Dry/Scaly. The periwound skin appearance did not exhibit: Callus, Crepitus, Excoriation, Fluctuance, Friable, Induration, Rash, Scarring, Maceration, Moist, Atrophie Blanche, Cyanosis, Ecchymosis, Hemosiderin Staining, Mottled, Pallor, Rubor, Erythema. Periwound temperature was noted as No Abnormality. Wound #7 status is Open. Original cause of wound was Shear/Friction. The wound is located on the Right,Anterior Lower Leg. The wound measures 0cm length x 0cm width x 0cm depth; 0cm^2 area and 0cm^3 volume. The wound is limited to skin breakdown. There is no tunneling or undermining noted. There is a none present amount of drainage noted. The wound margin is flat and intact. There is large (67-100%) red, pink granulation within the wound bed. There is no necrotic tissue within the wound bed. The periwound skin appearance exhibited: Dry/Scaly, Moist. Periwound temperature was noted as No Abnormality. The periwound has tenderness on palpation. 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 Skin Barriers/Peri-Wound Care: Wound #6 Left,Anterior Lower Leg: Barrier cream Primary Wound Dressing: Wound #6 Left,Anterior Lower Leg: Aquacel Ag Secondary Dressing: Wound #6 Left,Anterior Lower Leg: ABD pad Dry Gauze Wound #7 Right,Anterior Lower Leg: Boardered Foam Dressing Dressing Change Frequency: Wound #6 Left,Anterior Lower Leg: Change dressing every week Follow-up Appointments: Wound #6 Left,Anterior Lower Leg: Return Appointment in 1 week. Edema Control: Wound #6 Left,Anterior Lower Leg: 2 Layer Compression System - Left Lower Extremity Off-Loading: Wound #6 Left,Anterior Lower Leg: Turn and reposition every 2 hours - Stay off foot and try to keep boot off as long as possible. Follow-Up Appointments: A follow-up appointment should be scheduled. Medication Reconciliation completed and provided to Patient/Care Provider. A Patient Clinical Summary of Care was provided to TA Electronic Signature(s) Signed: 07/03/2016 5:44:16 PM By: [name] Lynch FNP Entered By: [name] [name] on 07/03/2016 14:21:13 [name] [name] ([id number]) -------------------------------------------------------------------------------- SuperBill Details Patient Name: [name] [name] Date of Service: 07/03/2016 Medical Record Number: [id number] Patient Account Number: [id number] Date of Birth/Sex: 09-18-1956 (60 y.o. 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 limited to breakdown of skin E11.42 Type 2 diabetes mellitus with diabetic polyneuropathy M14.672 Charcot's joint, left ankle and foot L97.211 Non-pressure chronic ulcer of right calf limited to breakdown of  skin Facility Procedures CPT4: Description Modifier Quantity Code [id number] (Facility Use Only) 29581LT - APPLY MULTLAY COMPRS LWR LT 1 LEG Physician Procedures CPT4 Code Description: [id number] 99213 - WC PHYS LEVEL 3 - EST PT ICD-10 Description Diagnosis L97.321 Non-pressure chronic ulcer of left ankle limited t L97.211 Non-pressure chronic ulcer of right calf limited t M14.672 Charcot's joint, left ankle and  foot E11.42 Type 2 diabetes mellitus with diabetic polyneuropa Modifier: o breakdown of o breakdown of thy Quantity: 1 skin skin Electronic Signature(s)  Signed: 07/03/2016 5:44:16 PM By: [name] Lynch FNP Entered By: [name] [name] on 07/03/2016 14:21:44

## 2016-07-04 NOTE — Progress Notes (Signed)
IZIC, STFORT ([id number]) Visit Report for 07/03/2016 Arrival Information Details Patient Name: [name] [name], [name] [name] Date of Service: 07/03/2016 1:30 PM Medical Record Number: [id number] Patient Account Number: [id number] Date of Birth/Sex: [birth year] (60 y.o. Male) Treating RN: Leonard Downing Primary Care Physician: FITZGERALD, [name] Other Clinician: Referring Physician: FITZGERALD, [name] Treating Physician/Extender: Eugene Garnet in Treatment: 2 Visit Information History Since Last Visit All ordered tests and consults were completed: No Patient Arrived: Wheel Chair Added or deleted any medications: No Arrival Time: 13:39 Any new allergies or adverse reactions: No Accompanied By: none Had a fall or experienced change in No activities of daily living that may affect Transfer Assistance: None risk of falls: Patient Identification Verified: Yes Signs or symptoms of abuse/neglect since last No Secondary Verification Process Yes visito Completed: Hospitalized since last visit: No Patient Requires Transmission-Based No Has Dressing in Place as Prescribed: Yes Precautions: Has Compression in Place as Prescribed: Yes Patient Has Alerts: No Pain Present Now: No Electronic Signature(s) Signed: 07/03/2016 4:21:40 PM By: Lucrezia Starch, RN, Sendra Entered By: Lucrezia Starch RN, Sendra on 07/03/2016 13:40:24 [name] [name] ([id number]) -------------------------------------------------------------------------------- Encounter Discharge Information Details Patient Name: [name] [name] Date of Service: 07/03/2016 1:30 PM Medical Record Number: [id number] Patient Account Number: [id number] Date of Birth/Sex: 1956-07-15 (60 y.o. Male) Treating RN: Leonard Downing Primary Care Physician: FITZGERALD, [name] Other Clinician: Referring Physician: [facility], [name] Treating Physician/Extender: Eugene Garnet in Treatment: 2 Encounter Discharge Information Items Discharge Pain Level: 0 Discharge  Condition: Stable Ambulatory Status: Wheelchair Discharge Destination: Home Transportation: Private Auto Accompanied By: self Schedule Follow-up Appointment: Yes Medication Reconciliation completed and provided to Patient/Care Yes Tiziana Cislo: Provided on Clinical Summary of Care: 07/03/2016 Form Type Recipient Paper Patient TA Electronic Signature(s) Signed: 07/03/2016 2:09:51 PM By: Gwenlyn Perking Entered By: Gwenlyn Perking on 07/03/2016 14:09:51 [name] [name] ([id number]) -------------------------------------------------------------------------------- Lower Extremity Assessment Details Patient Name: [name] [name] Date of Service: 07/03/2016 1:30 PM Medical Record Number: [id number] Patient Account Number: [id number] Date of Birth/Sex: September 24, 1956 (60 y.o. Male) Treating RN: Leonard Downing Primary Care Physician: FITZGERALD, [name] Other Clinician: Referring Physician: FITZGERALD, [name] Treating Physician/Extender: Eugene Garnet in Treatment: 2 Edema Assessment Assessed: [Left: No] [Right: No] E[Left: dema] [Right: :] Calf Left: Right: Point of Measurement: 36 cm From Medial Instep 39.5 cm cm Ankle Left: Right: Point of Measurement: 16 cm From Medial Instep 29 cm cm Vascular Assessment Pulses: Posterior Tibial Dorsalis Pedis Palpable: [Left:Yes] Extremity colors, hair growth, and conditions: Extremity Color: [Left:Normal] Hair Growth on Extremity: [Left:No] Temperature of Extremity: [Left:Warm] Capillary Refill: [Left:< 3 seconds] Toe Nail Assessment Left: Right: Thick: No Discolored: No Deformed: Yes Improper Length and Hygiene: No Electronic Signature(s) Signed: 07/03/2016 4:21:40 PM By: Lucrezia Starch, RN, Sendra Entered By: Lucrezia Starch RN, Sendra on 07/03/2016 13:48:00 [name] [name] ([id number]) -------------------------------------------------------------------------------- Pain Assessment Details Patient Name: [name] [name] Date of Service: 07/03/2016 1:30  PM Medical Record Number: [id number] Patient Account Number: [id number] Date of Birth/Sex: 10-21-1956 (60 y.o. Male) Treating RN: Leonard Downing Primary Care Physician: FITZGERALD, [name] Other Clinician: Referring Physician: FITZGERALD, [name] Treating Physician/Extender: Eugene Garnet in Treatment: 2 Active Problems Location of Pain Severity and Description of Pain Patient Has Paino No Site Locations Pain Management and Medication Current Pain Management: Electronic Signature(s) Signed: 07/03/2016 4:21:40 PM By: Lucrezia Starch RN, Sendra Entered By: Lucrezia Starch RN, Sendra on 07/03/2016 13:40:30 [name] [name] ([id number]) -------------------------------------------------------------------------------- Patient/Caregiver Education Details Patient Name: [name] [name] Date of Service: 07/03/2016 1:30 PM Medical Record Number: [id number] Patient Account Number: [id number] Date of Birth/Gender: [birth year] (60 y.o. Male) Treating RN: Leonard Downing  Primary Care Physician: FITZGERALD, [name] Other Clinician: Referring Physician: [facility], [name] Treating Physician/Extender: Eugene Garnet in Treatment: 2 Education Assessment Education Provided To: Patient Education Topics Provided Wound/Skin Impairment: Handouts: Caring for Your Ulcer, Skin Care Do's and Dont's Methods: Explain/Verbal Responses: State content correctly Electronic Signature(s) Signed: 07/03/2016 4:21:40 PM By: Lucrezia Starch, RN, Sendra Entered By: Lucrezia Starch RN, Sendra on 07/03/2016 14:08:18 [name] [name] ([id number]) -------------------------------------------------------------------------------- Wound Assessment Details Patient Name: [name] [name] Date of Service: 07/03/2016 1:30 PM Medical Record Number: [id number] Patient Account Number: [id number] Date of Birth/Sex: April 02, 1956 (60 y.o. Male) Treating RN: Leonard Downing Primary Care Physician: FITZGERALD, [name] Other Clinician: Referring Physician: FITZGERALD,  [name] Treating Physician/Extender: Eugene Garnet in Treatment: 2 Wound Status Wound Number: 6 Primary Diabetic Wound/Ulcer of the Lower Etiology: Extremity Wound Location: Left Lower Leg - Anterior Wound Open Wounding Event: Gradually Appeared Status: Date Acquired: 05/16/2016 Comorbid Cataracts, Anemia, Arrhythmia, Weeks Of Treatment: 2 History: Congestive Heart Failure, Coronary Clustered Wound: No Artery Disease, Hypertension, Myocardial Infarction, Type II Diabetes, End Stage Renal Disease, Osteoarthritis, Neuropathy Photos Photo Uploaded By: Lucrezia Starch RN, Rosalio Macadamia on 07/03/2016 14:47:56 Wound Measurements Length: (cm) 0.2 Width: (cm) 1.2 Depth: (cm) 0.1 Area: (cm) 0.188 Volume: (cm) 0.019 % Reduction in Area: 82.9% % Reduction in Volume: 99.1% Epithelialization: Small (1-33%) Tunneling: No Undermining: No Wound Description Classification: Grade 1 Foul Odor Aft Wound Margin: Distinct, outline attached Exudate Amount: Small Exudate Type: Serosanguineous Exudate Color: red, brown er Cleansing: No Wound Bed Granulation Amount: Large (67-100%) Exposed Structure [name] [name], [name] [name] ([id number]) Granulation Quality: Pink, Pale Fascia Exposed: No Necrotic Amount: None Present (0%) Fat Layer Exposed: No Tendon Exposed: No Muscle Exposed: No Joint Exposed: No Bone Exposed: No Limited to Skin Breakdown Periwound Skin Texture Texture Color No Abnormalities Noted: No No Abnormalities Noted: No Callus: No Atrophie Blanche: No Crepitus: No Cyanosis: No Excoriation: No Ecchymosis: No Fluctuance: No Erythema: No Friable: No Hemosiderin Staining: No Induration: No Mottled: No Localized Edema: Yes Pallor: No Rash: No Rubor: No Scarring: No Temperature / Pain Moisture Temperature: No Abnormality No Abnormalities Noted: No Dry / Scaly: Yes Maceration: No Moist: No Wound Preparation Ulcer Cleansing: Rinsed/Irrigated with Saline, Other: surg scrub with  water, Treatment Notes Wound #6 (Left, Anterior Lower Leg) 1. Cleansed with: Cleanse wound with antibacterial soap and water 4. Dressing Applied: Aquacel Ag 5. Secondary Dressing Applied ABD Pad Dry Gauze 7. Secured with 2 Layer Compression System - Left Lower Extremity Electronic Signature(s) Signed: 07/03/2016 4:21:40 PM By: Lucrezia Starch, RN, Sendra Entered By: Lucrezia Starch RN, Sendra on 07/03/2016 13:46:26 [name] [name] ([id number]) -------------------------------------------------------------------------------- Wound Assessment Details Patient Name: [name] [name] Date of Service: 07/03/2016 1:30 PM Medical Record Number: [id number] Patient Account Number: [id number] Date of Birth/Sex: 31-Aug-1956 (60 y.o. Male) Treating RN: Leonard Downing Primary Care Physician: FITZGERALD, [name] Other Clinician: Referring Physician: FITZGERALD, [name] Treating Physician/Extender: Eugene Garnet in Treatment: 2 Wound Status Wound Number: 7 Primary Diabetic Wound/Ulcer of the Lower Etiology: Extremity Wound Location: Right Lower Leg - Anterior Wound Open Wounding Event: Shear/Friction Status: Date Acquired: 06/12/2016 Comorbid Cataracts, Anemia, Arrhythmia, Weeks Of Treatment: 2 History: Congestive Heart Failure, Coronary Clustered Wound: No Artery Disease, Hypertension, Myocardial Infarction, Type II Diabetes, End Stage Renal Disease, Osteoarthritis, Neuropathy Photos Photo Uploaded By: Lucrezia Starch RN, Rosalio Macadamia on 07/03/2016 14:47:56 Wound Measurements Length: (cm) 0 % Reduction in Width: (cm) 0 % Reduction in Depth: (cm) 0 Epithelializat Area: (cm) 0 Tunneling: Volume: (cm) 0 Undermining: Area: 100% Volume: 100% ion: None No No Wound Description Classification: Grade 1 Wound Margin:  Flat and Intact Exudate Amount: None Present Wound Bed Granulation Amount: Large (67-100%) Exposed Structure Granulation Quality: Red, Pink Fascia Exposed: No Necrotic Amount: None Present  (0%) Fat Layer Exposed: No Urschel, Aidynn ([id number]) Tendon Exposed: No Muscle Exposed: No Joint Exposed: No Bone Exposed: No Limited to Skin Breakdown Periwound Skin Texture Texture Color No Abnormalities Noted: No No Abnormalities Noted: No Moisture Temperature / Pain No Abnormalities Noted: No Temperature: No Abnormality Dry / Scaly: Yes Tenderness on Palpation: Yes Moist: Yes Wound Preparation Ulcer Cleansing: Rinsed/Irrigated with Saline Topical Anesthetic Applied: None Treatment Notes Wound #7 (Right, Anterior Lower Leg) 1. Cleansed with: Clean wound with Normal Saline 5. Secondary Dressing Applied Bordered Foam Dressing Notes area healed, covered for protection Electronic Signature(s) Signed: 07/03/2016 4:21:40 PM By: Lucrezia [place], RN, Sendra Entered By: Lucrezia [place], RN, Sendra on 07/03/2016 13:46:45 [name] [name], [name] [name] ([id number]) -------------------------------------------------------------------------------- Vitals Details Patient Name: [name] [name], [name] [name] Date of Service: 07/03/2016 1:30 PM Medical Record Number: [id number] Patient Account Number: [id number] Date of Birth/Sex: 10/17/1956 [age] y.o. Male) Treating RN: Leonard Downing[place], Sendra Primary Care Physician: FITZGERALD, [name] Other Clinician: Referring Physician: FITZGERALD, [name] Treating Physician/Extender: Eugene GarnetSaunders, Sharon Weeks in Treatment: 2 Vital Signs Time Taken: 13:40 Temperature (F): 98.3 Height (in): 72 Pulse (bpm): 86 Blood Pressure (mmHg): 96/63 Reference Range: 80 - 120 mg / dl Electronic Signature(s) Signed: 07/03/2016 4:21:40 PM By: Lucrezia [place]arch[place], RN, Sendra Entered By: Lucrezia [place]arch[place], RN, Sendra on 07/03/2016 13:40:45

## 2016-07-10 ENCOUNTER — Encounter: Payer: Medicare Other | Admitting: Internal Medicine

## 2016-07-10 DIAGNOSIS — L97321 Non-pressure chronic ulcer of left ankle limited to breakdown of skin: Secondary | ICD-10-CM | POA: Diagnosis not present

## 2016-07-12 NOTE — Progress Notes (Signed)
[name] MuraBNEY, Gurfateh ([id number]) Visit Report for 07/10/2016 Chief Complaint Document Details Patient Name: [name] MuraBNEY, Early Date of Service: 07/10/2016 8:00 AM Medical Record Patient Account Number: [id number] Number: Treating RN: Clover MealyAfful, RN, BSN, Rita 10/31/1956 [phone](60 y.o. Other Clinician: Date of Birth/Sex: Male) Treating Keron Neenan Primary Care Physician: Sampson GoonFITZGERALD, [name] Physician/Extender: G Referring Physician: FITZGERALD, [name] Weeks in Treatment: 3 Information Obtained from: Patient Chief Complaint Right second toe wound and left lateral malleolar wound. is doing fine and has no fresh issues. He does have his appointment with the vascular surgeons. However he does not have an appointment with the orthopedic surgeons yet. he did not come for 2 weeks because of the bad weather. He has no fresh issues. 04/10/16; the patient returns today with a wound over the last 2-3 weeks on the anterior part of his left ankle. Electronic Signature(s) Signed: 07/11/2016 3:12:28 PM By: Baltazar Najjarobson, Harvey Lingo MD Entered By: Baltazar Najjarobson, Evon Dejarnett on 07/10/2016 08:36:33 [name] MuraABNEY, Radek ([id number]) -------------------------------------------------------------------------------- HPI Details Patient Name: [name] MuraABNEY, Jencarlos Date of Service: 07/10/2016 8:00 AM Medical Record Patient Account Number: [id number] Number: Treating RN: Clover MealyAfful, RN, BSN, Rita 11/17/1956 [phone](60 y.o. Other Clinician: Date of Birth/Sex: Male) Treating Ailey Wessling Primary Care Physician: Sampson GoonFITZGERALD, [name] Physician/Extender: G Referring Physician: FITZGERALD, [name] Weeks in Treatment: 3 History of Present Illness Location: left fourth toe Quality: Patient reports experiencing a dull pain to affected area(s). Severity: Patient states wound (s) are getting better. Duration: Patient states that they are not certain how long the wound has been present. Timing: Pain in wound is constant (hurts all the time) Context: The wound appeared  gradually over time Associated Signs and Symptoms: nil HPI Description: has been doing fine and has no fresh issues. He does say that he is trying to get all his appointments in order and at present he has a appointment with the vascular surgeons. While at home he removes his walking shoe and keeps the leg and foot open. There is no pain and minimal drainage. 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 He tells me that over the last 2-3 weeks he has noticed pain and a wound on the crease of his left dorsal ankle and he is here for our review events. Because of the deformity in his ankle he has a fracture boot that he uses the walker. [place] [facility] [place] [facility] he was in hospital in January and suffered a wide complex cardiac arrest at the time of the fistulagram. He has recovered from this. 04/17/16 a his left dorsal ankle appears to be improved. He has an appointment with [employer] to adjust his boot next week. He does not appear to have significant PAD but does have significant venous insufficiencywent 04/24/16; went to [employer] last week,did not adjust his boot due to edema. She has called his cardiologist and in the meantime they have apparently taken some extra fluid off him at dialysis. He dialyzes Monday Wednesday and Friday 05/01/16; the patient arrived with some scant surface eschar over his dorsal ankle wound. This was debridement. There is nothing but normal skin under this. His leg seems to have done well with a 2 layer compression we put on this  last week accompanied by extra fluid removal at dialysis's resulted in less edema of his leg. He has chronic venous insufficiency  with resultant edema been eating his chronic renal failure and deformity of his left ankle. 06/13/16; the patient arrives today with a recurrent wound in the same spot as last time. He has chronic venous insufficiency also deformity of the foot and ankle likely secondary to Charcot deformity. The patient has type 2 diabetes with polyneuropathy. He states the stockings he was wearing were too tight and he feels that's why it rubbed his ankle. He also has a Systems developer, that was adjusted last time. GAYLAN, FAUVER ([id number]) 06/19/16; Arrives today with a new wound on the right anterior leg. The original wound on the crease of his dorsal left ankle in the setting of chronic venous insufficiency and a Charcot deformity in this area is roughly the same. 06/27/16; the area on the right anterior leg is just about closed over, only 2 small areas remaining. The original wound in the crease of the dorsal left ankle in the setting of chronic venous insufficiency and a Charcot deformity appears improved. The debridement of adherent surface slough and nonviable subcutaneous tissue post debridement everything looks quite healthy here. 07/03/16: right lower leg wound healed. left lower leg wound improved. no reports of systemic s/s of infection. 07/10/16; right lower leg wound is healed. The area on his left anterior ankle covered in and eschar Electronic Signature(s) Signed: 07/11/2016 3:12:28 PM By: Baltazar Najjar MD Entered By: Baltazar Najjar on 07/10/2016 08:37:23 [name] [name] ([id number]) -------------------------------------------------------------------------------- Physical Exam Details Patient Name: [name] [name] Date of Service: 07/10/2016 8:00 AM Medical Record Patient Account Number: [id number] Number: Treating RN: Clover Mealy, RN, BSN, Rita September 05, 1956 [phone] y.o. Other Clinician: Date of Birth/Sex: Male) Treating Yeira Gulden Primary Care Physician: [name], [name] Physician/Extender:  G Referring Physician: FITZGERALD, [name] Weeks in Treatment: 3 Notes Wound exam; the area on the left anterior ankle had an eschar this was removed there is nothing but healthy epithelialization here. The wound is closed. Because of the position of his Charcot foot with inversion of the foot on the ankle I suspect this man will have some recurrent problems in the future. We have already had problems in quick succession here. Electronic Signature(s) Signed: 07/11/2016 3:12:28 PM By: Baltazar Najjar MD Entered By: Baltazar Najjar on 07/10/2016 08:38:19 [name] [name] ([id number]) -------------------------------------------------------------------------------- Physician Orders Details Patient Name: [name] [name] Date of Service: 07/10/2016 8:00 AM Medical Record Patient Account Number: [id number] Number: Treating RN: Clover Mealy, RN, BSN, Rita Sep 25, 1956 [phone] y.o. Other Clinician: Date of Birth/Sex: Male) Treating Julyanna Scholle Primary Care Physician: [name], [name] Physician/Extender: G Referring Physician: [name], [name] Weeks in Treatment: 3 Verbal / Phone Orders: Yes Clinician: Afful, RN, BSN, Aneth Sink Read Back and Verified: Yes Diagnosis Coding Discharge From [facility] Services o Discharge from Wound Care Center - Treatment Completed Electronic Signature(s) Signed: 07/10/2016 4:14:50 PM By: Elpidio Eric BSN, RN Signed: 07/11/2016 3:12:28 PM By: Baltazar Najjar MD Entered By: Elpidio Eric on 07/10/2016 08:24:23 [name] [name] ([id number]) -------------------------------------------------------------------------------- Problem List Details Patient Name: [name] [name] Date of Service: 07/10/2016 8:00 AM Medical Record Patient Account Number: [id number] Number: Treating RN: Clover Mealy, RN, BSN, Rita September 19, 1956 [phone] y.o. 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 limited to 06/13/2016 Yes breakdown of skin  E11.42 Type 2 diabetes mellitus with diabetic polyneuropathy 06/13/2016 Yes M14.672 Charcot's joint, left ankle and foot 06/13/2016 Yes L97.211 Non-pressure chronic ulcer of right calf limited to 06/19/2016 Yes breakdown of skin Inactive Problems Resolved Problems Electronic Signature(s) Signed: 07/11/2016 3:12:28 PM By: Baltazar Najjar MD Entered By: Baltazar Najjar on 07/10/2016 08:35:57 [name] [name] ([id number]) -------------------------------------------------------------------------------- Progress Note Details Patient Name: [name] [name] Date of Service: 07/10/2016 8:00 AM Medical Record Patient Account Number: [id number] Number: Treating RN: Clover Mealy, RN, BSN, Rita 1956/05/04 [phone] y.o. Other Clinician: Date of Birth/Sex: Male) Treating Rashena Dowling Primary Care Physician: [name], [name] Physician/Extender: G Referring Physician: FITZGERALD, [name] Weeks in Treatment: 3 Subjective Chief Complaint Information obtained from Patient Right second toe wound and left lateral malleolar wound. is doing fine and has no fresh issues. He does have his appointment with the vascular surgeons. However he does not have an appointment with the orthopedic surgeons yet. he did not come for 2 weeks because of the bad weather. He has no fresh issues. 04/10/16; the patient returns today with a wound over the last 2-3 weeks on the anterior part of his left ankle. History of Present Illness (HPI) The following HPI elements were documented for the patient's wound: Location: left fourth toe Quality: Patient reports experiencing a dull pain to affected area(s). Severity: Patient states wound (s) are getting better. Duration: Patient states that they are not certain how long the wound has  been present. Timing: Pain in wound is constant (hurts all the time) Context: The wound appeared gradually over time Associated Signs and Symptoms: nil has been doing fine and has no fresh issues. He does say that he is trying to get all his appointments in order and at present he has a appointment with the vascular surgeons. While at home he removes his walking shoe and keeps the leg and foot open. There is no pain and minimal drainage. 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 He tells me that over the last 2-3 weeks he has noticed pain and a wound on the crease of his left dorsal ankle and he is here for our review events. Because of the deformity in his ankle he has a fracture boot that he uses the walker. This was made at [employer] in [facility]. [facility] he was in hospital in January and suffered a wide complex cardiac arrest at the time of the fistulagram. He has recovered from this. 04/17/16 a his left dorsal ankle appears to be improved. He has an appointment with [employer] to adjust his boot next week. He does not appear to have significant PAD but does have significant venous insufficiencywent 04/24/16; went to [employer] last week,did not adjust his boot due to edema. She has called his cardiologist and in the meantime they have apparently taken some extra fluid off him at dialysis. He dialyzes Monday Wednesday and Friday RENOLD, KOZAR ([id number]) 05/01/16; the patient arrived with some scant surface eschar over his dorsal ankle wound. This was debridement. There is nothing but normal skin under this. His leg seems to have done well with a 2 layer compression we put on this last week accompanied  by extra fluid removal at dialysis's resulted  in less edema of his leg. He has chronic venous insufficiency with resultant edema been eating his chronic renal failure and deformity of his left ankle. 06/13/16; the patient arrives today with a recurrent wound in the same spot as last time. He has chronic venous insufficiency also deformity of the foot and ankle likely secondary to Charcot deformity. The patient has type 2 diabetes with polyneuropathy. He states the stockings he was wearing were too tight and he feels that's why it rubbed his ankle. He also has a Systems developer, that was adjusted last time. 06/19/16; Arrives today with a new wound on the right anterior leg. The original wound on the crease of his dorsal left ankle in the setting of chronic venous insufficiency and a Charcot deformity in this area is roughly the same. 06/27/16; the area on the right anterior leg is just about closed over, only 2 small areas remaining. The original wound in the crease of the dorsal left ankle in the setting of chronic venous insufficiency and a Charcot deformity appears improved. The debridement of adherent surface slough and nonviable subcutaneous tissue post debridement everything looks quite healthy here. 07/03/16: right lower leg wound healed. left lower leg wound improved. no reports of systemic s/s of infection. 07/10/16; right lower leg wound is healed. The area on his left anterior ankle covered in and eschar Objective Constitutional Vitals Time Taken: 8:03 AM, Height: 72 in, Temperature: 98.2 F, Pulse: 89 bpm, Respiratory Rate: 17 breaths/min, Blood Pressure: 105/76 mmHg. Integumentary (Hair, Skin) Wound #6 status is Healed - Epithelialized. Original cause of wound was Gradually Appeared. The wound is located on the Left,Anterior Lower Leg. The wound measures 0cm length x 0cm width x 0cm depth; 0cm^2 area and 0cm^3 volume. The wound is limited to skin breakdown. There is no tunneling  or undermining noted. There is a none present amount of drainage noted. The wound margin is distinct with the outline attached to the wound base. There is no granulation within the wound bed. There is no necrotic tissue within the wound bed. The periwound skin appearance exhibited: Localized Edema, Dry/Scaly. The periwound skin appearance did not exhibit: Callus, Crepitus, Excoriation, Fluctuance, Friable, Induration, Rash, Scarring, Maceration, Moist, Atrophie Blanche, Cyanosis, Ecchymosis, Hemosiderin Staining, Mottled, Pallor, Rubor, Erythema. Periwound temperature was noted as No Abnormality. Wound #7 status is Healed - Epithelialized. Original cause of wound was Shear/Friction. The wound is located on the Right,Anterior Lower Leg. The wound measures 0cm length x 0cm width x 0cm depth; 0cm^2 area and 0cm^3 volume. The wound is limited to skin breakdown. There is no tunneling or undermining noted. There is a none present amount of drainage noted. The wound margin is flat and intact. There is no granulation within the wound bed. There is no necrotic tissue within the wound bed. The periwound skin appearance exhibited: Dry/Scaly. The periwound skin appearance did not exhibit: Callus, Que, Teige ([id number]) Crepitus, Excoriation, Fluctuance, Friable, Induration, Localized Edema, Rash, Scarring, Maceration, Moist, Atrophie Blanche, Cyanosis, Ecchymosis, Hemosiderin Staining, Mottled, Pallor, Rubor, Erythema. Periwound temperature was noted as No Abnormality. 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 I think maintaining  closure in this area on his left ankle is going to be a matter of pressure/friction reduction. I have advised covering this with either a foam cover or a thick Band-Aid under his socket under his fracture boot. The patient expressed understanding Electronic Signature(s) Signed: 07/11/2016 3:12:28 PM By: Baltazar Najjar MD Entered By: Baltazar Najjar on 07/10/2016 08:39:22 [name] [name] ([id number]) -------------------------------------------------------------------------------- SuperBill Details Patient Name: [name] [name] Date of Service: 07/10/2016 Medical Record Patient Account Number: [id number] Number: Treating RN: Clover Mealy, RN, BSN, Rita [birth year] [phone] y.o. 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 breakdown of skin E11.42 Type 2 diabetes mellitus with diabetic polyneuropathy M14.672 Charcot's joint, left ankle and foot L97.211 Non-pressure chronic ulcer of right calf limited to breakdown of skin Facility Procedures CPT4 Code: [id number] Description: 86578 - WOUND CARE VISIT-LEV 2 EST PT Modifier: Quantity: 1 Physician Procedures CPT4 Code Description: [id number] - WC PHYS LEVEL 2 - EST PT ICD-10 Description Diagnosis L97.321 Non-pressure chronic ulcer of left ankle limited t Modifier: o breakdown of Quantity: 1 skin Electronic Signature(s) Signed: 07/11/2016 3:12:28 PM By: Baltazar Najjar MD Entered By: Baltazar Najjar on 07/10/2016 08:39:53

## 2016-07-12 NOTE — Progress Notes (Signed)
ADDISON, WHIDBEE ([id number]) Visit Report for 07/10/2016 Arrival Information Details Patient Name: [name] [name], [name] [name] Date of Service: 07/10/2016 8:00 AM Medical Record Number: [id number] Patient Account Number: [id number] Date of Birth/Sex: Feb 06, 1956 (60 y.o. Male) Treating RN: [name] Mealy, RN, BSN, [facility] Primary Care Physician: [name] [name] Other Clinician: Referring Physician: FITZGERALD, [name] Treating Physician/Extender: [name] [name] in Treatment: 3 Visit Information History Since Last Visit Added or deleted any medications: No Patient Arrived: Wheel Chair Any new allergies or adverse reactions: No Arrival Time: 08:01 Had a fall or experienced change in No activities of daily living that may affect Accompanied By: self risk of falls: Transfer Assistance: None Signs or symptoms of abuse/neglect since last No Patient Identification Verified: Yes visito Secondary Verification Process Yes Hospitalized since last visit: No Completed: Has Dressing in Place as Prescribed: Yes Patient Requires Transmission-Based No Has Compression in Place as Prescribed: Yes Precautions: Pain Present Now: No Patient Has Alerts: No Electronic Signature(s) Signed: 07/10/2016 4:14:50 PM By: [name] [name] BSN, RN Entered By: [name] [name] on 07/10/2016 08:03:24 [name] [name] ([id number]) -------------------------------------------------------------------------------- Clinic Level of Care Assessment Details Patient Name: [name] [name] Date of Service: 07/10/2016 8:00 AM Medical Record Number: [id number] Patient Account Number: [id number] Date of Birth/Sex: [birth year] (60 y.o. Male) Treating RN: [name] Mealy, RN, BSN, Rita Primary Care Physician: [name] [name] Other Clinician: Referring Physician: FITZGERALD, [name] Treating Physician/Extender: [name] [name] in Treatment: 3 Clinic Level of Care Assessment Items TOOL 4 Quantity Score []  - Use when only an EandM is performed on FOLLOW-UP visit  0 ASSESSMENTS - Nursing Assessment / Reassessment X - Reassessment of Co-morbidities (includes updates in patient status) 1 10 X - Reassessment of Adherence to Treatment Plan 1 5 ASSESSMENTS - Wound and Skin Assessment / Reassessment X - Simple Wound Assessment / Reassessment - one wound 1 5 []  - Complex Wound Assessment / Reassessment - multiple wounds 0 []  - Dermatologic / Skin Assessment (not related to wound area) 0 ASSESSMENTS - Focused Assessment []  - Circumferential Edema Measurements - multi extremities 0 []  - Nutritional Assessment / Counseling / Intervention 0 X - Lower Extremity Assessment (monofilament, tuning fork, pulses) 1 5 []  - Peripheral Arterial Disease Assessment (using hand held doppler) 0 ASSESSMENTS - Ostomy and/or Continence Assessment and Care []  - Incontinence Assessment and Management 0 []  - Ostomy Care Assessment and Management (repouching, etc.) 0 PROCESS - Coordination of Care X - Simple Patient / Family Education for ongoing care 1 15 []  - Complex (extensive) Patient / Family Education for ongoing care 0 []  - Staff obtains Chiropractor, Records, Test Results / Process Orders 0 []  - Staff telephones HHA, Nursing Homes / Clarify orders / etc 0 []  - Routine Transfer to another Facility (non-emergent condition) 0 [name] [name] ([id number]) []  - Routine Hospital Admission (non-emergent condition) 0 []  - New Admissions / Manufacturing engineer / Ordering NPWT, Apligraf, etc. 0 []  - Emergency Hospital Admission (emergent condition) 0 []  - Simple Discharge Coordination 0 []  - Complex (extensive) Discharge Coordination 0 PROCESS - Special Needs []  - Pediatric / Minor Patient Management 0 []  - Isolation Patient Management 0 []  - Hearing / Language / Visual special needs 0 []  - Assessment of Community assistance (transportation, D/C planning, etc.) 0 []  - Additional assistance / Altered mentation 0 []  - Support Surface(s) Assessment (bed, cushion, seat, etc.)  0 INTERVENTIONS - Wound Cleansing / Measurement X - Simple Wound Cleansing - one wound 1 5 []  - Complex Wound Cleansing - multiple wounds 0 X - Wound Imaging (  photographs - any number of wounds) 1 5 []  - Wound Tracing (instead of photographs) 0 []  - Simple Wound Measurement - one wound 0 []  - Complex Wound Measurement - multiple wounds 0 INTERVENTIONS - Wound Dressings []  - Small Wound Dressing one or multiple wounds 0 []  - Medium Wound Dressing one or multiple wounds 0 []  - Large Wound Dressing one or multiple wounds 0 []  - Application of Medications - topical 0 []  - Application of Medications - injection 0 INTERVENTIONS - Miscellaneous []  - External ear exam 0 [name] [name] ([id number]) []  - Specimen Collection (cultures, biopsies, blood, body fluids, etc.) 0 []  - Specimen(s) / Culture(s) sent or taken to Lab for analysis 0 []  - Patient Transfer (multiple staff / Michiel SitesHoyer Lift / Similar devices) 0 []  - Simple Staple / Suture removal (25 or less) 0 []  - Complex Staple / Suture removal (26 or more) 0 []  - Hypo / Hyperglycemic Management (close monitor of Blood Glucose) 0 []  - Ankle / Brachial Index (ABI) - do not check if billed separately 0 X - Vital Signs 1 5 Has the patient been seen at the hospital within the last three years: Yes Total Score: 55 Level Of Care: New/Established - Level 2 Electronic Signature(s) Signed: 07/10/2016 4:14:50 PM By: [name] EricAfful, Rita BSN, RN Entered By: [name] EricAfful, Rita on 07/10/2016 08:24:58 [name] [name] ([id number]) -------------------------------------------------------------------------------- Encounter Discharge Information Details Patient Name: [name] [name] Date of Service: 07/10/2016 8:00 AM Medical Record Number: [id number] Patient Account Number: [id number] Date of Birth/Sex: 07/26/1956 [age] y.o. Male) Treating RN: [name] MealyAfful, RN, BSN, Rita Primary Care Physician: [name] [name] Other Clinician: Referring Physician: FITZGERALD, [name] Treating  Physician/Extender: [name] CarolinaOBSON, MICHAEL G Weeks in Treatment: 3 Encounter Discharge Information Items Discharge Pain Level: 0 Discharge Condition: Stable Ambulatory Status: Wheelchair Discharge Destination: Home Transportation: Other Accompanied By: self Schedule Follow-up Appointment: No Medication Reconciliation completed and provided to Patient/Care No Verma Grothaus: Provided on Clinical Summary of Care: 07/10/2016 Form Type Recipient Paper Patient TA Electronic Signature(s) Signed: 07/10/2016 8:26:08 AM By: Gwenlyn PerkingMoore, Shelia Entered By: Gwenlyn PerkingMoore, Shelia on 07/10/2016 08:26:08 [name] [name] ([id number]) -------------------------------------------------------------------------------- Lower Extremity Assessment Details Patient Name: [name] [name] Date of Service: 07/10/2016 8:00 AM Medical Record Number: [id number] Patient Account Number: [id number] Date of Birth/Sex: 04/13/1956 [age] y.o. Male) Treating RN: [name] MealyAfful, RN, BSN, Rita Primary Care Physician: [name] [name] Other Clinician: Referring Physician: FITZGERALD, [name] Treating Physician/Extender: Maxwell CaulOBSON, MICHAEL G Weeks in Treatment: 3 Edema Assessment Assessed: [Left: No] [Right: No] E[Left: dema] [Right: :] Calf Left: Right: Point of Measurement: 36 cm From Medial Instep 38.9 cm cm Ankle Left: Right: Point of Measurement: 16 cm From Medial Instep 28.8 cm cm Vascular Assessment Pulses: Posterior Tibial Dorsalis Pedis Palpable: [Left:Yes] Extremity colors, hair growth, and conditions: Extremity Color: [Left:Dusky] Hair Growth on Extremity: [Left:No] Temperature of Extremity: [Left:Warm] Capillary Refill: [Left:< 3 seconds] Electronic Signature(s) Signed: 07/10/2016 4:14:50 PM By: [name] EricAfful, Rita BSN, RN Entered By: [name] EricAfful, Rita on 07/10/2016 08:06:02 [name] [name] ([id number]) -------------------------------------------------------------------------------- Multi Wound Chart Details Patient Name: [name] [name] Date of Service:  07/10/2016 8:00 AM Medical Record Number: [id number] Patient Account Number: [id number] Date of Birth/Sex: 12/17/1956 [age] y.o. 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] [7:the Lower Extremity] Comorbid History: [6:Cataracts, Anemia, Arrhythmia, Congestive Arrhythmia, Congestive Heart Failure, Coronary Heart Failure, Coronary Artery Disease, Hypertension, Myocardial Hypertension, Myocardial Infarction, Type II Diabetes, End Stage Renal Disease,  Osteoarthritis, Neuropathy Osteoarthritis, Neuropathy] [7:Cataracts, Anemia, Artery Disease, Infarction, Type II Diabetes, End Stage Renal Disease,] [N/A:N/A] Date Acquired: [6:05/16/2016] [7:06/12/2016] [N/A:N/A] Weeks of Treatment: [6:3] [7:3] [N/A:N/A] Wound Status: [6:Healed - Epithelialized] [7:Healed - Epithelialized] [N/A:N/A] Measurements L x W x D 0x0x0 [7:0x0x0] [N/A:N/A] (cm) Area (cm) : [6:0] [7:0] [N/A:N/A] Volume (cm) : [6:0] [7:0] [N/A:N/A] % Reduction in Area: [6:100.00%] [7:100.00%] [N/A:N/A] % Reduction in Volume: 100.00% [7:100.00%] [N/A:N/A] Classification: [6:Grade 1] [7:Grade 1] [N/A:N/A] Exudate Amount: [6:None Present] [7:None Present] [N/A:N/A] Wound Margin: [6:Distinct, outline attached Flat and Intact] [N/A:N/A] Granulation Amount: [6:None Present (0%)] [7:None Present (0%)] [N/A:N/A] Necrotic Amount: [6:None  Present (0%)] [7:None Present (0%)] [N/A:N/A] Exposed Structures: [6:Fascia: No Fat: No] [7:Fascia: No Fat: No] [N/A:N/A] Tendon: No Tendon: No Muscle: No Muscle: No Joint: No Joint: No Bone: No Bone: No Limited to Skin Limited to Skin Breakdown Breakdown Epithelialization: Large (67-100%) None N/A Periwound Skin Texture: Edema: Yes Edema: No N/A Excoriation: No Excoriation: No Induration: No Induration: No Callus: No Callus: No Crepitus: No Crepitus: No Fluctuance: No Fluctuance: No Friable: No Friable: No Rash: No Rash: No Scarring: No Scarring: No Periwound Skin Dry/Scaly: Yes Dry/Scaly: Yes N/A Moisture: Maceration: No Maceration: No Moist: No Moist: No Periwound Skin Color: Atrophie Blanche: No Atrophie Blanche: No N/A Cyanosis: No Cyanosis: No Ecchymosis: No Ecchymosis: No Erythema: No Erythema: No Hemosiderin Staining: No Hemosiderin Staining: No Mottled: No Mottled: No Pallor: No Pallor: No Rubor: No Rubor: No Temperature: No Abnormality No Abnormality N/A Tenderness on No No N/A Palpation: Wound Preparation: Ulcer Cleansing: Ulcer Cleansing: N/A Rinsed/Irrigated with Rinsed/Irrigated with Saline, Other: surg scrub Saline with water Topical Anesthetic Topical Anesthetic Applied: None Applied: None Treatment Notes Electronic Signature(s) Signed: 07/10/2016 4:14:50 PM By: [name] [name] BSN, RN Entered By: [name] [name] on 07/10/2016 08:23:54 [name] [name] ([id number]) -------------------------------------------------------------------------------- Multi-Disciplinary Care Plan Details Patient Name: [name] [name] Date of Service: 07/10/2016 8:00 AM Medical Record Number: [id number] Patient Account Number: [id number] Date of Birth/Sex: January 08, 1956 (60 y.o. Male) Treating RN: [name] Mealy, RN, BSN, Rita Primary Care Physician: [name] [name] Other Clinician: Referring Physician: FITZGERALD, [name] Treating Physician/Extender: [name] [place]  in Treatment: 3 Active Inactive Electronic Signature(s) Signed: 07/10/2016 4:14:50 PM By: [name] [name] BSN, RN Entered By: [name] [name] on 07/10/2016 08:23:41 [name] [name] ([id number]) -------------------------------------------------------------------------------- Pain Assessment Details Patient Name: [name] [name] Date of Service: 07/10/2016 8:00 AM Medical Record Number: [id number] Patient Account Number: [id number] Date of Birth/Sex: [birth year] (60 y.o. Male) Treating RN: [name] Mealy, RN, BSN, Rita Primary Care Physician: [name] [name] Other Clinician: Referring Physician: FITZGERALD, [name] Treating Physician/Extender: Maxwell Caul Weeks in Treatment: 3 Active Problems Location of Pain Severity and Description of Pain Patient Has Paino No Site Locations Pain Management and Medication Current Pain Management: Electronic Signature(s) Signed: 07/10/2016 4:14:50 PM By: [name] [name] BSN, RN Entered By: [name] [name] on 07/10/2016 08:03:42 [name] [name] ([id number]) -------------------------------------------------------------------------------- Patient/Caregiver Education Details Patient Name: [name] [name] Date of Service: 07/10/2016 8:00 AM Medical Record Number: [id number] Patient Account Number: [id number] Date of Birth/Gender: 05-Oct-1956 (60 y.o. Male) Treating RN: [name] Mealy, RN, BSN, Rita Primary Care Physician: [name] [name] Other Clinician: Referring Physician: FITZGERALD, [name] Treating Physician/Extender: [name] [place] in Treatment: 3 Education Assessment Education Provided  To: Patient Education Topics Provided Electronic Signature(s) Signed: 07/10/2016 4:14:50 PM By: [name] [name] BSN, RN Entered By: [name] [name] on 07/10/2016 08:25:47 [name] [name] ([id number]) -------------------------------------------------------------------------------- Wound Assessment Details Patient Name: [name] [name] Date of Service: 07/10/2016 8:00 AM Medical Record Number:  [id number] Patient Account Number: [id number] Date of Birth/Sex: 27-Sep-1956 (60 y.o. Male) Treating RN: [name] Mealy, RN, BSN, Rita Primary Care Physician: [name] [name] Other Clinician: Referring Physician: FITZGERALD, [name] Treating Physician/Extender: Maxwell Caul Weeks in Treatment: 3 Wound Status Wound Number: 6 Primary Diabetic Wound/Ulcer of the Lower Etiology: Extremity Wound Location: Left Lower Leg - Anterior Wound Healed - Epithelialized Wounding Event: Gradually Appeared Status: Date Acquired: 05/16/2016 Comorbid Cataracts, Anemia, Arrhythmia, Weeks Of Treatment: 3 History: Congestive Heart Failure, Coronary Clustered Wound: No Artery Disease, Hypertension, Myocardial Infarction, Type II Diabetes, End Stage Renal Disease, Osteoarthritis, Neuropathy Photos Photo Uploaded By: [name] [name] on 07/10/2016 16:11:48 Wound Measurements Length: (cm) 0 % Reducti Width: (cm) 0 % Reducti Depth: (cm) 0 Epithelia Area: (cm) 0 Tunnelin Volume: (cm) 0 Undermin on in Area: 100% on in Volume: 100% lization: Large (67-100%) g: No ing: No Wound Description Classification: Grade 1 Wound Margin: Distinct, outline attached Exudate Amount: None Present Foul Odor After Cleansing: No Wound Bed Granulation Amount: None Present (0%) Exposed Structure Necrotic Amount: None Present (0%) Fascia Exposed: No Fat Layer Exposed: No Cothern, Pearley ([id number]) Tendon Exposed: No Muscle Exposed: No Joint Exposed: No Bone Exposed: No Limited to Skin Breakdown Periwound Skin Texture Texture Color No Abnormalities Noted: No No Abnormalities Noted: No Callus: No Atrophie Blanche: No Crepitus: No Cyanosis: No Excoriation: No Ecchymosis: No Fluctuance: No Erythema: No Friable: No Hemosiderin Staining: No Induration: No Mottled: No Localized Edema: Yes Pallor: No Rash: No Rubor: No Scarring: No Temperature / Pain Moisture Temperature: No Abnormality No Abnormalities Noted:  No Dry / Scaly: Yes Maceration: No Moist: No Wound Preparation Ulcer Cleansing: Rinsed/Irrigated with Saline, Other: surg scrub with water, Topical Anesthetic Applied: None Electronic Signature(s) Signed: 07/10/2016 4:14:50 PM By: [name] [name] BSN, RN Entered By: [name] [name] on 07/10/2016 08:20:55 [name] [name] ([id number]) -------------------------------------------------------------------------------- Wound Assessment Details Patient Name: [name] [name] Date of Service: 07/10/2016 8:00 AM Medical Record Number: [id number] Patient Account Number: [id number] Date of Birth/Sex: [birth year] (60 y.o. Male) Treating RN: [name] Mealy, RN, BSN, Rita Primary Care Physician: [name] [name] Other Clinician: Referring Physician: FITZGERALD, [name] Treating Physician/Extender: Maxwell Caul Weeks in Treatment: 3 Wound Status Wound Number: 7 Primary Diabetic Wound/Ulcer of the Lower Etiology: Extremity Wound Location: Right Lower Leg - Anterior Wound Healed - Epithelialized Wounding Event: Shear/Friction Status: Date Acquired: 06/12/2016 Comorbid Cataracts, Anemia, Arrhythmia, Weeks Of Treatment: 3 History: Congestive Heart Failure, Coronary Clustered Wound: No Artery Disease, Hypertension, Myocardial Infarction, Type II Diabetes, End Stage Renal Disease, Osteoarthritis, Neuropathy Photos Photo Uploaded By: [name] [name] on 07/10/2016 16:11:48 Wound Measurements Length: (cm) 0 % Reduction Width: (cm) 0 % Reduction Depth: (cm) 0 Epitheliali Area: (cm) 0 Tunneling: Volume: (cm) 0 Underminin in Area: 100% in Volume: 100% zation: None No g: No Wound Description Classification: Grade 1 Wound Margin: Flat and Intact Exudate Amount: None Present Wound Bed Granulation Amount: None Present (0%) Exposed Structure Necrotic Amount: None Present (0%) Fascia Exposed: No Fat Layer Exposed: No Mcgrady, Kennen ([id number]) Tendon Exposed: No Muscle Exposed: No Joint Exposed: No Bone Exposed:  No Limited to Skin Breakdown Periwound Skin Texture Texture Color No Abnormalities Noted: No No Abnormalities Noted: No Callus: No Atrophie Blanche: No Crepitus: No Cyanosis: No Excoriation: No Ecchymosis: No Fluctuance:  No Erythema: No Friable: No Hemosiderin Staining: No Induration: No Mottled: No Localized Edema: No Pallor: No Rash: No Rubor: No Scarring: No Temperature / Pain Moisture Temperature: No Abnormality No Abnormalities Noted: No Dry / Scaly: Yes Maceration: No Moist: No Wound Preparation Ulcer Cleansing: Rinsed/Irrigated with Saline Topical Anesthetic Applied: None Electronic Signature(s) Signed: 07/10/2016 4:14:50 PM By: [name] [name] BSN, RN Entered By: [name] [name] on 07/10/2016 08:21:32 [name] [name] ([id number]) -------------------------------------------------------------------------------- Vitals Details Patient Name: [name] [name] Date of Service: 07/10/2016 8:00 AM Medical Record Number: [id number] Patient Account Number: [id number] Date of Birth/Sex: 1956-09-28 (60 y.o. Male) Treating RN: [name] Mealy, RN, BSN, Rita Primary Care Physician: [name] [name] Other Clinician: Referring Physician: FITZGERALD, [name] Treating Physician/Extender: Maxwell Caul Weeks in Treatment: 3 Vital Signs Time Taken: 08:03 Temperature (F): 98.2 Height (in): 72 Pulse (bpm): 89 Respiratory Rate (breaths/min): 17 Blood Pressure (mmHg): 105/76 Reference Range: 80 - 120 mg / dl Electronic Signature(s) Signed: 07/10/2016 4:14:50 PM By: [name] [name] BSN, RN Entered By: [name] [name] on 07/10/2016 08:04:08

## 2016-07-31 ENCOUNTER — Encounter: Payer: Medicare Other | Attending: Surgery | Admitting: Surgery

## 2016-07-31 DIAGNOSIS — I132 Hypertensive heart and chronic kidney disease with heart failure and with stage 5 chronic kidney disease, or end stage renal disease: Secondary | ICD-10-CM | POA: Insufficient documentation

## 2016-07-31 DIAGNOSIS — N186 End stage renal disease: Secondary | ICD-10-CM | POA: Diagnosis not present

## 2016-07-31 DIAGNOSIS — D631 Anemia in chronic kidney disease: Secondary | ICD-10-CM | POA: Diagnosis not present

## 2016-07-31 DIAGNOSIS — M14672 Charcot's joint, left ankle and foot: Secondary | ICD-10-CM | POA: Insufficient documentation

## 2016-07-31 DIAGNOSIS — Z992 Dependence on renal dialysis: Secondary | ICD-10-CM | POA: Diagnosis not present

## 2016-07-31 DIAGNOSIS — E1142 Type 2 diabetes mellitus with diabetic polyneuropathy: Secondary | ICD-10-CM | POA: Diagnosis not present

## 2016-07-31 DIAGNOSIS — I252 Old myocardial infarction: Secondary | ICD-10-CM | POA: Insufficient documentation

## 2016-07-31 DIAGNOSIS — L97322 Non-pressure chronic ulcer of left ankle with fat layer exposed: Secondary | ICD-10-CM | POA: Insufficient documentation

## 2016-07-31 DIAGNOSIS — E1122 Type 2 diabetes mellitus with diabetic chronic kidney disease: Secondary | ICD-10-CM | POA: Insufficient documentation

## 2016-07-31 DIAGNOSIS — I509 Heart failure, unspecified: Secondary | ICD-10-CM | POA: Insufficient documentation

## 2016-07-31 NOTE — Progress Notes (Signed)
JHOAN, SCHMIEDER ([id number]) Visit Report for 07/31/2016 Chief Complaint Document Details Patient Name: [name], [name] Date of Service: 07/31/2016 3:15 PM Medical Record Number: [id number] Patient Account Number: [id number] Date of Birth/Sex: 01-17-1956 (60 y.o. Male) Treating RN: Clover Mealy, RN, BSN, [facility] Primary Care Physician: FITZGERALD, [name] Other Clinician: Referring Physician: FITZGERALD, [name] Treating Physician/Extender: Rudene Re in Treatment: 6 Information Obtained from: Patient Chief Complaint Right second toe wound and left lateral malleolar wound. is doing fine and has no fresh issues. He does have his appointment with the vascular surgeons. However he does not have an appointment with the orthopedic surgeons yet. he did not come for 2 weeks because of the bad weather. He has no fresh issues. 04/10/16; the patient returns today with a wound over the last 2-3 weeks on the anterior part of his left ankle. 07/31/2016 -- the patient was recently discharged 3 weeks ago and comes back with a reopened wound the left anterior ankle has been there for about 2 weeks Electronic Signature(s) Signed: 07/31/2016 3:57:18 PM By: Evlyn Kanner MD, FACS Previous Signature: 07/31/2016 3:37:26 PM Version By: Evlyn Kanner MD, FACS Previous Signature: 07/31/2016 3:35:13 PM Version By: Evlyn Kanner MD, FACS Entered By: Evlyn Kanner on 07/31/2016 15:57:17 [name] Mura ([id number]) -------------------------------------------------------------------------------- HPI Details Patient Name: [name] Mura Date of Service: 07/31/2016 3:15 PM Medical Record Number: [id number] Patient Account Number: [id number] Date of Birth/Sex: [birth year] (60 y.o. Male) Treating RN: Clover Mealy, RN, BSN, Rita Primary Care Physician: FITZGERALD, [name] Other Clinician: Referring Physician: FITZGERALD, [name] Treating Physician/Extender: Rudene Re in Treatment: 6 History of Present Illness Location: left fourth toe Quality:  Patient reports experiencing a dull pain to affected area(s). Severity: Patient states wound (s) are getting better. Duration: Patient states that they are not certain how long the wound has been present. Timing: Pain in wound is constant (hurts all the time) Context: The wound appeared gradually over time Associated Signs and Symptoms: nil HPI Description: has been doing fine and has no fresh issues. He does say that he is trying to get all his appointments in order and at present he has a appointment with the vascular surgeons. While at home he removes his walking shoe and keeps the leg and foot open. There is no pain and minimal drainage. 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 He tells me that over the last 2-3 weeks he has noticed pain and a wound on the crease of his left dorsal ankle and he is here for our review events. Because of the deformity in his ankle he has a fracture boot that he uses the walker. This was made at [facility] in [facility]. [facility] he was in hospital in January and suffered a wide complex cardiac arrest at the time of the fistulagram. He has recovered from this. 04/17/16 a his left dorsal ankle appears to be improved. He has an appointment with [facility] to adjust his boot next week. He does not appear to have significant PAD but does have significant venous insufficiencywent 04/24/16; went to [facility]otech last week,did not adjust his boot due to edema. She has called his cardiologist and in the meantime they have apparently taken some extra fluid off him  at dialysis. He dialyzes Monday Wednesday and Friday 05/01/16; the patient arrived with some scant surface eschar over his  dorsal ankle wound. This was debridement. There is nothing but normal skin under this. His leg seems to have done well with a 2 layer compression we put on this last week accompanied by extra fluid removal at dialysis's resulted in less edema of his leg. He has chronic venous insufficiency with resultant edema been eating his chronic renal failure and deformity of his left ankle. 06/13/16; the patient arrives today with a recurrent wound in the same spot as last time. He has chronic venous insufficiency also deformity of the foot and ankle likely secondary to Charcot deformity. The patient has type 2 diabetes with polyneuropathy. He states the stockings he was wearing were too tight and he feels that's why it rubbed his ankle. He also has a Systems developerCharcot boot, that was adjusted last time. 06/19/16; Arrives today with a new wound on the right anterior leg. The original wound on the crease of his dorsal left ankle in the setting of chronic venous insufficiency and a Charcot deformity in this area is roughly Fuhrman, Burle ([id number]) the same. 06/27/16; the area on the right anterior leg is just about closed over, only 2 small areas remaining. The original wound in the crease of the dorsal left ankle in the setting of chronic venous insufficiency and a Charcot deformity appears improved. The debridement of adherent surface slough and nonviable subcutaneous tissue post debridement everything looks quite healthy here. 07/03/16: right lower leg wound healed. left lower leg wound improved. no reports of systemic s/s of infection. 07/10/16; right lower leg wound is healed. The area on his left anterior ankle covered in and eschar. 07/31/2016 -- the patient was recently discharged in the middle of July returns in about 3 weeks with a fresh wound which has reopened. He is a diabetic with chronic venous insufficiency and also a Charcot foot. he says he started wearing compression stockings which have caused him an  abrasion on the deformed ankle. Not find any evidence of venous insufficiency in the past. Review of his vascular studies done in June 2016 showed a right ABI of 1.15 and a left ABI of 1.11 Electronic Signature(s) Signed: 07/31/2016 3:58:20 PM By: Evlyn KannerBritto, Nattalie Santiesteban MD, FACS Previous Signature: 07/31/2016 3:40:41 PM Version By: Evlyn KannerBritto, Shakenya Stoneberg MD, FACS Previous Signature: 07/31/2016 3:36:49 PM Version By: Evlyn KannerBritto, Gurpreet Mikhail MD, FACS Entered By: Evlyn KannerBritto, Aslee Such on 07/31/2016 15:58:19 [name] [name], [name] [name] ([id number]) -------------------------------------------------------------------------------- Physical Exam Details Patient Name: [name] [name], [name] [name] Date of Service: 07/31/2016 3:15 PM Medical Record Number: [id number] Patient Account Number: [id number] Date of Birth/Sex: 03/07/1956 9(59 y.o. Male) Treating RN: Clover MealyAfful, RN, BSN, Rita Primary Care Physician: FITZGERALD, [name] Other Clinician: Referring Physician: FITZGERALD, [name] Treating Physician/Extender: Rudene ReBritto, Zynasia Burklow Weeks in Treatment: 6 Constitutional . Pulse regular. Respirations normal and unlabored. Afebrile. . Eyes Nonicteric. Reactive to light. Ears, Nose, Mouth, and Throat Lips, teeth, and gums WNL.Marland Kitchen. Moist mucosa without lesions. Neck supple and nontender. No palpable supraclavicular or cervical adenopathy. Normal sized without goiter. Respiratory WNL. No retractions.. Breath sounds WNL, No rubs, rales, rhonchi, or wheeze.. Cardiovascular Heart rhythm and rate regular, no murmur or gallop.. Pedal Pulses WNL. No clubbing, cyanosis or edema. Chest Breasts symmetical and no nipple discharge.. Breast tissue WNL, no masses, lumps, or tenderness.. Gastrointestinal (GI) Abdomen without masses or tenderness.. No liver or spleen enlargement or tenderness.. Genitourinary (GU) No hydrocele, spermatocele, tenderness of the cord, or testicular mass.Marland Kitchen. Penis without lesions.Renetta Chalk. Genitalia without lesions.  No cystocele, or rectocele. Pelvic support intact, no discharge.Marland Kitchen  Urethra without masses, tenderness or scarring.Marland Kitchen Lymphatic No adneopathy. No adenopathy. No adenopathy. Musculoskeletal Adexa without tenderness or enlargement.. Digits and nails w/o clubbing, cyanosis, infection, petechiae, ischemia, or inflammatory conditions.. Integumentary (Hair, Skin) No suspicious lesions. No crepitus or fluctuance. No peri-wound warmth or erythema. No masses.Marland Kitchen Psychiatric Judgement and insight Intact.. No evidence of depression, anxiety, or agitation.. Notes there is a reopened wound on the left anterior ankle in the skin crease and he has a significantly deformed foot. Debridement was not required but a moist saline gauze was able to remove a lot of debris which was superficial. Rauber, Mycheal ([id number]) Electronic Signature(s) Signed: 07/31/2016 3:59:23 PM By: Evlyn Kanner MD, FACS Entered By: Evlyn Kanner on 07/31/2016 15:59:22 [name] Mura ([id number]) -------------------------------------------------------------------------------- Physician Orders Details Patient Name: [name] Mura Date of Service: 07/31/2016 3:15 PM Medical Record Number: [id number] Patient Account Number: [id number] Date of Birth/Sex: May 17, 1956 (60 y.o. Male) Treating RN: Curtis Sites Primary Care Physician: FITZGERALD, [name] Other Clinician: Referring Physician: FITZGERALD, [name] Treating Physician/Extender: Rudene Re in Treatment: 6 Verbal / Phone Orders: Yes Clinician: Curtis Sites Read Back and Verified: Yes Diagnosis Coding ICD-10 Coding Code Description L97.321 Non-pressure chronic ulcer of left ankle limited to breakdown of skin E11.42 Type 2 diabetes mellitus with diabetic polyneuropathy M14.672 Charcot's joint, left ankle and foot L97.211 Non-pressure chronic ulcer of right calf limited to breakdown of skin Wound Cleansing Wound #8 Left,Anterior Lower Leg o Clean wound with Normal Saline. o May Shower, gently pat wound dry prior to applying new  dressing. Anesthetic Wound #8 Left,Anterior Lower Leg o Topical Lidocaine 4% cream applied to wound bed prior to debridement Skin Barriers/Peri-Wound Care Wound #8 Left,Anterior Lower Leg o Skin Prep Primary Wound Dressing Wound #8 Left,Anterior Lower Leg o Santyl Ointment Secondary Dressing Wound #8 Left,Anterior Lower Leg o Boardered Foam Dressing Dressing Change Frequency Wound #8 Left,Anterior Lower Leg o Change dressing every day. Follow-up Appointments Wound #8 Left,Anterior Lower Leg Terpening, Zinedine ([id number]) o Return Appointment in 1 week. Edema Control Wound #8 Left,Anterior Lower Leg o Elevate legs to the level of the heart and pump ankles as often as possible Medications-please add to medication list. Wound #8 Left,Anterior Lower Leg o Santyl Enzymatic Ointment Patient Medications Allergies: No Known Drug Allergies Notifications Medication Indication Start End Santyl 07/31/2016 DOSE topical 250 unit/gram ointment - ointment topical daily as directed Electronic Signature(s) Signed: 07/31/2016 4:00:30 PM By: Evlyn Kanner MD, FACS Entered By: Evlyn Kanner on 07/31/2016 16:00:28 [name] Mura ([id number]) -------------------------------------------------------------------------------- Problem List Details Patient Name: [name] Mura Date of Service: 07/31/2016 3:15 PM Medical Record Number: [id number] Patient Account Number: [id number] Date of Birth/Sex: 1956-06-06 (60 y.o. 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 07/31/2016  Yes exposed Inactive Problems Resolved Problems ICD-10 Code Description Active Date Resolved Date L97.211 Non-pressure chronic ulcer of right calf limited to 06/19/2016 06/19/2016 breakdown of skin L97.321 Non-pressure chronic ulcer of left ankle limited to 06/13/2016 06/13/2016 breakdown of skin Electronic Signature(s) Signed: 07/31/2016 3:56:32 PM By: Evlyn Kanner MD, FACS Previous Signature: 07/31/2016 3:35:04 PM Version By: Evlyn Kanner MD, FACS Entered By: Evlyn Kanner on 07/31/2016 15:56:32 Hokenson,  Taurean ([id number]) -------------------------------------------------------------------------------- Progress Note Details Patient Name: [name], [name] Date of Service: 07/31/2016 3:15 PM Medical Record Number: [id number] Patient Account Number: [id number] Date of Birth/Sex: June 22, 1956 (60 y.o. Male) Treating RN: Clover Mealy, RN, BSN, [facility] Primary Care Physician: FITZGERALD, [name] Other Clinician: Referring Physician: FITZGERALD, [name] Treating Physician/Extender: Rudene Re in Treatment: 6 Subjective Chief Complaint Information obtained from Patient Right second toe wound and left lateral malleolar wound. is doing fine and has no fresh issues. He does have his appointment with the vascular surgeons. However he does not have an appointment with the orthopedic surgeons yet. he did not come for 2 weeks because of the bad weather. He has no fresh issues. 04/10/16; the patient returns today with a wound over the last 2-3 weeks on the anterior part of his left ankle. 07/31/2016 -- the patient was recently discharged 3 weeks ago and comes back with a reopened wound the left anterior ankle has been there for about 2 weeks History of Present Illness (HPI) The following HPI elements were documented for the patient's wound: Location: left fourth toe Quality: Patient reports experiencing a dull pain to affected area(s). Severity: Patient states wound (s) are getting better. Duration: Patient states  that they are not certain how long the wound has been present. Timing: Pain in wound is constant (hurts all the time) Context: The wound appeared gradually over time Associated Signs and Symptoms: nil has been doing fine and has no fresh issues. He does say that he is trying to get all his appointments in order and at present he has a appointment with the vascular surgeons. While at home he removes his walking shoe and keeps the leg and foot open. There is no pain and minimal drainage. 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 He tells me that over the last 2-3 weeks he has noticed pain and a wound on the crease of his left dorsal ankle and he is here for our review events. Because of the deformity in his ankle he has a fracture boot that he uses the walker. This was made at [employer] in [facility]. [facility] he was in hospital in January and suffered a wide complex cardiac arrest at the time of the fistulagram. He has recovered from this. 04/17/16 a his left dorsal ankle appears to be improved. He has an appointment with [employer] to adjust his boot next week. He does not appear to have significant PAD but does have significant venous insufficiencywent 04/24/16; went to [facility]otech last week,did not adjust his boot due to edema. She has called his cardiologist and in the meantime they have apparently taken some extra fluid off him at dialysis. He dialyzes Monday Wednesday and Friday [name], [name] ([id number]) 05/01/16; the patient arrived with some scant surface eschar over his dorsal ankle wound. This was debridement. There is nothing but normal skin under this. His leg seems to have done well with a 2  layer compression we put on this last week accompanied by extra fluid removal at dialysis's resulted in less edema of his leg. He has chronic venous insufficiency with resultant edema been eating his chronic renal failure and deformity of his left ankle. 06/13/16; the  patient arrives today with a recurrent wound in the same spot as last time. He has chronic venous insufficiency also deformity of the foot and ankle likely secondary to Charcot deformity. The patient has type 2 diabetes with polyneuropathy. He states the stockings he was wearing were too tight and he feels that's why it rubbed his ankle. He also has a Systems developer, that was adjusted last time. 06/19/16; Arrives today with a new wound on the right anterior leg. The original wound on the crease of his dorsal left ankle in the setting of chronic venous insufficiency and a Charcot deformity in this area is roughly the same. 06/27/16; the area on the right anterior leg is just about closed over, only 2 small areas remaining. The original wound in the crease of the dorsal left ankle in the setting of chronic venous insufficiency and a Charcot deformity appears improved. The debridement of adherent surface slough and nonviable subcutaneous tissue post debridement everything looks quite healthy here. 07/03/16: right lower leg wound healed. left lower leg wound improved. no reports of systemic s/s of infection. 07/10/16; right lower leg wound is healed. The area on his left anterior ankle covered in and eschar. 07/31/2016 -- the patient was recently discharged in the middle of July returns in about 3 weeks with a fresh wound which has reopened. He is a diabetic with chronic venous insufficiency and also a Charcot foot. he says he started wearing compression stockings which have caused him an abrasion on the deformed ankle. Not find any evidence of venous insufficiency in the past. Review of his vascular studies done in June 2016 showed a right  ABI of 1.15 and a left ABI of 1.11 Objective Constitutional Pulse regular. Respirations normal and unlabored. Afebrile. Vitals Time Taken: 3:40 PM, Height: 72 in, Temperature: 98 F, Pulse: 71 bpm, Respiratory Rate: 18 breaths/min, Blood Pressure: 114/75 mmHg. Eyes Nonicteric. Reactive to light. Ears, Nose, Mouth, and Throat Lips, teeth, and gums WNL.Marland Kitchen Moist mucosa without lesions. Neck supple and nontender. No palpable supraclavicular or cervical adenopathy. Normal sized without goiter. [name] [name], [name] [name] ([id number]) Respiratory WNL. No retractions.. Breath sounds WNL, No rubs, rales, rhonchi, or wheeze.. Cardiovascular Heart rhythm and rate regular, no murmur or gallop.. Pedal Pulses WNL. No clubbing, cyanosis or edema. Chest Breasts symmetical and no nipple discharge.. Breast tissue WNL, no masses, lumps, or tenderness.. Gastrointestinal (GI) Abdomen without masses or tenderness.. No liver or spleen enlargement or tenderness.. Genitourinary (GU) No hydrocele, spermatocele, tenderness of the cord, or testicular mass.Marland Kitchen Penis without lesions.Renetta Chalk without lesions. No cystocele, or rectocele. Pelvic support intact, no discharge.Marland Kitchen Urethra without masses, tenderness or scarring.Marland Kitchen Lymphatic No adneopathy. No adenopathy. No adenopathy. Musculoskeletal Adexa without tenderness or enlargement.. Digits and nails w/o clubbing, cyanosis, infection, petechiae, ischemia, or inflammatory conditions.Marland Kitchen Psychiatric Judgement and insight Intact.. No evidence of depression, anxiety, or agitation.. General Notes: there is a reopened wound on the left anterior ankle in the skin crease and he has a significantly deformed foot. Debridement was not required but a moist saline gauze was able to remove a lot of debris which was superficial. Integumentary (Hair, Skin) No suspicious lesions. No crepitus or fluctuance. No peri-wound warmth or erythema. No masses.. Wound #8 status is Open. Original cause of  wound was Gradually Appeared. The wound is located on the Left,Anterior Lower Leg. The wound measures 1cm length x 3.4cm width x 0.2cm depth; 2.67cm^2 area and 0.534cm^3 volume. The wound is limited to skin breakdown. There is no tunneling or undermining noted.  There is a medium amount of serosanguineous drainage noted. The wound margin is distinct with the outline attached to the wound base. There is large (67-100%) pink, pale granulation within the wound bed. There is a small (1-33%) amount of necrotic tissue within the wound bed including Adherent Slough. The periwound skin appearance exhibited: Localized Edema, Moist. The periwound skin appearance did not exhibit: Callus, Crepitus, Excoriation, Fluctuance, Friable, Induration, Rash, Scarring, Dry/Scaly, Maceration, Atrophie Blanche, Cyanosis, Ecchymosis, Hemosiderin Staining, Mottled, Pallor, Rubor, Erythema. Periwound temperature was noted as No Abnormality. The periwound has tenderness on palpation. Assessment [name] [name], [name] [name] ([id number]) Active Problems ICD-10 E11.42 - Type 2 diabetes mellitus with diabetic polyneuropathy M14.672 - Charcot's joint, left ankle and foot L97.322 - Non-pressure chronic ulcer of left ankle with fat layer exposed This patient who is a diabetic, has chronic renal insufficiency and is on hemodialysis continues to have problems with fluid management and ends up with swelling of his feet. He has not been shown to have chronic venous insufficiency and I find no evidence of this in his records. Compression stockings have been detrimental to his care and I have asked him to discontinue using these. At the present time I recommend: 1. Santyl ointment locally to be applied daily and covered with a bordered foam. 2. If Santyl is not available he should use Mehdi honey. 3. elevation and exercise will be beneficial. 4. regular visits to the wound center. Plan Wound Cleansing: Wound #8 Left,Anterior Lower Leg: Clean  wound with Normal Saline. May Shower, gently pat wound dry prior to applying new dressing. Anesthetic: Wound #8 Left,Anterior Lower Leg: Topical Lidocaine 4% cream applied to wound bed prior to debridement Skin Barriers/Peri-Wound Care: Wound #8 Left,Anterior Lower Leg: Skin Prep Primary Wound Dressing: Wound #8 Left,Anterior Lower Leg: Santyl Ointment Secondary Dressing: Wound #8 Left,Anterior Lower Leg: Boardered Foam Dressing Dressing Change Frequency: Wound #8 Left,Anterior Lower Leg: Change dressing every day. Follow-up Appointments: Wound #8 Left,Anterior Lower Leg: Return Appointment in 1 week. [name], [name] ([id number]) Edema Control: Wound #8 Left,Anterior Lower Leg: Elevate legs to the level of the heart and pump ankles as often as possible Medications-please add to medication list.: Wound #8 Left,Anterior Lower Leg: Santyl Enzymatic Ointment The following medication(s) was prescribed: Santyl topical 250 unit/gram ointment ointment topical daily as directed starting 07/31/2016 This patient who is a diabetic, has chronic renal insufficiency and is on hemodialysis continues to have problems with fluid management and ends up with swelling of his feet. He has not been shown to have chronic venous insufficiency and I find no evidence of this in his records. Compression stockings have been detrimental to his care and I have asked him to discontinue using these. At the present time I recommend: 1. Santyl ointment locally to be applied daily and covered with a bordered foam. 2. If Santyl is not available he should use Mehdi honey. 3. elevation and exercise will be beneficial. 4. regular visits to the wound center. Electronic Signature(s) Signed: 07/31/2016 4:05:27 PM By: Evlyn Kanner MD, FACS Entered By: Evlyn Kanner on 07/31/2016 16:05:27 [name] Mura ([id number]) -------------------------------------------------------------------------------- SuperBill Details Patient Name:  [name] Mura Date of Service: 07/31/2016 Medical Record Number: [id number] Patient Account Number: [id number] Date of Birth/Sex: [birth year] (60 y.o. Male) Treating RN: Clover Mealy, RN, BSN, [facility] Primary Care Physician: FITZGERALD, [name] Other Clinician: Referring Physician: FITZGERALD, [name] Treating Physician/Extender: Rudene Re in Treatment: 6 Diagnosis Coding ICD-10 Codes Code Description E11.42 Type 2 diabetes mellitus with diabetic polyneuropathy M14.672 Charcot's joint, left ankle and foot  W11.914 Non-pressure chronic ulcer of left ankle with fat layer exposed Facility Procedures CPT4 Code: 78295621 Description: 99213 - WOUND CARE VISIT-LEV 3 EST PT Modifier: Quantity: 1 Physician Procedures CPT4 Code: 3086578 Description: 99213 - WC PHYS LEVEL 3 - EST PT ICD-10 Description Diagnosis 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

## 2016-07-31 NOTE — Progress Notes (Addendum)
[name] [name], [name] [name] ([id number]) Visit Report for 07/31/2016 Arrival Information Details Patient Name: [name] [name] [name] [name] Date of Service: 07/31/2016 3:15 PM Medical Record Number: [id number] Patient Account Number: [id number] Date of Birth/Sex: 03/15/1956 [age] y.o. Male) Treating RN: [name] MealyAfful, RN, [name] Primary Care Physician: [name] [name] Other Clinician: Referring Physician: FITZGERALD, [name] Treating Physician/Extender: [name] [name], [name] [name] in Treatment: 6 Visit Information History Since Last Visit All ordered tests and consults were completed: No Patient Arrived: Wheel Chair Added or deleted any medications: No Arrival Time: 15:36 Any new allergies or adverse reactions: No Accompanied By: self Had a fall or experienced change in No activities of daily living that may affect Transfer Assistance: None risk of falls: Patient Identification Verified: Yes Signs or symptoms of abuse/neglect since last No Secondary Verification Process Yes visito Completed: Hospitalized since last visit: No Patient Requires Transmission-Based No Has Dressing in Place as Prescribed: Yes Precautions: Pain Present Now: No Patient Has Alerts: No Electronic Signature(s) Signed: 07/31/2016 4:40:39 PM By: [name] [name], [name] BSN, RN Entered By: [name] [name], [name] on 07/31/2016 15:36:57 [name] [name] [name] [name] ([id number]) -------------------------------------------------------------------------------- Clinic Level of Care Assessment Details Patient Name: [name] [name] [name] [name] Date of Service: 07/31/2016 3:15 PM Medical Record Number: [id number] Patient Account Number: [id number] Date of Birth/Sex: 01/21/1956 [age] y.o. Male) Treating RN: [name] [name], [name] [name] Primary Care Physician: [name] [name] Other Clinician: Referring Physician: FITZGERALD, [name] Treating Physician/Extender: [name] [name], [name] [name] in Treatment: 6 Clinic Level of Care Assessment Items TOOL 4 Quantity Score []  - Use when only an EandM is performed on FOLLOW-UP visit 0 ASSESSMENTS -  Nursing Assessment / Reassessment X - Reassessment of Co-morbidities (includes updates in patient status) 1 10 X - Reassessment of Adherence to Treatment Plan 1 5 ASSESSMENTS - Wound and Skin Assessment / Reassessment X - Simple Wound Assessment / Reassessment - one wound 1 5 []  - Complex Wound Assessment / Reassessment - multiple wounds 0 []  - Dermatologic / Skin Assessment (not related to wound area) 0 ASSESSMENTS - Focused Assessment []  - Circumferential Edema Measurements - multi extremities 0 []  - Nutritional Assessment / Counseling / Intervention 0 X - Lower Extremity Assessment (monofilament, tuning fork, pulses) 1 5 []  - Peripheral Arterial Disease Assessment (using hand held doppler) 0 ASSESSMENTS - Ostomy and/or Continence Assessment and Care []  - Incontinence Assessment and Management 0 []  - Ostomy Care Assessment and Management (repouching, etc.) 0 PROCESS - Coordination of Care X - Simple Patient / Family Education for ongoing care 1 15 []  - Complex (extensive) Patient / Family Education for ongoing care 0 []  - Staff obtains ChiropractorConsents, Records, Test Results / Process Orders 0 []  - Staff telephones HHA, Nursing Homes / Clarify orders / etc 0 []  - Routine Transfer to another Facility (non-emergent condition) 0 [name] [name] ([id number]) []  - Routine Hospital Admission (non-emergent condition) 0 []  - New Admissions / Manufacturing engine[employer]surance Authorizations / Ordering NPWT, Apligraf, etc. 0 []  - Emergency Hospital Admission (emergent condition) 0 X - Simple Discharge Coordination 1 10 []  - Complex (extensive) Discharge Coordination 0 PROCESS - Special Needs []  - Pediatric / Minor Patient Management 0 []  - Isolation Patient Management 0 []  - Hearing / Language / Visual special needs 0 []  - Assessment of Community assistance (transportation, D/C planning, etc.) 0 []  - Additional assistance / Altered mentation 0 []  - Support Surface(s) Assessment (bed, cushion, seat, etc.) 0 INTERVENTIONS -  Wound Cleansing / Measurement X - Simple Wound Cleansing - one wound 1 5 []  - Complex Wound Cleansing - multiple wounds 0 X - Wound Imaging (photographs -  any number of wounds) 1 5  - Wound Tracing (instead of photographs) 0 X - Simple Wound Measurement - one wound 1 5  - Complex Wound Measurement - multiple wounds 0 INTERVENTIONS - Wound Dressings X - Small Wound Dressing one or multiple wounds 1 10  - Medium Wound Dressing one or multiple wounds 0  - Large Wound Dressing one or multiple wounds 0 X - Application of Medications - topical 1 5  - Application of Medications - injection 0 INTERVENTIONS - Miscellaneous  - External ear exam 0 [name] [name] ([id number])  - Specimen Collection (cultures, biopsies, blood, body fluids, etc.) 0  - Specimen(s) / Culture(s) sent or taken to Lab for analysis 0  - Patient Transfer (multiple staff / Michiel Sites Lift / Similar devices) 0  - Simple Staple / Suture removal (25 or less) 0  - Complex Staple / Suture removal (26 or more) 0  - Hypo / Hyperglycemic Management (close monitor of Blood Glucose) 0  - Ankle / Brachial Index (ABI) - do not check if billed separately 0 X - Vital Signs 1 5 Has the patient been seen at the hospital within the last three years: Yes Total Score: 85 Level Of Care: New/Established - Level 3 Electronic Signature(s) Signed: 07/31/2016 4:35:40 PM By: [name] Sites Entered By: [name] Sites on 07/31/2016 15:53:53 [name] [name] ([id number]) -------------------------------------------------------------------------------- Encounter Discharge Information Details Patient Name: [name] [name] Date of Service: 07/31/2016 3:15 PM Medical Record Number: [id number] Patient Account Number: [id number] Date of Birth/Sex: Sep 01, 1956 (60 y.o. Male) Treating RN: [name] Mealy, RN, [name], [name] Primary Care Physician: [name] [name] Other Clinician: Referring Physician: FITZGERALD, [name] Treating Physician/Extender: [name] Re in Treatment: 6 Encounter Discharge Information Items Discharge Pain Level: 0 Discharge Condition: Stable Ambulatory Status: Wheelchair Discharge Destination: Home Transportation: Private Auto Accompanied By: self Schedule Follow-up Appointment: No Medication Reconciliation completed Yes and provided to Patient/Care Niaya Hickok: Provided on Clinical Summary of Care: 07/31/2016 Form Type Recipient Paper Patient TA Electronic Signature(s) Signed: 07/31/2016 4:03:51 PM By: Gwenlyn Perking Entered By: Gwenlyn Perking on 07/31/2016 16:03:50 [name] [name] ([id number]) -------------------------------------------------------------------------------- Lower Extremity Assessment Details Patient Name: [name] [name] Date of Service: 07/31/2016 3:15 PM Medical Record Number: [id number] Patient Account Number: [id number] Date of Birth/Sex: [birth year] (60 y.o. Male) Treating RN: [name] Mealy, RN, [name], [name] Primary Care Physician: [name] [name] Other Clinician: Referring Physician: FITZGERALD, [name] Treating Physician/Extender: [name] Re in Treatment: 6 Edema Assessment Assessed: [Left: No] [Right: No] Edema: [Left: Ye] [Right: s] Calf Left: Right: Point of Measurement: 36 cm From Medial Instep 40.3 cm cm Ankle Left: Right: Point of Measurement: 14 cm From Medial Instep 32 cm cm Vascular Assessment Pulses: Posterior Tibial Dorsalis Pedis Doppler: [Left:Monophasic] Extremity colors, hair growth, and conditions: Extremity Color: [Left:Dusky] Hair Growth on Extremity: [Left:No] Temperature of Extremity: [Left:Warm] Capillary Refill: [Left:< 3 seconds] Toe Nail Assessment Left: Right: Thick: Yes Discolored: Yes Deformed: Yes Improper Length and Hygiene: Yes Electronic Signature(s) Signed: 07/31/2016 4:40:39 PM By: [name] Eric BSN, RN Entered By: [name] Eric on 07/31/2016 15:40:26 [name] [name]  ([id number]) -------------------------------------------------------------------------------- Multi Wound Chart Details Patient Name: [name] [name] Date of Service: 07/31/2016 3:15 PM Medical Record Number: [id number] Patient Account Number: [id number] Date of Birth/Sex: 05-Jun-1956 (60 y.o. 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] Comorbid History: [8:Cataracts, Anemia, Arrhythmia, Congestive Heart Failure, Coronary Artery Disease, Hypertension, Myocardial Infarction, Type II Diabetes, End Stage Renal Disease, Osteoarthritis, Neuropathy] [N/A:N/A] Date Acquired: [8:07/24/2016] [N/A:N/A] [name] of Treatment: [8:0] [N/A:N/A] Wound Status: [8:Open] [N/A:N/A] Measurements L x W x D 1x3.4x0.2 [N/A:N/A] (cm) Area (cm) : [8:2.67] [N/A:N/A] Volume (cm) : [8:0.534] [N/A:N/A] Classification: [8:Grade 1] [N/A:N/A] Exudate Amount: [8:Medium] [N/A:N/A] Exudate Type: [8:Serosanguineous] [N/A:N/A] Exudate Color: [8:red, brown] [N/A:N/A] Wound Margin: [8:Distinct, outline attached N/A] Granulation Amount: [8:Large (67-100%)] [N/A:N/A] Granulation Quality: [8:Pink, Pale] [N/A:N/A] Necrotic Amount: [8:Small (1-33%)] [N/A:N/A] Exposed Structures: [N/A:N/A] Fascia: No Fat: No Tendon: No Muscle: No Joint: No Bone: No Limited to Skin Breakdown Epithelialization: None N/A N/A Periwound Skin Texture: Edema: Yes N/A N/A Excoriation: No Induration: No Callus: No Crepitus:  No Fluctuance: No Friable: No Rash: No Scarring: No Periwound Skin Moist: Yes N/A N/A Moisture: Maceration: No Dry/Scaly: No Periwound Skin Color: Atrophie Blanche: No N/A N/A Cyanosis: No Ecchymosis: No Erythema: No Hemosiderin Staining: No Mottled: No Pallor: No Rubor: No Temperature: No Abnormality N/A N/A Tenderness on Yes N/A N/A Palpation: Wound Preparation: Ulcer Cleansing: N/A N/A Rinsed/Irrigated with Saline Topical Anesthetic Applied: Other: Lidocaine 4% Treatment Notes Electronic Signature(s) Signed: 07/31/2016 4:35:40 PM By: [name] Sites Entered By: [name] Sites on 07/31/2016 15:48:37 [name] [name] ([id number]) -------------------------------------------------------------------------------- Multi-Disciplinary Care Plan Details Patient Name: [name] [name] Date of Service: 07/31/2016 3:15 PM Medical Record Number: [id number] Patient Account Number: [id number] Date of Birth/Sex: October 24, 1956 (60 y.o. Male) Treating RN: [name] Sites Primary Care Physician: [name] [name] Other Clinician: Referring Physician: FITZGERALD, [name] Treating Physician/Extender: [name] Re in Treatment: 6 Active Inactive Electronic Signature(s) Signed: 07/31/2016 4:35:40 PM By: [name] Sites Entered By: [name] Sites on 07/31/2016 15:48:13 [name] [name] ([id number]) -------------------------------------------------------------------------------- Pain Assessment Details Patient Name: [name] [name] Date of Service: 07/31/2016 3:15 PM Medical Record Number: [id number] Patient Account Number: [id number] Date of Birth/Sex: 06-04-1956 (60 y.o. Male) Treating RN: [name] Mealy, RN, [name], [name] Primary Care Physician: [name] [name] Other Clinician: Referring Physician: FITZGERALD, [name] Treating Physician/Extender: [name] Re in Treatment: 6 Active Problems Location of Pain Severity and Description of Pain Patient Has Paino No Site Locations With Dressing Change: No Pain  Management and Medication Current Pain Management: Electronic Signature(s) Signed: 07/31/2016 4:40:39 PM By: [name] Eric BSN, RN Entered By: [name] Eric on 07/31/2016 15:37:08 [name] [name] ([id number]) -------------------------------------------------------------------------------- Patient/Caregiver Education Details Patient Name: [name] [name] Date of Service: 07/31/2016 3:15 PM Medical Record Number: [id number] Patient Account Number: [id number] Date of Birth/Gender: 03/31/1956 (60 y.o. Male) Treating RN: [name] Mealy, RN, [name], [name] Primary Care Physician: [name] [name] Other Clinician: Referring Physician: FITZGERALD, [name] Treating Physician/Extender: [name] Re in Treatment: 6 Education Assessment Education Provided To: Patient Education Topics Provided Wound/Skin Impairment: Methods: Explain/Verbal Responses: State content correctly Electronic Signature(s) Signed: 07/31/2016 4:40:39 PM By: [name] Eric BSN, RN Entered By: [name] Eric on 07/31/2016 15:57:45 [name] [name] ([id number]) -------------------------------------------------------------------------------- Wound Assessment Details Patient Name: [name] [name] Date of Service: 07/31/2016 3:15 PM Medical Record Number: [id number] Patient Account Number: [id number] Date of Birth/Sex: 23-Feb-1956 (60 y.o. Male) Treating RN: [name] Mealy, RN, [name], [name] Primary Care Physician: [name] [name] Other Clinician: Referring Physician: FITZGERALD, [name] Treating Physician/Extender: [name] Re in Treatment: 6 Wound Status Wound Number: 8 Primary Diabetic Wound/Ulcer of the Lower Etiology: Extremity Wound Location: Left Lower Leg - Anterior Wound Open Wounding Event: Gradually Appeared Status: Date Acquired: 07/24/2016 Comorbid Cataracts, Anemia, Arrhythmia, [name]  Of Treatment: 0 History: Congestive Heart Failure, Coronary Clustered Wound: No Artery Disease, Hypertension, Myocardial Infarction, Type II Diabetes, End Stage  Renal Disease, Osteoarthritis, Neuropathy Photos Photo Uploaded By: [name] Eric on 07/31/2016 16:33:45 Wound Measurements Length: (cm) 1 Width: (cm) 3.4 Depth: (cm) 0.2 Area: (cm) 2.67 Volume: (cm) 0.534 % Reduction in Area: % Reduction in Volume: Epithelialization: None Tunneling: No Undermining: No Wound Description Classification: Grade 1 Foul Odor Aft Wound Margin: Distinct, outline attached Exudate Amount: Medium Exudate Type: Serosanguineous Exudate Color: red, brown er Cleansing: No Wound Bed Granulation Amount: Large (67-100%) Exposed Structure Hunn, Teofilo ([id number]) Granulation Quality: Pink, Pale Fascia Exposed: No Necrotic Amount: Small (1-33%) Fat Layer Exposed: No Necrotic Quality: Adherent Slough Tendon Exposed: No Muscle Exposed: No Joint Exposed: No Bone Exposed: No Limited to Skin Breakdown Periwound Skin Texture Texture Color No Abnormalities Noted: No No Abnormalities Noted: No Callus: No Atrophie Blanche: No Crepitus: No Cyanosis: No Excoriation: No Ecchymosis: No Fluctuance: No Erythema: No Friable: No Hemosiderin Staining: No Induration: No Mottled: No Localized Edema: Yes Pallor: No Rash: No Rubor: No Scarring: No Temperature / Pain Moisture Temperature: No Abnormality No Abnormalities Noted: No Tenderness on Palpation: Yes Dry / Scaly: No Maceration: No Moist: Yes Wound Preparation Ulcer Cleansing: Rinsed/Irrigated with Saline Topical Anesthetic Applied: Other: Lidocaine 4%, Treatment Notes Wound #8 (Left, Anterior Lower Leg) 1. Cleansed with: Clean wound with Normal Saline 4. Dressing Applied: Santyl Ointment 5. Secondary Dressing Applied Bordered Foam Dressing Dry Gauze Electronic Signature(s) Signed: 07/31/2016 4:40:39 PM By: [name] Eric BSN, RN Entered By: [name] Eric on 07/31/2016 15:42:40 [name] [name] ([id number]) -------------------------------------------------------------------------------- Vitals  Details Patient Name: [name] [name] Date of Service: 07/31/2016 3:15 PM Medical Record Number: [id number] Patient Account Number: [id number] Date of Birth/Sex: [birth year] (60 y.o. Male) Treating RN: [name] Mealy, RN, [name], [name] Primary Care Physician: [name] [name] Other Clinician: Referring Physician: FITZGERALD, [name] Treating Physician/Extender: [name] Re in Treatment: 6 Vital Signs Time Taken: 15:40 Temperature (F): 98 Height (in): 72 Pulse (bpm): 71 Respiratory Rate (breaths/min): 18 Blood Pressure (mmHg): 114/75 Reference Range: 80 - 120 mg / dl Electronic Signature(s) Signed: 07/31/2016 4:40:39 PM By: [name] Eric BSN, RN Entered By: [name] Eric on 07/31/2016 15:39:24

## 2016-08-01 ENCOUNTER — Ambulatory Visit: Payer: Medicare Other | Admitting: Surgery

## 2016-08-09 ENCOUNTER — Encounter (HOSPITAL_BASED_OUTPATIENT_CLINIC_OR_DEPARTMENT_OTHER): Payer: Medicare Other | Admitting: General Surgery

## 2016-08-09 DIAGNOSIS — L97322 Non-pressure chronic ulcer of left ankle with fat layer exposed: Secondary | ICD-10-CM | POA: Diagnosis not present

## 2016-08-09 DIAGNOSIS — E1142 Type 2 diabetes mellitus with diabetic polyneuropathy: Secondary | ICD-10-CM | POA: Diagnosis not present

## 2016-08-09 NOTE — Progress Notes (Signed)
See I heal note 

## 2016-08-10 NOTE — Progress Notes (Signed)
[name] [name], Elias ([id number]) Visit Report for 08/09/2016 Arrival Information Details Patient Name: [name] [name], [name] [name] Date of Service: 08/09/2016 8:45 AM Medical Record Number: [id number] Patient Account Number: [id number] Date of Birth/Sex: 12/12/1956 [age] y.o. Male) Treating RN: Phillis HaggisPinkerton, Debi Primary Care Physician: FITZGERALD, [name] Other Clinician: Referring Physician: FITZGERALD, [name] Treating Physician/Extender: Elayne SnarePARKER, PETER Weeks in Treatment: 8 Visit Information History Since Last Visit All ordered tests and consults were completed: No Patient Arrived: Wheel Chair Added or deleted any medications: No Arrival Time: 08:42 Any new allergies or adverse reactions: No Accompanied By: self Had a fall or experienced change in No Transfer Assistance: [employer] activities of daily living that may affect Patient Lift risk of falls: Patient Identification Verified: Yes Signs or symptoms of abuse/neglect since last No Secondary Verification Process Yes visito Completed: Hospitalized since last visit: No Patient Requires Transmission- No Pain Present Now: Yes Based Precautions: Patient Has Alerts: No Electronic Signature(s) Signed: 08/09/2016 9:30:10 AM By: Elpidio EricAfful, Rita BSN, RN Entered By: Elpidio EricAfful, Rita on 08/09/2016 09:30:10 [name] [name], [name] [name] ([id number]) -------------------------------------------------------------------------------- Encounter Discharge Information Details Patient Name: [name] [name], [name] [name] Date of Service: 08/09/2016 8:45 AM Medical Record Number: [id number] Patient Account Number: [id number] Date of Birth/Sex: 01/18/1956 [age] y.o. Male) Treating RN: Phillis HaggisPinkerton, Debi Primary Care Physician: FITZGERALD, [name] Other Clinician: Referring Physician: FITZGERALD, [name] Treating Physician/Extender: Rudene ReBritto, Errol Weeks in Treatment: 8 Encounter Discharge Information Items Discharge Pain Level: 0 Discharge Condition: Stable Ambulatory Status: Wheelchair Discharge Destination:  Home Transportation: Other Accompanied By: self Schedule Follow-up Appointment: Yes Medication Reconciliation completed and provided to Patient/Care Yes Mychelle Kendra: Provided on Clinical Summary of Care: 08/09/2016 Form Type Recipient Paper Patient TA Electronic Signature(s) Signed: 08/09/2016 9:23:11 AM By: Ardath SaxParker, Peter MD Previous Signature: 08/09/2016 9:21:03 AM Version By: Gwenlyn PerkingMoore, Shelia Entered By: Ardath SaxParker, Peter on 08/09/2016 09:23:10 [name] [name], [name] [name] ([id number]) -------------------------------------------------------------------------------- Lower Extremity Assessment Details Patient Name: [name] [name], [name] [name] Date of Service: 08/09/2016 8:45 AM Medical Record Number: [id number] Patient Account Number: [id number] Date of Birth/Sex: 11/13/1956 [age] y.o. Male) Treating RN: Phillis HaggisPinkerton, Debi Primary Care Physician: FITZGERALD, [name] Other Clinician: Referring Physician: FITZGERALD, [name] Treating Physician/Extender: Ardath SaxPARKER, PETER Weeks in Treatment: 8 Edema Assessment Assessed: [Left: No] [Right: No] E[Left: dema] [Right: :] Calf Left: Right: Point of Measurement: 36 cm From Medial Instep 40.4 cm cm Ankle Left: Right: Point of Measurement: 14 cm From Medial Instep 32.2 cm cm Vascular Assessment Pulses: Posterior Tibial Dorsalis Pedis Palpable: [Left:No] Extremity colors, hair growth, and conditions: Extremity Color: [Left:Dusky] Temperature of Extremity: [Left:Warm] Capillary Refill: [Left:< 3 seconds] Toe Nail Assessment Left: Right: Thick: Yes Discolored: Yes Deformed: Yes Improper Length and Hygiene: Yes Electronic Signature(s) Signed: 08/09/2016 9:30:30 AM By: Elpidio EricAfful, Rita BSN, RN Signed: 08/09/2016 4:58:27 PM By: Alejandro MullingPinkerton, Debra Entered By: Elpidio EricAfful, Rita on 08/09/2016 09:30:30 [name] [name], [name] [name] ([id number]) -------------------------------------------------------------------------------- Multi Wound Chart Details Patient Name: [name] [name], [name] [name] Date of Service: 08/09/2016 8:45 AM Medical  Record Number: [id number] Patient Account Number: [id number] Date of Birth/Sex: 01/26/1956 [age] y.o. 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] Comorbid History: [8:Cataracts, Anemia, Arrhythmia, Congestive Heart Failure, Coronary Artery Disease, Hypertension, Myocardial Infarction, Type II Diabetes, End Stage Renal Disease, Osteoarthritis, Neuropathy] [N/A:N/A] Date Acquired: [8:07/24/2016] [N/A:N/A] Weeks of  Treatment: [8:1] [N/A:N/A] Wound Status: [8:Open] [N/A:N/A] Measurements L x W x D 0.8x2.3x0.2 [N/A:N/A] (cm) Area (cm) : [8:1.445] [N/A:N/A] Volume (cm) : [8:0.289] [N/A:N/A] % Reduction in Area: [8:45.90%] [N/A:N/A] % Reduction in Volume: 45.90% [N/A:N/A] Classification: [8:Grade 1] [N/A:N/A] Exudate Amount: [8:Large] [N/A:N/A] Exudate Type: [8:Serosanguineous] [N/A:N/A] Exudate Color: [8:red, brown] [N/A:N/A] Wound Margin: [8:Distinct, outline attached N/A] Granulation Amount: [8:Large (67-100%)] [N/A:N/A] Granulation Quality: [8:Pink, Pale] [N/A:N/A] Necrotic Amount: Small (1-33%) N/A N/A Exposed Structures: Fascia: No N/A N/A Fat: No Tendon: No Muscle: No Joint: No Bone: No Limited to Skin Breakdown Epithelialization: None N/A N/A Debridement: Debridement (16109- N/A N/A 11047) Time-Out Taken: Yes N/A N/A Pain Control: Lidocaine 4% Topical N/A N/A Solution Tissue Debrided: Fibrin/Slough, N/A N/A Subcutaneous Level: Skin/Subcutaneous N/A  N/A Tissue Debridement Area (sq 1.84 N/A N/A cm): Instrument: Curette N/A N/A Bleeding: Minimum N/A N/A Hemostasis Achieved: Pressure N/A N/A Procedural Pain: 0 N/A N/A Post Procedural Pain: 0 N/A N/A Debridement Treatment Procedure was tolerated N/A N/A Response: well Post Debridement 0.8x2.3x0.3 N/A N/A Measurements L x W x D (cm) Post Debridement 0.434 N/A N/A Volume: (cm) Periwound Skin Texture: Edema: Yes N/A N/A Excoriation: No Induration: No Callus: No Crepitus: No Fluctuance: No Friable: No Rash: No Scarring: No Periwound Skin Moist: Yes N/A N/A Moisture: Maceration: No Dry/Scaly: No Periwound Skin Color: Atrophie Blanche: No N/A N/A Cyanosis: No Ecchymosis: No Erythema: No Hemosiderin Staining: No Monier, Taggart ([id number]) Mottled: No Pallor: No Rubor: No Temperature: No Abnormality N/A N/A Tenderness on Yes N/A N/A Palpation: Wound Preparation: Ulcer Cleansing: N/A N/A Rinsed/Irrigated with Saline Topical Anesthetic Applied: Other: Lidocaine 4% Procedures Performed: Debridement N/A N/A Treatment Notes Wound #8 (Left, Anterior Lower Leg) 1. Cleansed with: Clean wound with Normal Saline 2. Anesthetic Topical Lidocaine 4% cream to wound bed prior to debridement 4. Dressing Applied: Santyl Ointment 5. Secondary Dressing Applied ABD Pad Dry Gauze Kerlix/Conform 7. Secured with Tape Notes netting Electronic Signature(s) Signed: 08/09/2016 9:31:21 AM By: Elpidio Eric BSN, RN Entered By: Elpidio Eric on 08/09/2016 09:31:21 [name] Mura ([id number]) -------------------------------------------------------------------------------- Multi-Disciplinary Care Plan Details Patient Name: [name] Mura Date of Service: 08/09/2016 8:45 AM Medical Record Number: [id number] Patient Account Number: [id number] Date of Birth/Sex: Dec 16, 1956 (60 y.o. Male) Treating RN: Phillis Haggis Primary Care Physician: FITZGERALD, [name] Other Clinician: Referring  Physician: FITZGERALD, [name] Treating Physician/Extender: Ardath Sax Weeks in Treatment: 8 Active Inactive Electronic Signature(s) Signed: 08/09/2016 9:30:49 AM By: Elpidio Eric BSN, RN Signed: 08/09/2016 4:58:27 PM By: Alejandro Mulling Entered By: Elpidio Eric on 08/09/2016 09:30:49 [name] Mura ([id number]) -------------------------------------------------------------------------------- Pain Assessment Details Patient Name: [name] Mura Date of Service: 08/09/2016 8:45 AM Medical Record Number: [id number] Patient Account Number: [id number] Date of Birth/Sex: [birth year] (60 y.o. Male) Treating RN: Phillis Haggis Primary Care Physician: FITZGERALD, [name] Other Clinician: Referring Physician: FITZGERALD, [name] Treating Physician/Extender: Ardath Sax Weeks in Treatment: 8 Active Problems Location of Pain Severity and Description of Pain Patient Has Paino Yes Site Locations Pain Location: Pain in Ulcers With Dressing Change: Yes Duration of the Pain. Constant / Intermittento Constant Rate the pain. Current Pain Level: 4 Worst Pain Level: 8 Least Pain Level: 2 Character of Pain Describe the Pain: Aching, Tender, Throbbing Pain Management and Medication Current Pain Management: Electronic Signature(s) Signed: 08/09/2016 9:30:17 AM By: Elpidio Eric BSN, RN Signed: 08/09/2016 4:58:27 PM By: Alejandro Mulling Entered By: Elpidio Eric on 08/09/2016 09:30:17 [name] Mura ([id number]) -------------------------------------------------------------------------------- Patient/Caregiver Education Details Patient Name: [name] Mura Date of Service: 08/09/2016 8:45 AM Medical Record Number: [id number] Patient Account Number:  [id number] Date of Birth/Gender: 09/17/1956 [age] y.o. Male) Treating RN: Phillis HaggisPinkerton, Debi Primary Care Physician: FITZGERALD, [name] Other Clinician: Referring Physician: FITZGERALD, [name] Treating Physician/Extender: Rudene ReBritto, Errol Weeks in Treatment: 8 Education  Assessment Education Provided To: Patient Education Topics Provided Wound/Skin Impairment: Handouts: Other: change dressing as ordered Methods: Demonstration, Explain/Verbal Responses: State content correctly Electronic Signature(s) Signed: 08/10/2016 8:09:12 AM By: Ardath SaxParker, Peter MD Entered By: Ardath SaxParker, Peter on 08/09/2016 09:23:19 [name] [name], [name] [name] ([id number]) -------------------------------------------------------------------------------- Wound Assessment Details Patient Name: [name] [name], [name] [name] Date of Service: 08/09/2016 8:45 AM Medical Record Number: [id number] Patient Account Number: [id number] Date of Birth/Sex: 01/13/1956 [age] y.o. Male) Treating RN: Phillis HaggisPinkerton, Debi Primary Care Physician: FITZGERALD, [name] Other Clinician: Referring Physician: FITZGERALD, [name] Treating Physician/Extender: Rudene ReBritto, Errol Weeks in Treatment: 8 Wound Status Wound Number: 8 Primary Diabetic Wound/Ulcer of the Lower Etiology: Extremity Wound Location: Left Lower Leg - Anterior Wound Open Wounding Event: Gradually Appeared Status: Date Acquired: 07/24/2016 Comorbid Cataracts, Anemia, Arrhythmia, Weeks Of Treatment: 1 History: Congestive Heart Failure, Coronary Clustered Wound: No Artery Disease, Hypertension, Myocardial Infarction, Type II Diabetes, End Stage Renal Disease, Osteoarthritis, Neuropathy Photos Photo Uploaded By: Alejandro MullingPinkerton, Debra on 08/09/2016 09:52:23 Wound Measurements Length: (cm) 0.8 Width: (cm) 2.3 Depth: (cm) 0.2 Area: (cm) 1.445 Volume: (cm) 0.289 % Reduction in Area: 45.9% % Reduction in Volume: 45.9% Epithelialization: None Tunneling: No Undermining: No Wound Description Classification: Grade 1 Foul Odor Afte Wound Margin: Distinct, outline attached Exudate Amount: Large Exudate Type: Serosanguineous Exudate Color: red, brown r Cleansing: No Wound Bed Granulation Amount: Large (67-100%) Exposed Structure Jurczyk, Kastin ([id number]) Granulation Quality: Pink,  Pale Fascia Exposed: No Necrotic Amount: Small (1-33%) Fat Layer Exposed: No Necrotic Quality: Adherent Slough Tendon Exposed: No Muscle Exposed: No Joint Exposed: No Bone Exposed: No Limited to Skin Breakdown Periwound Skin Texture Texture Color No Abnormalities Noted: No No Abnormalities Noted: No Callus: No Atrophie Blanche: No Crepitus: No Cyanosis: No Excoriation: No Ecchymosis: No Fluctuance: No Erythema: No Friable: No Hemosiderin Staining: No Induration: No Mottled: No Localized Edema: Yes Pallor: No Rash: No Rubor: No Scarring: No Temperature / Pain Moisture Temperature: No Abnormality No Abnormalities Noted: No Tenderness on Palpation: Yes Dry / Scaly: No Maceration: No Moist: Yes Wound Preparation Ulcer Cleansing: Rinsed/Irrigated with Saline Topical Anesthetic Applied: Other: Lidocaine 4%, Treatment Notes Wound #8 (Left, Anterior Lower Leg) 1. Cleansed with: Clean wound with Normal Saline 2. Anesthetic Topical Lidocaine 4% cream to wound bed prior to debridement 4. Dressing Applied: Santyl Ointment 5. Secondary Dressing Applied ABD Pad Dry Gauze Kerlix/Conform 7. Secured with Tape Notes netting Getchell, Cinque ([id number]) Electronic Signature(s) Signed: 08/09/2016 4:58:27 PM By: Alejandro MullingPinkerton, Debra Entered By: Alejandro MullingPinkerton, Debra on 08/09/2016 08:54:33 [name] [name], Khanh ([id number]) -------------------------------------------------------------------------------- Vitals Details Patient Name: [name] [name], Clemence Date of Service: 08/09/2016 8:45 AM Medical Record Number: [id number] Patient Account Number: [id number] Date of Birth/Sex: 06/17/1956 [age] y.o. Male) Treating RN: Phillis HaggisPinkerton, Debi Primary Care Physician: FITZGERALD, [name] Other Clinician: Referring Physician: FITZGERALD, [name] Treating Physician/Extender: Ardath SaxPARKER, PETER Weeks in Treatment: 8 Vital Signs Time Taken: 08:47 Temperature (F): 97.9 Height (in): 72 Pulse (bpm): 86 Respiratory Rate  (breaths/min): 18 Blood Pressure (mmHg): 101/66 Reference Range: 80 - 120 mg / dl Electronic Signature(s) Signed: 08/09/2016 9:30:23 AM By: Elpidio EricAfful, Rita BSN, RN Entered By: Elpidio EricAfful, Rita on 08/09/2016 09:30:23

## 2016-08-13 NOTE — Progress Notes (Signed)
DELMAR, ARRIAGA ([id number]) Visit Report for 08/09/2016 Chief Complaint Document Details Patient Name: [name] [name], [name] [name] Date of Service: 08/09/2016 8:45 AM Medical Record Number: [id number] Patient Account Number: [id number] Date of Birth/Sex: Dec 13, 1956 (60 y.o. Male) Treating RN: Phillis Haggis Primary Care Physician: FITZGERALD, [name] Other Clinician: Referring Physician: FITZGERALD, [name] Treating Physician/Extender: Elayne Snare in Treatment: 8 Information Obtained from: Patient Chief Complaint Right second toe wound and left lateral malleolar wound. is doing fine and has no fresh issues. He does have his appointment with the vascular surgeons. However he does not have an appointment with the orthopedic surgeons yet. he did not come for 2 weeks because of the bad weather. He has no fresh issues. 04/10/16; the patient returns today with a wound over the last 2-3 weeks on the anterior part of his left ankle. 07/31/2016 -- the patient was recently discharged 3 weeks ago and comes back with a reopened wound the left anterior ankle has been there for about 2 weeks Electronic Signature(s) Signed: 08/09/2016 9:19:24 AM By: Ardath Sax MD Entered By: Ardath Sax on 08/09/2016 09:19:23 [name] [name] ([id number]) -------------------------------------------------------------------------------- Debridement Details Patient Name: [name] [name] Date of Service: 08/09/2016 8:45 AM Medical Record Number: [id number] Patient Account Number: [id number] Date of Birth/Sex: [birth year] (60 y.o. Male) Treating RN: Clover Mealy, RN, BSN, Rita Primary Care Physician: FITZGERALD, [name] Other Clinician: Referring Physician: FITZGERALD, [name] Treating Physician/Extender: Rudene Re in Treatment: 8 Debridement Performed for Wound #8 Left,Anterior Lower Leg Assessment: Performed By: Physician Ardath Sax, MD Debridement: Debridement Pre-procedure Yes Verification/Time Out Taken: Start Time:  09:00 Pain Control: Lidocaine 4% Topical Solution Level: Skin/Subcutaneous Tissue Total Area Debrided (L x 0.8 (cm) x 2.3 (cm) = 1.84 (cm) W): Tissue and other Non-Viable, Fibrin/Slough, Subcutaneous material debrided: Instrument: Curette Bleeding: Minimum Hemostasis Achieved: Pressure End Time: 09:04 Procedural Pain: 0 Post Procedural Pain: 0 Response to Treatment: Procedure was tolerated well Post Debridement Measurements of Total Wound Length: (cm) 0.8 Width: (cm) 2.3 Depth: (cm) 0.3 Volume: (cm) 0.434 Post Procedure Diagnosis Same as Pre-procedure Electronic Signature(s) Signed: 08/09/2016 12:00:13 PM By: Elpidio Eric BSN, RN Signed: 08/13/2016 4:08:58 PM By: Evlyn Kanner MD, FACS Entered By: Elpidio Eric on 08/09/2016 09:04:57 [name] [name] ([id number]) -------------------------------------------------------------------------------- HPI Details Patient Name: [name] [name] Date of Service: 08/09/2016 8:45 AM Medical Record Number: [id number] Patient Account Number: [id number] Date of Birth/Sex: 03/01/1956 (60 y.o. Male) Treating RN: Phillis Haggis Primary Care Physician: FITZGERALD, [name] Other Clinician: Referring Physician: FITZGERALD, [name] Treating Physician/Extender: Ardath Sax Weeks in Treatment: 8 History of Present Illness Location: left fourth toe Quality: Patient reports experiencing a dull pain to affected area(s). Severity: Patient states wound (s) are getting better. Duration: Patient states that they are not certain how long the wound has been present. Timing: Pain in wound is constant (hurts all the time) Context: The wound appeared gradually over time Associated Signs and Symptoms: nil HPI Description: has been doing fine and has no fresh issues. He does say that he is trying to get all his appointments in order and at present he has a appointment with the vascular surgeons. While at home he removes his walking shoe and keeps the leg and foot  open. There is no pain and minimal drainage. 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 He tells me that over the last 2-3 weeks he has noticed pain and a wound on the crease of his left dorsal ankle and he is here for our review events. Because of the deformity in his ankle he has a fracture boot that he uses the walker. [facility] he was in hospital in January and suffered a wide complex cardiac arrest at the time of the fistulagram. He has recovered from this. 04/17/16 a his left dorsal ankle appears to be improved. He has an appointment with [employer] to adjust his boot next week. He does not appear to have significant PAD but does have significant venous insufficiencywent 04/24/16; went to [employer] last week,did not adjust his boot due to edema. She has called his cardiologist and in the meantime they have apparently taken some extra fluid off him at dialysis. He dialyzes Monday Wednesday and Friday 05/01/16; the patient arrived with some scant surface eschar over his dorsal ankle wound. This was debridement. There is nothing but normal skin under this. His leg seems to have done well with a 2 layer compression we put on this last week accompanied by extra fluid removal at dialysis's resulted in less edema of his leg. He has chronic venous insufficiency with resultant edema been eating his chronic renal failure and deformity of his left ankle. 06/13/16; the patient arrives today with a recurrent wound in the same spot as last time. He has chronic venous insufficiency also deformity of the foot and ankle likely secondary to Charcot deformity. The patient has type 2  diabetes with polyneuropathy. He states the stockings he was wearing were too tight and he feels that's why it rubbed his ankle. He also has a Systems developerCharcot boot, that was adjusted last time. 06/19/16; Arrives today with a new wound on the right anterior leg. The original wound on the crease of his dorsal left ankle in the setting of chronic venous insufficiency and a Charcot deformity in this area is roughly Guess, Loys ([id number]) the same. 06/27/16; the area on the right anterior leg is just about closed over, only 2 small areas remaining. The original wound in the crease of the dorsal left ankle in the setting of chronic venous insufficiency and a Charcot deformity appears improved. The debridement of adherent surface slough and nonviable subcutaneous tissue post debridement everything looks quite healthy here. 07/03/16: right lower leg wound healed. left lower leg wound improved. no reports of systemic s/s of infection. 07/10/16; right lower leg wound is healed. The area on his left anterior ankle covered in and eschar. 07/31/2016 -- the patient was recently discharged in the middle of July returns in about 3 weeks with a fresh wound which has reopened. He is a diabetic with chronic venous insufficiency and also a Charcot foot. he says he started wearing compression stockings which have caused him an abrasion on the deformed ankle. Not find any evidence of venous insufficiency in the past. Review of his vascular studies done in June 2016 showed a right ABI of 1.15 and a left ABI of 1.11 Electronic Signature(s) Signed: 08/09/2016 9:19:35 AM By: Ardath SaxParker, Donise Woodle MD Entered By: Ardath SaxParker, Olga Seyler on 08/09/2016 09:19:35 [name] [name], [name] [name] ([id number]) -------------------------------------------------------------------------------- Physical Exam Details Patient Name: [name] [name], [name] [name] Date of Service: 08/09/2016 8:45 AM Medical Record Number: [id number] Patient Account Number: [id number] Date of Birth/Sex: 04/19/1956 [age]  y.o. Male) Treating RN: Phillis HaggisPinkerton, Debi Primary Care Physician: FITZGERALD,  [name] Other Clinician: Referring Physician: FITZGERALD, [name] Treating Physician/Extender: Elayne Snare in Treatment: 8 Electronic Signature(s) Signed: 08/09/2016 9:19:43 AM By: Ardath Sax MD Entered By: Ardath Sax on 08/09/2016 09:19:42 [name] [name] ([id number]) -------------------------------------------------------------------------------- Physician Orders Details Patient Name: [name] [name] Date of Service: 08/09/2016 8:45 AM Medical Record Number: [id number] Patient Account Number: [id number] Date of Birth/Sex: 1956-10-23 (60 y.o. Male) Treating RN: Clover Mealy, RN, BSN, [facility] Primary Care Physician: FITZGERALD, [name] Other Clinician: Referring Physician: FITZGERALD, [name] Treating Physician/Extender: Elayne Snare in Treatment: 8 Verbal / Phone Orders: Yes Clinician: Afful, RN, BSN, Rita Read Back and Verified: Yes Diagnosis Coding Wound Cleansing Wound #8 Left,Anterior Lower Leg o Clean wound with Normal Saline. o May Shower, gently pat wound dry prior to applying new dressing. Anesthetic Wound #8 Left,Anterior Lower Leg o Topical Lidocaine 4% cream applied to wound bed prior to debridement Skin Barriers/Peri-Wound Care Wound #8 Left,Anterior Lower Leg o Skin Prep Primary Wound Dressing Wound #8 Left,Anterior Lower Leg o Santyl Ointment - in the clinic o Aquacel Ag - when next dressing change is due Secondary Dressing Wound #8 Left,Anterior Lower Leg o Boardered Foam Dressing Dressing Change Frequency Wound #8 Left,Anterior Lower Leg o Change dressing every day. Follow-up Appointments Wound #8 Left,Anterior Lower Leg o Return Appointment in 1 week. Edema Control Wound #8 Left,Anterior Lower Leg o Elevate legs to the level of the heart and pump ankles as often as possible Kowalke, Dwyne ([id number]) Medications-please add to medication list. Wound #8  Left,Anterior Lower Leg o Santyl Enzymatic Ointment Electronic Signature(s) Signed: 08/09/2016 12:00:13 PM By: Elpidio Eric BSN, RN Signed: 08/10/2016 8:09:12 AM By: Ardath Sax MD Entered By: Elpidio Eric on 08/09/2016 09:31:40 [name] [name] ([id number]) -------------------------------------------------------------------------------- Problem List Details Patient Name: [name] [name] Date of Service: 08/09/2016 8:45 AM Medical Record Number: [id number] Patient Account Number: [id number] Date of Birth/Sex: 1956-08-30 (60 y.o. 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 07/31/2016 Yes exposed Inactive Problems Resolved Problems ICD-10 Code Description Active Date Resolved Date L97.321 Non-pressure chronic ulcer of left ankle limited to 06/13/2016 06/13/2016 breakdown of skin L97.211 Non-pressure chronic ulcer of right calf limited to 06/19/2016 06/19/2016 breakdown of skin Electronic Signature(s) Signed: 08/09/2016 9:19:13 AM By: Ardath Sax MD Entered By: Ardath Sax on 08/09/2016 09:19:13 [name] [name] ([id number]) -------------------------------------------------------------------------------- Progress Note Details Patient Name: [name] [name] Date of Service: 08/09/2016 8:45 AM Medical Record Number: [id number] Patient Account Number: [id number] Date of Birth/Sex: Apr 30, 1956 (60 y.o. Male) Treating RN: Phillis Haggis Primary Care Physician: FITZGERALD, [name] Other Clinician: Referring Physician: FITZGERALD, [name] Treating Physician/Extender: Elayne Snare in  Treatment: 8 Subjective Chief Complaint Information obtained from Patient Right second toe wound and left lateral malleolar wound. is doing fine and has no fresh issues. He does have his appointment with the vascular surgeons. However he does not have an appointment with the orthopedic surgeons yet. he did not come for 2 weeks because of the bad weather. He has no fresh issues. 04/10/16; the patient returns today with a wound over the last 2-3 weeks on the anterior part of his left ankle. 07/31/2016 -- the patient was recently discharged 3 weeks ago and comes back with a reopened wound the left anterior  ankle has been there for about 2 weeks History of Present Illness (HPI) The following HPI elements were documented for the patient's wound: Location: left fourth toe Quality: Patient reports experiencing a dull pain to affected area(s). Severity: Patient states wound (s) are getting better. Duration: Patient states that they are not certain how long the wound has been present. Timing: Pain in wound is constant (hurts all the time) Context: The wound appeared gradually over time Associated Signs and Symptoms: nil has been doing fine and has no fresh issues. He does say that he is trying to get all his appointments in order and at present he has a appointment with the vascular surgeons. While at home he removes his walking shoe and keeps the leg and foot open. There is no pain and minimal drainage. 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 He tells me that over the last 2-3 weeks he has noticed pain and a wound on the crease of his left dorsal ankle and he is here for our review events. Because of the deformity in his ankle he has a  fracture boot that he uses the walker. This was made at [facility] he was in hospital in January and suffered a wide complex cardiac arrest at the time of the fistulagram. He has recovered from this. 04/17/16 a his left dorsal ankle appears to be improved. He has an appointment with [employer] to adjust his boot next week. He does not appear to have significant PAD but does have significant venous insufficiencywent 04/24/16; went to [employer] last week,did not adjust his boot due to edema. She has called his cardiologist and in the meantime they have apparently taken some extra fluid off him at dialysis. He dialyzes Monday Wednesday and Friday NIKOLAUS, PIENTA ([id number]) 05/01/16; the patient arrived with some scant surface eschar over his dorsal ankle wound. This was debridement. There is nothing but normal skin under this. His leg seems to have done well with a 2 layer compression we put on this last week accompanied by extra fluid removal at dialysis's resulted in less edema of his leg. He has chronic venous insufficiency with resultant edema been eating his chronic renal failure and deformity of his left ankle. 06/13/16; the patient arrives today with a recurrent wound in the same spot as last time. He has chronic venous insufficiency also deformity of the foot and ankle likely secondary to Charcot deformity. The patient has type 2 diabetes with polyneuropathy. He states the stockings he was wearing were too tight and he feels that's why it rubbed his ankle. He also has a Systems developer, that was adjusted last time. 06/19/16; Arrives today with a new wound on the right anterior leg. The original wound on the crease of his dorsal left ankle in the setting of chronic venous insufficiency and a Charcot deformity in this area is roughly the same. 06/27/16; the area on the right anterior leg is just about closed over, only 2 small areas remaining. The  original wound in the crease of the dorsal left ankle in the setting of chronic venous insufficiency and a Charcot deformity appears improved. The debridement of adherent surface slough and  nonviable subcutaneous tissue post debridement everything looks quite healthy here. 07/03/16: right lower leg wound healed. left lower leg wound improved. no reports of systemic s/s of infection. 07/10/16; right lower leg wound is healed. The area on his left anterior ankle covered in and eschar. 07/31/2016 -- the patient was recently discharged in the middle of July returns in about 3 weeks with a fresh wound which has reopened. He is a diabetic with chronic venous insufficiency and also a Charcot foot. he says he started wearing compression stockings which have caused him an abrasion on the deformed ankle. Not find any evidence of venous insufficiency in the past. Review of his vascular studies done in June 2016 showed a right ABI of 1.15 and a left ABI of 1.11 Objective Constitutional Vitals Time Taken: 8:47 AM, Height: 72 in, Temperature: 97.9 F, Pulse: 86 bpm, Respiratory Rate: 18 breaths/min, Blood Pressure: 101/66 mmHg. Integumentary (Hair, Skin) Wound #8 status is Open. Original cause of wound was Gradually Appeared. The wound is located on the Left,Anterior Lower Leg. The wound measures 0.8cm length x 2.3cm width x 0.2cm depth; 1.445cm^2 area and 0.289cm^3 volume. The wound is limited to skin breakdown. There is no tunneling or undermining noted. There is a large amount of serosanguineous drainage noted. The wound margin is distinct with the outline attached to the wound base. There is large (67-100%) pink, pale granulation within the wound bed. There is a small (1-33%) amount of necrotic tissue within the wound bed including Adherent Slough. The periwound skin appearance exhibited: Localized Edema, Moist. The periwound skin appearance did not exhibit: Callus, Crepitus, Excoriation, Fluctuance,  Friable, Induration, Rash, Scarring, Dry/Scaly, Maceration, Atrophie Blanche, Cyanosis, Ecchymosis, Hemosiderin Staining, Mottled, Pallor, Rubor, Erythema. Periwound temperature was noted as No Abnormality. The periwound has tenderness on Weathers, Treyten ([id number]) palpation. Assessment Active Problems ICD-10 E11.42 - Type 2 diabetes mellitus with diabetic polyneuropathy M14.672 - Charcot's joint, left ankle and foot L97.322 - Non-pressure chronic ulcer of left ankle with fat layer exposed Procedures Wound #8 Wound #8 is a Diabetic Wound/Ulcer of the Lower Extremity located on the Left,Anterior Lower Leg . There was a Skin/Subcutaneous Tissue Debridement ([id number]) debridement with total area of 1.84 sq cm performed by Ardath Sax, MD. with the following instrument(s): Curette to remove Non-Viable tissue/material including Fibrin/Slough and Subcutaneous after achieving pain control using Lidocaine 4% Topical Solution. A time out was conducted prior to the start of the procedure. A Minimum amount of bleeding was controlled with Pressure. The procedure was tolerated well with a pain level of 0 throughout and a pain level of 0 following the procedure. Post Debridement Measurements: 0.8cm length x 2.3cm width x 0.3cm depth; 0.434cm^3 volume. Post procedure Diagnosis Wound #8: Same as Pre-Procedure Small pressure ulcer from comression hose dorsal left ankle. Debrided and dressed with Santyl.. At home will use Silver alginate due to cost Plan Wound Cleansing: Wound #8 Left,Anterior Lower Leg: Clean wound with Normal Saline. May Shower, gently pat wound dry prior to applying new dressing. Anesthetic: Wound #8 Left,Anterior Lower Leg: Skidgel, Opal ([id number]) Topical Lidocaine 4% cream applied to wound bed prior to debridement Skin Barriers/Peri-Wound Care: Wound #8 Left,Anterior Lower Leg: Skin Prep Primary Wound Dressing: Wound #8 Left,Anterior Lower Leg: Santyl Ointment - in the  clinic Aquacel Ag - when next dressing change is due Secondary Dressing: Wound #8 Left,Anterior Lower Leg: Boardered Foam Dressing Dressing Change Frequency: Wound #8 Left,Anterior Lower Leg: Change dressing every day. Follow-up Appointments: Wound #8 Left,Anterior Lower Leg: Return Appointment  in 1 week. Edema Control: Wound #8 Left,Anterior Lower Leg: Elevate legs to the level of the heart and pump ankles as often as possible Medications-please add to medication list.: Wound #8 Left,Anterior Lower Leg: Santyl Enzymatic Ointment Follow-Up Appointments: A follow-up appointment should be scheduled. Medication Reconciliation completed and provided to Patient/Care Provider. Electronic Signature(s) Signed: 08/09/2016 9:22:09 AM By: Ardath SaxParker, Ailin Rochford MD Entered By: Ardath SaxParker, Davon Folta on 08/09/2016 09:22:08 [name] [name], [name] [name] ([id number]) -------------------------------------------------------------------------------- SuperBill Details Patient Name: [name] [name], [name] [name] Date of Service: 08/09/2016 Medical Record Number: [id number] Patient Account Number: [id number] Date of Birth/Sex: 09/26/1956 [age] y.o. Male) Treating RN: Phillis HaggisPinkerton, Debi Primary Care Physician: FITZGERALD, [name] Other Clinician: Referring Physician: FITZGERALD, [name] Treating Physician/Extender: Elayne SnarePARKER, Kyarra Vancamp Weeks in Treatment: 8 Diagnosis Coding ICD-10 Codes Code Description E11.42 Type 2 diabetes mellitus with diabetic polyneuropathy M14.672 Charcot's joint, left ankle and foot L97.322 Non-pressure chronic ulcer of left ankle with fat layer exposed Facility Procedures CPT4 Code: [id number] Description: 11042 - DEB SUBQ TISSUE 20 SQ CM/< ICD-10 Description Diagnosis M14.672 Charcot's joint, left ankle and foot Modifier: Quantity: 1 Physician Procedures CPT4 Code: [id number] Description: 99213 - WC PHYS LEVEL 3 - EST PT ICD-10 Description Diagnosis E11.42 Type 2 diabetes mellitus with diabetic polyneur Modifier: opathy Quantity: 1 CPT4  Code: [id number] Description: 11042 - WC PHYS SUBQ TISS 20 SQ CM ICD-10 Description Diagnosis M14.672 Charcot's joint, left ankle and foot Modifier: Quantity: 1 Electronic Signature(s) Signed: 08/09/2016 9:22:46 AM By: Ardath SaxParker, Torres Hardenbrook MD Entered By: Ardath SaxParker, Jhoselyn Ruffini on 08/09/2016 09:22:46

## 2016-08-14 ENCOUNTER — Encounter: Payer: Medicare Other | Admitting: Internal Medicine

## 2016-08-14 DIAGNOSIS — L97322 Non-pressure chronic ulcer of left ankle with fat layer exposed: Secondary | ICD-10-CM | POA: Diagnosis not present

## 2016-08-14 NOTE — Progress Notes (Addendum)
[name] [name], Denis ([id number]) Visit Report for 08/14/2016 Arrival Information Details Patient Name: [name] [name], [name] [name] Date of Service: 08/14/2016 2:45 PM Medical Record Number: [id number] Patient Account Number: [id number] Date of Birth/Sex: 12/20/1956 [age] y.o. Male) Treating RN: Clover MealyAfful, RN, BSN, [facility] Primary Care Physician: FITZGERALD, [name] Other Clinician: Referring Physician: FITZGERALD, [name] Treating Physician/Extender: Altamese CarolinaOBSON, MICHAEL G Weeks in Treatment: 8 Visit Information History Since Last Visit All ordered tests and consults were completed: No Patient Arrived: Wheel Chair Added or deleted any medications: No Arrival Time: 14:53 Any new allergies or adverse reactions: No Accompanied By: self Had a fall or experienced change in No activities of daily living that may affect Transfer Assistance: None risk of falls: Patient Identification Verified: Yes Signs or symptoms of abuse/neglect since last No Secondary Verification Process Yes visito Completed: Hospitalized since last visit: No Patient Requires Transmission-Based No Has Dressing in Place as Prescribed: Yes Precautions: Pain Present Now: No Patient Has Alerts: No Electronic Signature(s) Signed: 08/14/2016 4:21:51 PM By: Elpidio EricAfful, Rita BSN, RN Entered By: Elpidio EricAfful, Rita on 08/14/2016 14:54:13 [name] [name], [name] [name] ([id number]) -------------------------------------------------------------------------------- Encounter Discharge Information Details Patient Name: [name] [name], [name] [name] Date of Service: 08/14/2016 2:45 PM Medical Record Number: [id number] Patient Account Number: [id number] Date of Birth/Sex: 10/07/1956 [age] y.o. Male) Treating RN: Clover MealyAfful, RN, BSN, Rita Primary Care Physician: FITZGERALD, [name] Other Clinician: Referring Physician: FITZGERALD, [name] Treating Physician/Extender: Altamese CarolinaOBSON, MICHAEL G Weeks in Treatment: 8 Encounter Discharge Information Items Discharge Pain Level: 0 Discharge Condition: Stable Ambulatory Status:  Wheelchair Discharge Destination: Home Transportation: Other Accompanied By: self Schedule Follow-up Appointment: No Medication Reconciliation completed and provided to Patient/Care No Xachary Hambly: Provided on Clinical Summary of Care: 08/14/2016 Form Type Recipient Paper Patient TA Electronic Signature(s) Signed: 08/14/2016 3:27:30 PM By: Gwenlyn PerkingMoore, Shelia Entered By: Gwenlyn PerkingMoore, Shelia on 08/14/2016 15:27:30 [name] [name], [name] [name] ([id number]) -------------------------------------------------------------------------------- Lower Extremity Assessment Details Patient Name: [name] [name], [name] [name] Date of Service: 08/14/2016 2:45 PM Medical Record Number: [id number] Patient Account Number: [id number] Date of Birth/Sex: 11/30/1956 [age] y.o. Male) Treating RN: Clover MealyAfful, RN, BSN, Rita Primary Care Physician: FITZGERALD, [name] Other Clinician: Referring Physician: FITZGERALD, [name] Treating Physician/Extender: Maxwell CaulOBSON, MICHAEL G Weeks in Treatment: 8 Edema Assessment Assessed: [Left: No] [Right: No] E[Left: dema] [Right: :] Calf Left: Right: Point of Measurement: 36 cm From Medial Instep 40 cm cm Ankle Left: Right: Point of Measurement: 14 cm From Medial Instep 32 cm cm Vascular Assessment Claudication: Claudication Assessment [Left:None] Pulses: Posterior Tibial Extremity colors, hair growth, and conditions: Extremity Color: [Left:Dusky] Hair Growth on Extremity: [Left:No] Temperature of Extremity: [Left:Warm] Capillary Refill: [Left:< 3 seconds] Toe Nail Assessment Left: Right: Thick: Yes Discolored: Yes Deformed: No Improper Length and Hygiene: No Electronic Signature(s) Signed: 08/14/2016 4:21:51 PM By: Elpidio EricAfful, Rita BSN, RN Entered By: Elpidio EricAfful, Rita on 08/14/2016 14:55:59 [name] [name], [name] [name] ([id number]) -------------------------------------------------------------------------------- Multi Wound Chart Details Patient Name: [name] [name], [name] [name] Date of Service: 08/14/2016 2:45 PM Medical Record Number: [id number] Patient  Account Number: [id number] Date of Birth/Sex: 11/25/1956 [age] y.o. 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] Comorbid History: [8:Cataracts, Anemia, Arrhythmia, Congestive Heart Failure, Coronary Artery Disease, Hypertension, Myocardial Infarction, Type II Diabetes, End Stage Renal Disease, Osteoarthritis, Neuropathy] [N/A:N/A] Date Acquired: [  8:07/24/2016] [N/A:N/A] Weeks of Treatment: [8:2] [N/A:N/A] Wound Status: [8:Open] [N/A:N/A] Measurements L x W x D 0.8x2.3x0.2 [N/A:N/A] (cm) Area (cm) : [8:1.445] [N/A:N/A] Volume (cm) : [8:0.289] [N/A:N/A] % Reduction in Area: [8:45.90%] [N/A:N/A] % Reduction in Volume: 45.90% [N/A:N/A] Classification: [8:Grade 1] [N/A:N/A] Exudate Amount: [8:Large] [N/A:N/A] Exudate Type: [8:Serosanguineous] [N/A:N/A] Exudate Color: [8:red, brown] [N/A:N/A] Wound Margin: [8:Distinct, outline attached N/A] Granulation Amount: [8:Medium (34-66%)] [N/A:N/A] Granulation Quality: [8:Pink, Pale] [N/A:N/A] Necrotic Amount: Medium (34-66%) N/A N/A Exposed Structures: Fascia: No N/A N/A Fat: No Tendon: No Muscle: No Joint: No Bone: No Limited to Skin Breakdown Epithelialization: Small (1-33%) N/A N/A Periwound Skin Texture: Edema: Yes N/A N/A Excoriation: No Induration: No Callus: No Crepitus: No Fluctuance: No Friable: No Rash: No Scarring: No Periwound Skin Moist: Yes N/A N/A Moisture: Maceration: No Dry/Scaly:  No Periwound Skin Color: Atrophie Blanche: No N/A N/A Cyanosis: No Ecchymosis: No Erythema: No Hemosiderin Staining: No Mottled: No Pallor: No Rubor: No Temperature: No Abnormality N/A N/A Tenderness on Yes N/A N/A Palpation: Wound Preparation: Ulcer Cleansing: N/A N/A Rinsed/Irrigated with Saline Topical Anesthetic Applied: Other: Lidocaine 4% Treatment Notes Electronic Signature(s) Signed: 08/14/2016 4:21:51 PM By: Elpidio EricAfful, Rita BSN, RN Entered By: Elpidio EricAfful, Rita on 08/14/2016 15:14:47 [name] [name], Jerimey ([id number]) -------------------------------------------------------------------------------- Multi-Disciplinary Care Plan Details Patient Name: [name] [name], [name] [name] Date of Service: 08/14/2016 2:45 PM Medical Record Number: [id number] Patient Account Number: [id number] Date of Birth/Sex: 06/12/1956 [age] y.o. Male) Treating RN: Clover MealyAfful, RN, BSN[facility] Primary Care Physician: FITZGERALD, [name] Other Clinician: Referring Physician: FITZGERALD, [name] Treating Physician/Extender: Altamese CarolinaOBSON, MICHAEL G Weeks in Treatment: 8 Active Inactive Electronic Signature(s) Signed: 08/14/2016 4:21:51 PM By: Elpidio EricAfful, Rita BSN, RN Entered By: Elpidio EricAfful, Rita on 08/14/2016 15:14:29 [name] [name], [name] [name] ([id number]) -------------------------------------------------------------------------------- Pain Assessment Details Patient Name: [name] [name], Seung Date of Service: 08/14/2016 2:45 PM Medical Record Number: [id number] Patient Account Number: [id number] Date of Birth/Sex: 05/08/1956 [age] y.o. Male) Treating RN: Clover MealyAfful, RN, BSN, [facility] Primary Care Physician: FITZGERALD, [name] Other Clinician: Referring Physician: FITZGERALD, [name] Treating Physician/Extender: Maxwell CaulOBSON, MICHAEL G Weeks in Treatment: 8 Active Problems Location of Pain Severity and Description of Pain Patient Has Paino No Site Locations With Dressing Change: No Pain Management and Medication Current Pain Management: Electronic Signature(s) Signed: 08/14/2016 4:21:51 PM  By: Elpidio EricAfful, Rita BSN, RN Entered By: Elpidio EricAfful, Rita on 08/14/2016 14:54:22 [name] [name], Deran ([id number]) -------------------------------------------------------------------------------- Patient/Caregiver Education Details Patient Name: [name] [name], Kwame Date of Service: 08/14/2016 2:45 PM Medical Record Number: [id number] Patient Account Number: [id number] Date of Birth/Gender: 07/13/1956 [age] y.o. Male) Treating RN: Clover MealyAfful, RN, BSN, Rita Primary Care Physician: FITZGERALD, [name] Other Clinician: Referring Physician: FITZGERALD, [name] Treating Physician/Extender: Altamese CarolinaOBSON, MICHAEL G Weeks in Treatment: 8 Education Assessment Education Provided To: Patient and Caregiver Education Topics Provided Basic Hygiene: Methods: Demonstration Responses: State content correctly Wound Debridement: Methods: Explain/Verbal Responses: State content correctly Wound/Skin Impairment: Methods: Explain/Verbal Responses: State content correctly Electronic Signature(s) Signed: 08/14/2016 4:21:51 PM By: Elpidio EricAfful, Rita BSN, RN Entered By: Elpidio EricAfful, Rita on 08/14/2016 15:31:02 [name] [name], Jehu ([id number]) -------------------------------------------------------------------------------- Wound Assessment Details Patient Name: [name] [name], Herbie Date of Service: 08/14/2016 2:45 PM Medical Record Number: [id number] Patient Account Number: [id number] Date of Birth/Sex: 03/23/1956 [age] y.o. Male) Treating RN: Clover MealyAfful, RN, BSN, Rita Primary Care Physician: FITZGERALD, [name] Other Clinician: Referring Physician: FITZGERALD, [name] Treating Physician/Extender: Maxwell CaulOBSON, MICHAEL G Weeks in Treatment: 8 Wound Status Wound Number: 8 Primary Diabetic Wound/Ulcer of the Lower Etiology: Extremity Wound Location: Left Lower Leg - Anterior Wound Open Wounding Event: Gradually Appeared Status: Date Acquired: 07/24/2016 Comorbid Cataracts, Anemia, Arrhythmia, Weeks Of Treatment: 2  History: Congestive Heart Failure, Coronary Clustered Wound: No Artery  Disease, Hypertension, Myocardial Infarction, Type II Diabetes, End Stage Renal Disease, Osteoarthritis, Neuropathy Photos Photo Uploaded By: Elpidio Eric on 08/14/2016 16:26:49 Wound Measurements Length: (cm) 0.8 Width: (cm) 2.3 Depth: (cm) 0.2 Area: (cm) 1.445 Volume: (cm) 0.289 % Reduction in Area: 45.9% % Reduction in Volume: 45.9% Epithelialization: Small (1-33%) Tunneling: No Undermining: No Wound Description Classification: Grade 1 Wound Margin: Distinct, outline attached Exudate Amount: Large Exudate Type: Serosanguineous Exudate Color: red, brown Foul Odor After Cleansing: No Wound Bed Granulation Amount: Medium (34-66%) Exposed Structure [place], Khadim ([id number]) Granulation Quality: Pink, Pale Fascia Exposed: No Necrotic Amount: Medium (34-66%) Fat Layer Exposed: No Necrotic Quality: Adherent Slough Tendon Exposed: No Muscle Exposed: No Joint Exposed: No Bone Exposed: No Limited to Skin Breakdown Periwound Skin Texture Texture Color No Abnormalities Noted: No No Abnormalities Noted: No Callus: No Atrophie Blanche: No Crepitus: No Cyanosis: No Excoriation: No Ecchymosis: No Fluctuance: No Erythema: No Friable: No Hemosiderin Staining: No Induration: No Mottled: No Localized Edema: Yes Pallor: No Rash: No Rubor: No Scarring: No Temperature / Pain Moisture Temperature: No Abnormality No Abnormalities Noted: No Tenderness on Palpation: Yes Dry / Scaly: No Maceration: No Moist: Yes Wound Preparation Ulcer Cleansing: Rinsed/Irrigated with Saline Topical Anesthetic Applied: Other: Lidocaine 4%, Treatment Notes Wound #8 (Left, Anterior Lower Leg) 1. Cleansed with: Clean wound with Normal Saline 4. Dressing Applied: Aquacel Ag 5. Secondary Dressing Applied ABD Pad 7. Secured with 2 Layer Lite Compression System - Left Lower Extremity Electronic Signature(s) Signed: 08/14/2016 4:21:51 PM By: Elpidio Eric BSN, RN Entered By: Elpidio Eric  on 08/14/2016 15:01:33 [name] Mura ([id number]) -------------------------------------------------------------------------------- Vitals Details Patient Name: [name] Mura Date of Service: 08/14/2016 2:45 PM Medical Record Number: [id number] Patient Account Number: [id number] Date of Birth/Sex: Nov 25, 1956 (60 y.o. Male) Treating RN: Clover Mealy, RN, BSN, Rita Primary Care Physician: FITZGERALD, [name] Other Clinician: Referring Physician: FITZGERALD, [name] Treating Physician/Extender: Maxwell Caul Weeks in Treatment: 8 Vital Signs Time Taken: 14:56 Temperature (F): 97.8 Height (in): 72 Pulse (bpm): 89 Respiratory Rate (breaths/min): 17 Blood Pressure (mmHg): 94/56 Reference Range: 80 - 120 mg / dl Electronic Signature(s) Signed: 08/14/2016 4:21:51 PM By: Elpidio Eric BSN, RN Entered By: Elpidio Eric on 08/14/2016 14:56:35

## 2016-08-15 NOTE — Progress Notes (Signed)
TINY, RIETZ ([id number]) Visit Report for 08/14/2016 Chief Complaint Document Details Patient Name: [name] [name], [name] [name] Date of Service: 08/14/2016 2:45 PM Medical Record Patient Account Number: [id number] Number: Treating RN: Clover Mealy, RN, BSN, Rita January 05, 1956 [phone] y.o. Other Clinician: Date of Birth/Sex: Male) Treating [name] [name] Primary Care Physician: [name], [name] Physician/Extender: G Referring Physician: FITZGERALD, [name] Weeks in Treatment: 8 Information Obtained from: Patient Chief Complaint Right second toe wound and left lateral malleolar wound. is doing fine and has no fresh issues. He does have his appointment with the vascular surgeons. However he does not have an appointment with the orthopedic surgeons yet. he did not come for 2 weeks because of the bad weather. He has no fresh issues. 04/10/16; the patient returns today with a wound over the last 2-3 weeks on the anterior part of his left ankle. 07/31/2016 -- the patient was recently discharged 3 weeks ago and comes back with a reopened wound the left anterior ankle has been there for about 2 weeks Electronic Signature(s) Signed: 08/15/2016 7:31:48 AM By: Baltazar Najjar MD Entered By: Baltazar Najjar on 08/15/2016 07:21:33 [name] [name] ([id number]) -------------------------------------------------------------------------------- Debridement Details Patient Name: [name] [name] Date of Service: 08/14/2016 2:45 PM Medical Record Patient Account Number: [id number] Number: Treating RN: Clover Mealy, RN, BSN, Rita 01/20/1956 [phone] y.o. Other Clinician: Date of Birth/Sex: Male) Treating [name] [name] Primary Care Physician: [name], [name] Physician/Extender: G Referring Physician: FITZGERALD, [name] Weeks in Treatment: 8 Debridement Performed for Wound #8 Left,Anterior Lower Leg Assessment: Performed By: Physician Maxwell Caul, MD Debridement: Debridement Pre-procedure Yes - 15:10 Verification/Time  Out Taken: Start Time: 15:10 Pain Control: Lidocaine 4% Topical Solution Level: Skin/Subcutaneous Tissue Total Area Debrided (L x 0.8 (cm) x 2.3 (cm) = 1.84 (cm) W): Tissue and other Non-Viable, Fibrin/Slough, Subcutaneous material debrided: Instrument: Curette Bleeding: Minimum Hemostasis Achieved: Pressure End Time: 15:15 Procedural Pain: 0 Post Procedural Pain: 0 Response to Treatment: Procedure was tolerated well Post Debridement Measurements of Total Wound Length: (cm) 0.8 Width: (cm) 2.3 Depth: (cm) 0.2 Volume: (cm) 0.289 Character of Wound/Ulcer Post Stable Debridement: Severity of Tissue Post Debridement: Limited to breakdown of skin Post Procedure Diagnosis Same as Pre-procedure Electronic Signature(s) Signed: 08/14/2016 4:21:51 PM By: Elpidio Eric BSN, RN Signed: 08/15/2016 7:31:48 AM By: Baltazar Najjar MD [place], Aurther Loft ([id number]) Entered By: Elpidio Eric on 08/14/2016 15:16:53 [name] [name] ([id number]) -------------------------------------------------------------------------------- HPI Details Patient Name: [name] [name] Date of Service: 08/14/2016 2:45 PM Medical Record Patient Account Number: [id number] Number: Treating RN: Clover Mealy, RN, BSN, Rita [birth year] (59 y.o. Other Clinician: Date of Birth/Sex: Male) Treating [name] [name] Primary Care Physician: [name], [name] Physician/Extender: G Referring Physician: FITZGERALD, [name] Weeks in Treatment: 8 History of Present Illness Location: left fourth toe Quality: Patient reports experiencing a dull pain to affected area(s). Severity: Patient states wound (s) are getting better. Duration: Patient states that they are not certain how long the wound has been present. Timing: Pain in wound is constant (hurts all the time) Context: The wound appeared gradually over time Associated Signs and Symptoms: nil HPI Description: has been doing fine and has no fresh issues. He does say that he is trying to  get all his appointments in order and at present he has a appointment with the vascular surgeons. While at home he removes his walking shoe and keeps the leg and foot open. There is no pain and minimal drainage. 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 He tells me that over the last 2-3 weeks he has noticed pain and a wound on the crease of his left dorsal ankle and he is here for our review events. Because of the deformity in his ankle he has a fracture boot that he uses the walker. [facility] [place] [facility] he was in hospital in January and suffered a wide complex cardiac arrest at the time of the fistulagram. He has recovered from this. 04/17/16 a his left dorsal ankle appears to be improved. He has an appointment with [facility] to adjust his boot next week. He does not appear to have significant PAD but does have significant venous insufficiencywent 04/24/16; went to [facility] last week,did not adjust his boot due to edema. She has called his cardiologist and in the meantime they have apparently taken some extra fluid off him at dialysis. He dialyzes Monday Wednesday and Friday 05/01/16; the patient arrived with some scant surface eschar over his dorsal ankle wound. This was debridement. There is nothing but normal skin under this. His leg seems to have done well with a 2 layer compression we put on this last week accompanied by extra fluid removal at dialysis's resulted in less edema of his leg. He has chronic venous insufficiency with resultant edema been eating his chronic renal failure and deformity of his left ankle. 06/13/16; the patient arrives today with a recurrent wound  in the same spot as last time. He has chronic venous insufficiency also deformity of the foot and ankle likely secondary to Charcot deformity. The patient has type 2 diabetes with polyneuropathy. He states the stockings he was wearing were too tight and he feels that's why it rubbed his ankle. He also has a Systems developerCharcot boot, that was adjusted last time. [name] MuraBNEY, Delbert ([id number]) 06/19/16; Arrives today with a new wound on the right anterior leg. The original wound on the crease of his dorsal left ankle in the setting of chronic venous insufficiency and a Charcot deformity in this area is roughly the same. 06/27/16; the area on the right anterior leg is just about closed over, only 2 small areas remaining. The original wound in the crease of the dorsal left ankle in the setting of chronic venous insufficiency and a Charcot deformity appears improved. The debridement of adherent surface slough and nonviable subcutaneous tissue post debridement everything looks quite healthy here. 07/03/16: right lower leg wound healed. left lower leg wound improved. no reports of systemic s/s of infection. 07/10/16; right lower leg wound is healed. The area on his left anterior ankle covered in and eschar. 07/31/2016 -- the patient was recently discharged in the middle of July returns in about 3 weeks with a fresh wound which has reopened. He is a diabetic with chronic venous insufficiency and also a Charcot foot. he says he started wearing compression stockings which have caused him an abrasion on the deformed ankle. Not find any evidence of venous insufficiency in the past. Review of his vascular studies done in June 2016 showed a right ABI of 1.15 and a left ABI of 1.11 08/14/16; readmission noted; wound debrided. This does not look unhealthy. The patient states that the wound happened 2 days after he purchased some form of compression stocking, perhaps rolled up  and irritated this area although this simply may be an area  of compression in his custom boot. Electronic Signature(s) Signed: 08/15/2016 7:31:48 AM By: Baltazar Najjar MD Entered By: Baltazar Najjar on 08/15/2016 07:23:21 [name] [name] ([id number]) -------------------------------------------------------------------------------- Physical Exam Details Patient Name: [name] [name] Date of Service: 08/14/2016 2:45 PM Medical Record Patient Account Number: [id number] Number: Treating RN: Clover Mealy, RN, BSN, Rita [birth year] [phone] y.o. Other Clinician: Date of Birth/Sex: Male) Treating Lashondra Vaquerano Primary Care Physician: [name], [name] Physician/Extender: G Referring Physician: FITZGERALD, [name] Weeks in Treatment: 8 Constitutional Patient is hypotensive.. Pulse regular and within target range for patient.Marland Kitchen Respirations regular, non-labored and within target range.. Temperature is normal and within the target range for the patient.. Patient appears well. Cardiovascular Pedal pulses palpable and strong bilaterally.. Edema present in both extremities. Severe venous insufficiency probably some degree of lymphedema. Notes Wound exam; once again there is chronic venous insufficiency probably some degree of lymphedema. The patient has a Charcot foot with an inverted left ankle. The wound is in the anterior/dorsal crease of the left ankle. This requires debridement of surface slough nonviable subcutaneous tissue afterwards this cleans up quite nicely. Electronic Signature(s) Signed: 08/15/2016 7:31:48 AM By: Baltazar Najjar MD Entered By: Baltazar Najjar on 08/15/2016 07:25:36 [name] [name] ([id number]) -------------------------------------------------------------------------------- Physician Orders Details Patient Name: [name] [name] Date of Service: 08/14/2016 2:45 PM Medical Record Patient Account Number: [id number] Number: Treating RN: Clover Mealy, RN, BSN, Rita 04-07-1956 [phone] y.o. Other Clinician: Date of Birth/Sex: Male) Treating  Jermaine Neuharth Primary Care Physician: [name], [name] Physician/Extender: G Referring Physician: FITZGERALD, [name] Weeks in Treatment: 8 Verbal / Phone Orders: Yes Clinician: Afful, RN, BSN, Rita Read Back and Verified: Yes Diagnosis Coding Wound Cleansing Wound #8 Left,Anterior Lower Leg o Clean wound with Normal Saline. o May Shower, gently pat wound dry prior to applying new dressing. Anesthetic Wound #8 Left,Anterior Lower Leg o Topical Lidocaine 4% cream applied to wound bed prior to debridement Skin Barriers/Peri-Wound Care Wound #8 Left,Anterior Lower Leg o Skin Prep Primary Wound Dressing Wound #8 Left,Anterior Lower Leg o Aquacel Ag - when next dressing change is due Secondary Dressing Wound #8 Left,Anterior Lower Leg o Boardered Foam Dressing Dressing Change Frequency Wound #8 Left,Anterior Lower Leg o Change dressing every day. Follow-up Appointments Wound #8 Left,Anterior Lower Leg o Return Appointment in 1 week. Edema Control Wound #8 Left,Anterior Lower Leg o Elevate legs to the level of the heart and pump ankles as often as possible Hottle, Keyon ([id number]) Electronic Signature(s) Signed: 08/14/2016 4:21:51 PM By: Elpidio Eric BSN, RN Signed: 08/15/2016 7:31:48 AM By: Baltazar Najjar MD Entered By: Elpidio Eric on 08/14/2016 15:17:33 [name] [name] ([id number]) -------------------------------------------------------------------------------- Problem List Details Patient Name: [name] [name] Date of Service: 08/14/2016 2:45 PM Medical Record Patient Account Number: [id number] Number: Treating RN: Clover Mealy, RN, BSN, Rita February 26, 1956 [phone] y.o. 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 07/31/2016 Yes exposed Inactive Problems Resolved Problems ICD-10 Code Description Active Date Resolved Date L97.321 Non-pressure chronic ulcer of left ankle limited to 06/13/2016 06/13/2016 breakdown of skin L97.211 Non-pressure chronic ulcer of right calf limited to 06/19/2016 06/19/2016 breakdown of skin Electronic Signature(s) Signed: 08/15/2016 7:31:48 AM By: Baltazar Najjar  MD Entered By: Baltazar Najjar on 08/15/2016 07:20:57 [name] [name] ([id number]) -------------------------------------------------------------------------------- Progress Note Details Patient Name: [name] [name] Date of Service: 08/14/2016 2:45 PM Medical Record Patient Account Number: [id number] Number: Treating RN: Clover Mealy, RN, BSN, Rita 17-Jul-1956 [phone] y.o. Other Clinician: Date of Birth/Sex: Male) Treating Jamye Balicki Primary Care Physician: [name], [name] Physician/Extender: G Referring Physician: FITZGERALD, [name] Weeks in Treatment: 8 Subjective Chief Complaint Information obtained from Patient Right second toe wound and left lateral malleolar wound. is doing fine and has no fresh issues. He does have his appointment with the vascular surgeons. However he does not have an appointment with the orthopedic surgeons yet. he did not come for 2 weeks because of the bad weather. He has no fresh issues. 04/10/16; the patient returns today with a wound over the last 2-3 weeks on the anterior part of his left ankle. 07/31/2016 -- the patient was recently discharged 3 weeks ago and comes back with a reopened wound the left anterior ankle has been there for about 2 weeks History of Present Illness (HPI) The following HPI elements were documented for the patient's wound: Location: left fourth toe Quality: Patient reports  experiencing a dull pain to affected area(s). Severity: Patient states wound (s) are getting better. Duration: Patient states that they are not certain how long the wound has been present. Timing: Pain in wound is constant (hurts all the time) Context: The wound appeared gradually over time Associated Signs and Symptoms: nil has been doing fine and has no fresh issues. He does say that he is trying to get all his appointments in order and at present he has a appointment with the vascular surgeons. While at home he removes his walking shoe and keeps the leg and foot open. There is no pain and minimal drainage. 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 He tells me that over the last 2-3 weeks he has noticed pain and a wound on the crease of his left dorsal ankle and he is here for our review events. Because of the deformity in his ankle he has a fracture boot that he uses the walker. This was [facility] at [facility]. [facility] he was in hospital in January and suffered a wide complex cardiac arrest at the time of the fistulagram. He has recovered from this. 04/17/16 a his left dorsal ankle appears to be improved. He has an appointment with [facility] to adjust his boot next week. He does not appear to have significant PAD but does have significant venous insufficiencywent 04/24/16; went to [facility] last week,did not adjust his boot due to edema. She has called his cardiologist and Henken, Jerren ([id number]) in the meantime they have apparently taken some extra fluid off him at dialysis. He dialyzes Monday Wednesday and Friday 05/01/16; the patient arrived with some scant surface eschar over his dorsal  ankle wound. This was debridement. There is nothing but normal skin under this. His leg seems to have done well with a 2 layer compression we put on this last week accompanied by extra fluid removal at dialysis's resulted in less edema of his leg. He has chronic venous insufficiency with resultant edema been eating his chronic  renal failure and deformity of his left ankle. 06/13/16; the patient arrives today with a recurrent wound in the same spot as last time. He has chronic venous insufficiency also deformity of the foot and ankle likely secondary to Charcot deformity. The patient has type 2 diabetes with polyneuropathy. He states the stockings he was wearing were too tight and he feels that's why it rubbed his ankle. He also has a Systems developer, that was adjusted last time. 06/19/16; Arrives today with a new wound on the right anterior leg. The original wound on the crease of his dorsal left ankle in the setting of chronic venous insufficiency and a Charcot deformity in this area is roughly the same. 06/27/16; the area on the right anterior leg is just about closed over, only 2 small areas remaining. The original wound in the crease of the dorsal left ankle in the setting of chronic venous insufficiency and a Charcot deformity appears improved. The debridement of adherent surface slough and nonviable subcutaneous tissue post debridement everything looks quite healthy here. 07/03/16: right lower leg wound healed. left lower leg wound improved. no reports of systemic s/s of infection. 07/10/16; right lower leg wound is healed. The area on his left anterior ankle covered in and eschar. 07/31/2016 -- the patient was recently discharged in the middle of July returns in about 3 weeks with a fresh wound which has reopened. He is a diabetic with chronic venous insufficiency and also a Charcot foot. he says he started wearing compression stockings which have caused him an abrasion on the deformed ankle. Not  find any evidence of venous insufficiency in the past. Review of his vascular studies done in June 2016 showed a right ABI of 1.15 and a left ABI of 1.11 08/14/16; readmission noted; wound debrided. This does not look unhealthy. The patient states that the wound happened 2 days after he purchased some form of compression stocking, perhaps rolled up and irritated this area although this simply may be an area of compression in his custom boot. Objective Constitutional Patient is hypotensive.. Pulse regular and within target range for patient.Marland Kitchen Respirations regular, non-labored and within target range.. Temperature is normal and within the target range for the patient.. Patient appears well. Vitals Time Taken: 2:56 PM, Height: 72 in, Temperature: 97.8 F, Pulse: 89 bpm, Respiratory Rate: 17 breaths/min, Blood Pressure: 94/56 mmHg. Cardiovascular Consolo, Edwen ([id number]) Pedal pulses palpable and strong bilaterally.. Edema present in both extremities. Severe venous insufficiency probably some degree of lymphedema. General Notes: Wound exam; once again there is chronic venous insufficiency probably some degree of lymphedema. The patient has a Charcot foot with an inverted left ankle. The wound is in the anterior/dorsal crease of the left ankle. This requires debridement of surface slough nonviable subcutaneous tissue afterwards this cleans up quite nicely. Integumentary (Hair, Skin) Wound #8 status is Open. Original cause of wound was Gradually Appeared. The wound is located on the Left,Anterior Lower Leg. The wound measures 0.8cm length x 2.3cm width x 0.2cm depth; 1.445cm^2 area and 0.289cm^3 volume. The wound is limited to skin breakdown. There is no tunneling or undermining noted. There is a large amount of serosanguineous drainage noted. The wound margin is distinct with the outline attached to the wound base. There is medium (34-66%) pink, pale granulation within the wound bed. There is a  medium (34-66%) amount of necrotic tissue within the wound bed including Adherent Slough. The periwound skin appearance exhibited: Localized Edema, Moist. The periwound skin appearance did not exhibit: Callus,  Crepitus, Excoriation, Fluctuance, Friable, Induration, Rash, Scarring, Dry/Scaly, Maceration, Atrophie Blanche, Cyanosis, Ecchymosis, Hemosiderin Staining, Mottled, Pallor, Rubor, Erythema. Periwound temperature was noted as No Abnormality. The periwound has tenderness on palpation. Assessment Active Problems ICD-10 E11.42 - Type 2 diabetes mellitus with diabetic polyneuropathy M14.672 - Charcot's joint, left ankle and foot L97.322 - Non-pressure chronic ulcer of left ankle with fat layer exposed Procedures Wound #8 Wound #8 is a Diabetic Wound/Ulcer of the Lower Extremity located on the Left,Anterior Lower Leg . There was a Skin/Subcutaneous Tissue Debridement ([id number]) debridement with total area of 1.84 sq cm performed by Maxwell Caul, MD. with the following instrument(s): Curette to remove Non-Viable tissue/material including Fibrin/Slough and Subcutaneous after achieving pain control using Lidocaine 4% Topical Solution. A time out was conducted at 15:10, prior to the start of the procedure. A Minimum amount of bleeding was controlled with Pressure. The procedure was tolerated well with a pain level of 0 throughout and a pain level of 0 following the procedure. Post Debridement Measurements: 0.8cm length x 2.3cm width x 0.2cm depth; 0.289cm^3 volume. EFREN, KROSS ([id number]) Character of Wound/Ulcer Post Debridement is stable. Severity of Tissue Post Debridement is: Limited to breakdown of skin. Post procedure Diagnosis Wound #8: Same as Pre-Procedure Plan Wound Cleansing: Wound #8 Left,Anterior Lower Leg: Clean wound with Normal Saline. May Shower, gently pat wound dry prior to applying new dressing. Anesthetic: Wound #8 Left,Anterior Lower Leg: Topical Lidocaine  4% cream applied to wound bed prior to debridement Skin Barriers/Peri-Wound Care: Wound #8 Left,Anterior Lower Leg: Skin Prep Primary Wound Dressing: Wound #8 Left,Anterior Lower Leg: Aquacel Ag - when next dressing change is due Secondary Dressing: Wound #8 Left,Anterior Lower Leg: Boardered Foam Dressing Dressing Change Frequency: Wound #8 Left,Anterior Lower Leg: Change dressing every day. Follow-up Appointments: Wound #8 Left,Anterior Lower Leg: Return Appointment in 1 week. Edema Control: Wound #8 Left,Anterior Lower Leg: Elevate legs to the level of the heart and pump ankles as often as possible #1 diabetic ulcer in a very difficult spot in the anterior/dorsal aspect of the left ankle. This largely also probably has a pressure component, whether or not this had something to do with the stocking he purchased 2 or 3 days before is unclear. I've asked him to bring in the stocking next week so we can have a look at it. I wonder if this is a pressure area in his custom orthotic boot as well although I think I sent him back to [facility] for this issue during his last admission in this clinic. BRYDON, SPAHR ([id number]) There are a lot of issues here including diabetic neuropathy with Charcot deformity, chronic inversion at the ankle, chronic venous insufficiency, likely some degree of chronic lymphedema and finally pressure in his custom orthotic boot plus or minus irritation from the stocking he purchased. I don't expect much problem getting this to heal however keeping this healed may be a challenge Electronic Signature(s) Signed: 08/15/2016 7:31:48 AM By: Baltazar Najjar MD Entered By: Baltazar Najjar on 08/15/2016 07:27:55 [name] [name] ([id number]) -------------------------------------------------------------------------------- SuperBill Details Patient Name: [name] [name] Date of Service: 08/14/2016 Medical Record Patient Account Number: [id number] Number: Treating RN:  Clover Mealy, RN, BSN, Rita April 21, 1956 [phone] y.o. Other Clinician: Date of Birth/Sex: Male) Treating Azarah Dacy Primary Care Physician: [name], [name] Physician/Extender: G Referring Physician: FITZGERALD, [name] Weeks in Treatment: 8 Diagnosis Coding ICD-10 Codes Code Description E11.42 Type 2 diabetes mellitus with diabetic polyneuropathy M14.672 Charcot's joint, left ankle and foot L97.322 Non-pressure chronic ulcer of left ankle  with fat layer exposed Facility Procedures CPT4 Code: [id number] Description: 11042 - DEB SUBQ TISSUE 20 SQ CM/< ICD-10 Description Diagnosis L97.322 Non-pressure chronic ulcer of left ankle with fat Modifier: layer exposed Quantity: 1 Physician Procedures CPT4 Code: [id number] Description: 11042 - WC PHYS SUBQ TISS 20 SQ CM ICD-10 Description Diagnosis 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

## 2016-08-21 ENCOUNTER — Encounter: Payer: Medicare Other | Admitting: Internal Medicine

## 2016-08-21 DIAGNOSIS — L97322 Non-pressure chronic ulcer of left ankle with fat layer exposed: Secondary | ICD-10-CM | POA: Diagnosis not present

## 2016-08-23 NOTE — Progress Notes (Signed)
[name] [name], Atif ([id number]) Visit Report for 08/21/2016 Arrival Information Details Patient Name: [name] [name], [name] [name] Date of Service: 08/21/2016 1:30 PM Medical Record Number: [id number] Patient Account Number: [id number] Date of Birth/Sex: 02/24/1956 [age] y.o. Male) Treating RN: Clover MealyAfful, RN, BSN, [facility] Primary Care Physician: FITZGERALD, [name] Other Clinician: Referring Physician: FITZGERALD, [name] Treating Physician/Extender: Altamese CarolinaOBSON, MICHAEL G Weeks in Treatment: 9 Visit Information History Since Last Visit All ordered tests and consults were completed: No Patient Arrived: Wheel Chair Added or deleted any medications: No Arrival Time: 13:31 Any new allergies or adverse reactions: No Accompanied By: self Had a fall or experienced change in No activities of daily living that may affect Transfer Assistance: None risk of falls: Patient Identification Verified: Yes Signs or symptoms of abuse/neglect since last No Secondary Verification Process Yes visito Completed: Hospitalized since last visit: No Patient Requires Transmission-Based No Has Dressing in Place as Prescribed: Yes Precautions: Pain Present Now: No Patient Has Alerts: No Electronic Signature(s) Signed: 08/21/2016 3:26:52 PM By: Elpidio EricAfful, Rita BSN, RN Entered By: Elpidio EricAfful, Rita on 08/21/2016 13:32:03 [name] [name], Sajan ([id number]) -------------------------------------------------------------------------------- Encounter Discharge Information Details Patient Name: [name] [name], [name] [name] Date of Service: 08/21/2016 1:30 PM Medical Record Number: [id number] Patient Account Number: [id number] Date of Birth/Sex: 04/13/1956 [age] y.o. Male) Treating RN: Clover MealyAfful, RN, BSN, Rita Primary Care Physician: FITZGERALD, [name] Other Clinician: Referring Physician: FITZGERALD, [name] Treating Physician/Extender: Altamese CarolinaOBSON, MICHAEL G Weeks in Treatment: 9 Encounter Discharge Information Items Discharge Pain Level: 0 Discharge Condition: Stable Ambulatory Status:  Wheelchair Discharge Destination: Home Transportation: Other Accompanied By: self Schedule Follow-up Appointment: No Medication Reconciliation completed and provided to Patient/Care No Kateline Kinkade: Provided on Clinical Summary of Care: 08/21/2016 Form Type Recipient Paper Patient ta Electronic Signature(s) Signed: 08/21/2016 3:19:16 PM By: Elpidio EricAfful, Rita BSN, RN Previous Signature: 08/21/2016 3:19:09 PM Version By: Elpidio EricAfful, Rita BSN, RN Previous Signature: 08/21/2016 2:16:23 PM Version By: Dayton MartesWallace, RCP,RRT,CHT, Sallie RCP, RRT, CHT Entered By: Elpidio EricAfful, Rita on 08/21/2016 15:19:16 [name] [name], [place] ([id number]) -------------------------------------------------------------------------------- Lower Extremity Assessment Details Patient Name: [name] [name], Kanai Date of Service: 08/21/2016 1:30 PM Medical Record Number: [id number] Patient Account Number: [id number] Date of Birth/Sex: 09/01/1956 [age] y.o. Male) Treating RN: Clover MealyAfful, RN, BSN, Rita Primary Care Physician: FITZGERALD, [name] Other Clinician: Referring Physician: FITZGERALD, [name] Treating Physician/Extender: Maxwell CaulOBSON, MICHAEL G Weeks in Treatment: 9 Edema Assessment Assessed: [Left: No] [Right: No] E[Left: dema] [Right: :] Calf Left: Right: Point of Measurement: 36 cm From Medial Instep 38.9 cm cm Ankle Left: Right: Point of Measurement: 14 cm From Medial Instep 31.7 cm cm Vascular Assessment Claudication: Claudication Assessment [Left:None] Pulses: Posterior Tibial Dorsalis Pedis Palpable: [Left:Yes] Extremity colors, hair growth, and conditions: Extremity Color: [Left:Dusky] Hair Growth on Extremity: [Left:No] Temperature of Extremity: [Left:Warm] Capillary Refill: [Left:< 3 seconds] Toe Nail Assessment Left: Right: Thick: Yes Discolored: Yes Deformed: Yes Improper Length and Hygiene: Yes Electronic Signature(s) Signed: 08/21/2016 3:26:52 PM By: Elpidio EricAfful, Rita BSN, RN Entered By: Elpidio EricAfful, Rita on 08/21/2016 13:43:41 [name] [name], [name] [name]  ([id number]) [name] [name], [name] [name] ([id number]) -------------------------------------------------------------------------------- Multi Wound Chart Details Patient Name: [name] [name], [name] [name] Date of Service: 08/21/2016 1:30 PM Medical Record Number: [id number] Patient Account Number: [id number] Date of Birth/Sex: 08/30/1956 [age] y.o. 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] Comorbid History: [8:Cataracts, Anemia, Arrhythmia, Congestive Heart Failure, Coronary Artery Disease, Hypertension, Myocardial Infarction, Type II Diabetes, End Stage Renal Disease, Osteoarthritis, Neuropathy] [N/A:N/A] Date Acquired: [8:07/24/2016] [N/A:N/A] Weeks of Treatment: [8:3] [N/A:N/A] Wound Status: [8:Open] [N/A:N/A] Measurements L x W x D 0.4x1.5x0.2 [N/A:N/A] (cm) Area (cm) : [8:0.471] [N/A:N/A] Volume (cm) : [8:0.094] [N/A:N/A] % Reduction in Area: [8:82.40%] [N/A:N/A] % Reduction in Volume: 82.40% [N/A:N/A] Classification: [8:Grade 1] [N/A:N/A] Exudate Amount: [8:Large] [N/A:N/A] Exudate Type: [8:Serosanguineous] [N/A:N/A] Exudate Color: [8:red, brown] [N/A:N/A] Wound Margin: [8:Distinct, outline attached N/A] Granulation Amount: [8:Large (67-100%)] [N/A:N/A] Granulation Quality: [8:Pink, Pale] [N/A:N/A] Necrotic Amount: Small (1-33%) N/A N/A Exposed Structures: Fascia: No N/A N/A Fat: No Tendon: No Muscle: No Joint: No Bone: No Limited to Skin Breakdown Epithelialization: Small  (1-33%) N/A N/A Periwound Skin Texture: Edema: Yes N/A N/A Excoriation: No Induration: No Callus: No Crepitus: No Fluctuance: No Friable: No Rash: No Scarring: No Periwound Skin Moist: Yes N/A N/A Moisture: Maceration: No Dry/Scaly: No Periwound Skin Color: Atrophie Blanche: No N/A N/A Cyanosis: No Ecchymosis: No Erythema: No Hemosiderin Staining: No Mottled: No Pallor: No Rubor: No Temperature: No Abnormality N/A N/A Tenderness on Yes N/A N/A Palpation: Wound Preparation: Ulcer Cleansing: N/A N/A Rinsed/Irrigated with Saline, Other: surg scrub and water Topical Anesthetic Applied: Other: Lidocaine 4% Treatment Notes Electronic Signature(s) Signed: 08/21/2016 3:26:52 PM By: Elpidio Eric BSN, RN Entered By: Elpidio Eric on 08/21/2016 13:48:29 [name] [name] ([id number]) -------------------------------------------------------------------------------- Multi-Disciplinary Care Plan Details Patient Name: [name] [name] Date of Service: 08/21/2016 1:30 PM Medical Record Number: [id number] Patient Account Number: [id number] Date of Birth/Sex: 08-15-1956 (60 y.o. Male) Treating RN: Clover Mealy, RN, BSN, Rita Primary Care Physician: FITZGERALD, [name] Other Clinician: Referring Physician: FITZGERALD, [name] Treating Physician/Extender: Altamese [place] in Treatment: 9 Active Inactive Electronic Signature(s) Signed: 08/21/2016 3:26:52 PM By: Elpidio Eric BSN, RN Entered By: Elpidio Eric on 08/21/2016 13:48:01 [name] [name] ([id number]) -------------------------------------------------------------------------------- Pain Assessment Details Patient Name: [name] [name] Date of Service: 08/21/2016 1:30 PM Medical Record Number: [id number] Patient Account Number: [id number] Date of Birth/Sex: 01/07/1956 (60 y.o. Male) Treating RN: Clover Mealy, RN, BSN, Rita Primary Care Physician: FITZGERALD, [name] Other Clinician: Referring Physician: FITZGERALD, [name] Treating Physician/Extender: Altamese [place] in Treatment: 9 Active Problems Location of Pain Severity and Description of Pain Patient Has Paino No Site Locations With Dressing Change: No Pain Management and Medication Current Pain Management: Electronic Signature(s) Signed: 08/21/2016 3:26:52 PM By: Elpidio Eric BSN, RN Entered By: Elpidio Eric on 08/21/2016 13:32:12 [name] [name] ([id number]) -------------------------------------------------------------------------------- Patient/Caregiver Education Details Patient Name: [name] [name] Date of Service: 08/21/2016 1:30 PM Medical Record Number: [id number] Patient Account Number: [id number] Date of Birth/Gender: 01-Sep-1956 (60 y.o. Male) Treating RN: Clover Mealy, RN, BSN, Rita Primary Care Physician: FITZGERALD, [name] Other Clinician: Referring Physician: FITZGERALD, [name] Treating Physician/Extender: Altamese [place] in Treatment: 9 Education Assessment Education Provided To: Patient Education Topics Provided Basic Hygiene: Methods: Explain/Verbal Responses: State content correctly Wound/Skin Impairment: Methods: Explain/Verbal Responses: State content correctly Electronic Signature(s) Signed: 08/21/2016 3:26:52 PM By: Elpidio Eric BSN, RN Entered By: Elpidio Eric on 08/21/2016 15:19:31 [name] [name] ([id number]) -------------------------------------------------------------------------------- Wound Assessment Details Patient Name: [name] [name] Date of Service: 08/21/2016 1:30 PM Medical Record Number: [id number] Patient Account Number: [id number] Date of Birth/Sex: 1956-04-05 (60 y.o. Male) Treating RN: Clover Mealy, RN, BSN, Rita Primary Care Physician: FITZGERALD, [name] Other Clinician: Referring Physician: FITZGERALD, [name] Treating Physician/Extender: Maxwell Caul Weeks in Treatment: 9 Wound Status Wound Number: 8 Primary Diabetic Wound/Ulcer of the  Lower Etiology: Extremity Wound Location: Left Lower Leg - Anterior Wound Open Wounding Event: Gradually  Appeared Status: Date Acquired: 07/24/2016 Comorbid Cataracts, Anemia, Arrhythmia, Weeks Of Treatment: 3 History: Congestive Heart Failure, Coronary Clustered Wound: No Artery Disease, Hypertension, Myocardial Infarction, Type II Diabetes, End Stage Renal Disease, Osteoarthritis, Neuropathy Photos Photo Uploaded By: Elpidio Eric on 08/21/2016 15:21:22 Wound Measurements Length: (cm) 0.4 Width: (cm) 1.5 Depth: (cm) 0.2 Area: (cm) 0.471 Volume: (cm) 0.094 % Reduction in Area: 82.4% % Reduction in Volume: 82.4% Epithelialization: Small (1-33%) Tunneling: No Undermining: No Wound Description Classification: Grade 1 Wound Margin: Distinct, outline attached Exudate Amount: Large Exudate Type: Serosanguineous Exudate Color: red, brown Foul Odor After Cleansing: No Wound Bed Granulation Amount: Large (67-100%) Exposed Structure Kuznicki, Ahmaad ([id number]) Granulation Quality: Pink, Pale Fascia Exposed: No Necrotic Amount: Small (1-33%) Fat Layer Exposed: No Necrotic Quality: Adherent Slough Tendon Exposed: No Muscle Exposed: No Joint Exposed: No Bone Exposed: No Limited to Skin Breakdown Periwound Skin Texture Texture Color No Abnormalities Noted: No No Abnormalities Noted: No Callus: No Atrophie Blanche: No Crepitus: No Cyanosis: No Excoriation: No Ecchymosis: No Fluctuance: No Erythema: No Friable: No Hemosiderin Staining: No Induration: No Mottled: No Localized Edema: Yes Pallor: No Rash: No Rubor: No Scarring: No Temperature / Pain Moisture Temperature: No Abnormality No Abnormalities Noted: No Tenderness on Palpation: Yes Dry / Scaly: No Maceration: No Moist: Yes Wound Preparation Ulcer Cleansing: Rinsed/Irrigated with Saline, Other: surg scrub and water, Topical Anesthetic Applied: Other: Lidocaine 4%, Treatment Notes Wound #8 (Left, Anterior Lower Leg) 1. Cleansed with: Cleanse wound with antibacterial soap and water 3. Peri-wound  Care: Barrier cream 4. Dressing Applied: Aquacel Ag 5. Secondary Dressing Applied ABD Pad 7. Secured with 2 Layer Lite Compression System - Left Lower Extremity Electronic Signature(s) Signed: 08/21/2016 3:26:52 PM By: Elpidio Eric BSN, RN Entered By: Elpidio Eric on 08/21/2016 13:44:29 RISHON, THILGES ([id number]) [name] [name] ([id number]) -------------------------------------------------------------------------------- Vitals Details Patient Name: [name] [name] Date of Service: 08/21/2016 1:30 PM Medical Record Number: [id number] Patient Account Number: [id number] Date of Birth/Sex: June 27, 1956 (60 y.o. Male) Treating RN: Clover Mealy, RN, BSN, Rita Primary Care Physician: FITZGERALD, [name] Other Clinician: Referring Physician: FITZGERALD, [name] Treating Physician/Extender: Maxwell Caul Weeks in Treatment: 9 Vital Signs Time Taken: 13:35 Temperature (F): 98 Height (in): 72 Pulse (bpm): 86 Respiratory Rate (breaths/min): 18 Blood Pressure (mmHg): 109/69 Reference Range: 80 - 120 mg / dl Electronic Signature(s) Signed: 08/21/2016 3:26:52 PM By: Elpidio Eric BSN, RN Entered By: Elpidio Eric on 08/21/2016 13:35:53

## 2016-08-23 NOTE — Progress Notes (Signed)
[name] [name], [name] [name] ([id number]) Visit Report for 08/21/2016 Chief Complaint Document Details Patient Name: [name] [name], [name] [name] Date of Service: 08/21/2016 1:30 PM Medical Record Patient Account Number: [id number] Number: Treating RN: [name] MealyAfful, RN, BSN, [name] 11/01/1957 [phone](60 y.o. Other Clinician: Date of Birth/Sex: Male) Treating [name] [name] Primary Care Physician: [name] [name], [name] Physician/Extender: G Referring Physician: FITZGERALD, [name] Weeks in Treatment: 9 Information Obtained from: Patient Chief Complaint Right second toe wound and left lateral malleolar wound. is doing fine and has no fresh issues. He does have his appointment with the vascular surgeons. However he does not have an appointment with the orthopedic surgeons yet. he did not come for 2 weeks because of the bad weather. He has no fresh issues. 04/10/16; the patient returns today with a wound over the last 2-3 weeks on the anterior part of his left ankle. 07/31/2016 -- the patient was recently discharged 3 weeks ago and comes back with a reopened wound the left anterior ankle has been there for about 2 weeks Electronic Signature(s) Signed: 08/22/2016 4:32:31 PM By: [name] [name], Michael MD Entered By: [name] [name], [name] on 08/21/2016 14:10:36 [name] [name], [name] [name] ([id number]) -------------------------------------------------------------------------------- Debridement Details Patient Name: [name] [name], [name] [name] Date of Service: 08/21/2016 1:30 PM Medical Record Patient Account Number: [id number] Number: Treating RN: [name] MealyAfful, RN, BSN, [name] 01/02/1957 [id number](60 y.o. Other Clinician: Date of Birth/Sex: Male) Treating [name] [name] Primary Care Physician: [name] [name], [name] Physician/Extender: G Referring Physician: FITZGERALD, [name] Weeks in Treatment: 9 Debridement Performed for Wound #8 Left,Anterior Lower Leg Assessment: Performed By: Physician [name] [name], [name] G, MD Debridement: Debridement Pre-procedure Yes - 13:47 Verification/Time  Out Taken: Start Time: 13:47 Pain Control: Lidocaine 4% Topical Solution Level: Skin/Subcutaneous Tissue Total Area Debrided (L x 0.4 (cm) x 1.5 (cm) = 0.6 (cm) W): Tissue and other Non-Viable, Fibrin/Slough, Subcutaneous material debrided: Instrument: Curette Bleeding: Minimum Hemostasis Achieved: Pressure End Time: 13:51 Procedural Pain: 0 Post Procedural Pain: 0 Response to Treatment: Procedure was tolerated well Post Debridement Measurements of Total Wound Length: (cm) 0.4 Width: (cm) 1.5 Depth: (cm) 0.2 Volume: (cm) 0.094 Character of Wound/Ulcer Post Improved Debridement: Severity of Tissue Post Debridement: Limited to breakdown of skin Post Procedure Diagnosis Same as Pre-procedure Electronic Signature(s) Signed: 08/21/2016 3:26:52 PM By: [name] [name], [name] BSN, RN Signed: 08/22/2016 4:32:31 PM By: [name] [name], Michael MD [name], [name] LoftERRY ([id number]) Entered By: [name] [name], [name] on 08/21/2016 14:10:06 [name] [name], [name] [name] ([id number]) -------------------------------------------------------------------------------- HPI Details Patient Name: [name] [name], [name] [name] Date of Service: 08/21/2016 1:30 PM Medical Record Patient Account Number: [id number] Number: Treating RN: [name] MealyAfful, RN, BSN, [name] 08/12/1957 (60 y.o. Other Clinician: Date of Birth/Sex: Male) Treating [name] [name] Primary Care Physician: [name] [name], [name] Physician/Extender: G Referring Physician: FITZGERALD, [name] Weeks in Treatment: 9 History of Present Illness Location: left fourth toe Quality: Patient reports experiencing a dull pain to affected area(s). Severity: Patient states wound (s) are getting better. Duration: Patient states that they are not certain how long the wound has been present. Timing: Pain in wound is constant (hurts all the time) Context: The wound appeared gradually over time Associated Signs and Symptoms: nil HPI Description: has been doing fine and has no fresh issues. He does say that he is  trying to get all his appointments in order and at present he has a appointment with the vascular surgeons. While at home he removes his walking shoe and keeps the leg and foot open. There is no pain and minimal drainage. 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 He tells me that over the last 2-3 weeks he has noticed pain and a wound on the crease of his left dorsal ankle and he is here for our review events. Because of the deformity in his ankle he has a fracture boot that he uses the walker. This was made at [employer] in [facility]. [facility] he was in hospital in January and suffered a wide complex cardiac arrest at the time of the fistulagram. He has recovered from this. 04/17/16 a his left dorsal ankle appears to be improved. He has an appointment with [employer] to adjust his boot next week. He does not appear to have significant PAD but does have significant venous insufficiencywent 04/24/16; went to [employer] last week,did not adjust his boot due to edema. She has called his cardiologist and in the meantime they have apparently taken some extra fluid off him at dialysis. He dialyzes Monday Wednesday and Friday 05/01/16; the patient arrived with some scant surface eschar over his dorsal ankle wound. This was debridement. There is nothing but normal skin under this. His leg seems to have done well with a 2 layer compression we put on this last week accompanied by extra fluid removal at dialysis's resulted in less edema of his leg. He has chronic venous insufficiency with resultant edema been eating his chronic renal failure and deformity of his left ankle. 06/13/16; the patient arrives today with a  recurrent wound in the same spot as last time. He has chronic venous insufficiency also deformity of the foot and ankle likely secondary to Charcot deformity. The patient has type 2 diabetes with polyneuropathy. He states the stockings he was wearing were too tight and he feels that's why it rubbed his ankle. He also has a Systems developer, that was adjusted last time. EUCLIDE, GRANITO ([id number]) 06/19/16; Arrives today with a new wound on the right anterior leg. The original wound on the crease of his dorsal left ankle in the setting of chronic venous insufficiency and a Charcot deformity in this area is roughly the same. 06/27/16; the area on the right anterior leg is just about closed over, only 2 small areas remaining. The original wound in the crease of the dorsal left ankle in the setting of chronic venous insufficiency and a Charcot deformity appears improved. The debridement of adherent surface slough and nonviable subcutaneous tissue post debridement everything looks quite healthy here. 07/03/16: right lower leg wound healed. left lower leg wound improved. no reports of systemic s/s of infection. 07/10/16; right lower leg wound is healed. The area on his left anterior ankle covered in and eschar. 07/31/2016 -- the patient was recently discharged in the middle of July returns in about 3 weeks with a fresh wound which has reopened. He is a diabetic with chronic venous insufficiency and also a Charcot foot. he says he started wearing compression stockings which have caused him an abrasion on the deformed ankle. Not find any evidence of venous insufficiency in the past. Review of his vascular studies done in June 2016 showed a right ABI of 1.15 and a left ABI of 1.11 08/14/16; readmission noted; wound debrided. This does not look unhealthy. The patient states that the wound happened 2 days after he purchased some form of compression stocking, perhaps rolled up  and irritated this area although this  simply may be an area of compression in his custom boot 08/21/16; wound on the dorsal left ankle. In the setting of a inversion deformity, Charcot foot. Wound bed continues to look improved Electronic Signature(s) Signed: 08/22/2016 4:32:31 PM By: [name] Najjar MD Entered By: [name] Najjar on 08/21/2016 14:11:27 [name] [name] ([id number]) -------------------------------------------------------------------------------- Physical Exam Details Patient Name: [name] [name] Date of Service: 08/21/2016 1:30 PM Medical Record Patient Account Number: [id number] Number: Treating RN: [name] Mealy, RN, BSN, [name] 01-30-1956 [phone] y.o. Other Clinician: Date of Birth/Sex: Male) Treating [name] [name] Primary Care Physician: [name] Goon, [name] Physician/Extender: G Referring Physician: FITZGERALD, [name] Weeks in Treatment: 9 Notes Wound exam; edema is well controlled likely a setting of venous insufficiency/inflammation as well as lymphedema. There is a Charcot foot chronic inversion of the left ankle. The wound is in the anterior crease of the left ankle. Debridement of surrounding skin surface slough. Electronic Signature(s) Signed: 08/22/2016 4:32:31 PM By: [name] Najjar MD Entered By: [name] Najjar on 08/21/2016 14:12:18 [name] [name] ([id number]) -------------------------------------------------------------------------------- Physician Orders Details Patient Name: [name] [name] Date of Service: 08/21/2016 1:30 PM Medical Record Patient Account Number: [id number] Number: Treating RN: [name] Mealy, RN, BSN, [name] May 27, 1956 [phone] y.o. Other Clinician: Date of Birth/Sex: Male) Treating [name] [name] Primary Care Physician: [name] Goon, [name] Physician/Extender: G Referring Physician: FITZGERALD, [name] Weeks in Treatment: 9 Verbal / Phone Orders: Yes Clinician: Afful, RN, BSN, [name] Read Back and Verified: Yes Diagnosis Coding Wound Cleansing Wound #8 Left,Anterior Lower Leg o Clean  wound with Normal Saline. o Cleanse wound with mild soap and water o May Shower, gently pat wound dry prior to applying new dressing. Anesthetic Wound #8 Left,Anterior Lower Leg o Topical Lidocaine 4% cream applied to wound bed prior to debridement Primary Wound Dressing Wound #8 Left,Anterior Lower Leg o Aquacel Ag - when next dressing change is due Dressing Change Frequency Wound #8 Left,Anterior Lower Leg o Change dressing every day. Follow-up Appointments Wound #8 Left,Anterior Lower Leg o Return Appointment in 1 week. Edema Control Wound #8 Left,Anterior Lower Leg o 2 Layer Lite Compression System - Left Lower Extremity o Elevate legs to the level of the heart and pump ankles as often as possible Electronic Signature(s) Signed: 08/21/2016 3:26:52 PM By: [name] Eric BSN, RN Signed: 08/22/2016 4:32:31 PM By: [name] Najjar MD Entered By: [name] Eric on 08/21/2016 13:52:25 [name] [name] ([id number]) [name] [name] ([id number]) -------------------------------------------------------------------------------- Problem List Details Patient Name: [name] [name] Date of Service: 08/21/2016 1:30 PM Medical Record Patient Account Number: [id number] Number: Treating RN: [name] Mealy, RN, BSN, [name] January 26, 1956 [phone] y.o. 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 07/31/2016 Yes exposed Inactive Problems Resolved Problems ICD-10 Code Description Active Date Resolved Date L97.321 Non-pressure chronic ulcer of left ankle limited to 06/13/2016 06/13/2016 breakdown  of skin L97.211 Non-pressure chronic ulcer of right calf limited to 06/19/2016 06/19/2016 breakdown of skin Electronic Signature(s) Signed: 08/22/2016 4:32:31 PM By: [name] Najjar MD Entered By: [name] Najjar on 08/21/2016 14:09:33 [name] [name] ([id number]) -------------------------------------------------------------------------------- Progress Note Details Patient Name: [name] [name] Date of Service: 08/21/2016 1:30 PM Medical Record Patient Account Number: [id number] Number: Treating RN: [name] Mealy, RN, BSN, [name] 1956-11-11 (60 y.o.  Other Clinician: Date of Birth/Sex: Male) Treating [name] [name] Primary Care Physician: [name] Goon, [name] Physician/Extender: G Referring Physician: FITZGERALD, [name] Weeks in Treatment: 9 Subjective Chief Complaint Information obtained from Patient Right second toe wound and left lateral malleolar wound. is doing fine and has no fresh issues. He does have his appointment with the vascular surgeons. However he does not have an appointment with the orthopedic surgeons yet. he did not come for 2 weeks because of the bad weather. He has no fresh issues. 04/10/16; the patient returns today with a wound over the last 2-3 weeks on the anterior part of his left ankle. 07/31/2016 -- the patient was recently discharged 3 weeks ago and comes back with a reopened wound the left anterior ankle has been there for about 2 weeks History of Present Illness (HPI) The following HPI elements were documented for the patient's wound: Location: left fourth toe Quality: Patient reports experiencing a dull pain to affected area(s). Severity: Patient states wound (s) are getting better. Duration: Patient states that they are not certain how long the wound has been present. Timing: Pain in wound is constant (hurts all the time) Context: The wound appeared gradually over time Associated Signs and Symptoms: nil has been doing fine and has no fresh issues. He does say  that he is trying to get all his appointments in order and at present he has a appointment with the vascular surgeons. While at home he removes his walking shoe and keeps the leg and foot open. There is no pain and minimal drainage. 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 He tells me that over the last 2-3 weeks he has noticed pain and a wound on the crease of his left dorsal ankle and he is here for our review events. Because of the deformity in his ankle he has a fracture boot that he uses the walker. This was made at [employer] in [facility]. [facility] he was in hospital in January and suffered a wide complex cardiac arrest at the time of the fistulagram. He has recovered from this. 04/17/16 a his left dorsal ankle appears to be improved. He has an appointment with [employer]otech to adjust his boot next week. He does not appear to have significant PAD but does have significant venous insufficiencywent 04/24/16; went to biotech last week,did not adjust his boot due to edema. She has called his cardiologist and [name] [name], [name] [name] ([id number]) in the meantime they have apparently taken some extra fluid off him at dialysis. He dialyzes Monday Wednesday and Friday 05/01/16; the patient arrived with some scant surface eschar over his dorsal ankle wound. This was debridement. There is nothing but normal skin under this. His leg seems to have done well with a 2 layer compression we put on this last week accompanied by extra fluid removal at dialysis's resulted in less edema of his leg. He has chronic venous insufficiency with resultant edema been eating his chronic renal failure and deformity of his left ankle. 06/13/16;  the patient arrives today with a recurrent wound in the same spot as last time. He has chronic venous insufficiency also deformity of the foot and ankle likely secondary to Charcot deformity. The  patient has type 2 diabetes with polyneuropathy. He states the stockings he was wearing were too tight and he feels that's why it rubbed his ankle. He also has a Systems developer, that was adjusted last time. 06/19/16; Arrives today with a new wound on the right anterior leg. The original wound on the crease of his dorsal left ankle in the setting of chronic venous insufficiency and a Charcot deformity in this area is roughly the same. 06/27/16; the area on the right anterior leg is just about closed over, only 2 small areas remaining. The original wound in the crease of the dorsal left ankle in the setting of chronic venous insufficiency and a Charcot deformity appears improved. The debridement of adherent surface slough and nonviable subcutaneous tissue post debridement everything looks quite healthy here. 07/03/16: right lower leg wound healed. left lower leg wound improved. no reports of systemic s/s of infection. 07/10/16; right lower leg wound is healed. The area on his left anterior ankle covered in and eschar. 07/31/2016 -- the patient was recently discharged in the middle of July returns in about 3 weeks with a fresh wound which has reopened. He is a diabetic with chronic venous insufficiency and also a Charcot foot. he says he started wearing compression stockings which have caused him an abrasion on the deformed ankle. Not find any evidence of venous insufficiency in the past. Review of his vascular studies done in June 2016 showed a right ABI of 1.15 and a left ABI of 1.11 08/14/16; readmission noted; wound debrided. This does not look unhealthy. The patient states that the wound happened 2 days after he purchased some form of compression stocking, perhaps rolled up and irritated this area although  this simply may be an area of compression in his custom boot 08/21/16; wound on the dorsal left ankle. In the setting of a inversion deformity, Charcot foot. Wound bed continues to look improved Objective Constitutional Vitals Time Taken: 1:35 PM, Height: 72 in, Temperature: 98 F, Pulse: 86 bpm, Respiratory Rate: 18 breaths/min, Blood Pressure: 109/69 mmHg. Integumentary (Hair, Skin) Wound #8 status is Open. Original cause of wound was Gradually Appeared. The wound is located on the Left,Anterior Lower Leg. The wound measures 0.4cm length x 1.5cm width x 0.2cm depth; 0.471cm^2 area [name] [name], [name] [name] ([id number]) and 0.094cm^3 volume. The wound is limited to skin breakdown. There is no tunneling or undermining noted. There is a large amount of serosanguineous drainage noted. The wound margin is distinct with the outline attached to the wound base. There is large (67-100%) pink, pale granulation within the wound bed. There is a small (1-33%) amount of necrotic tissue within the wound bed including Adherent Slough. The periwound skin appearance exhibited: Localized Edema, Moist. The periwound skin appearance did not exhibit: Callus, Crepitus, Excoriation, Fluctuance, Friable, Induration, Rash, Scarring, Dry/Scaly, Maceration, Atrophie Blanche, Cyanosis, Ecchymosis, Hemosiderin Staining, Mottled, Pallor, Rubor, Erythema. Periwound temperature was noted as No Abnormality. The periwound has tenderness on palpation. Assessment Active Problems ICD-10 E11.42 - Type 2 diabetes mellitus with diabetic polyneuropathy M14.672 - Charcot's joint, left ankle and foot L97.322 - Non-pressure chronic ulcer of left ankle with fat layer exposed Procedures Wound #8 Wound #8 is a Diabetic Wound/Ulcer of the Lower Extremity located on the Left,Anterior Lower Leg . There was a Skin/Subcutaneous Tissue Debridement ([id number]) debridement with total area of 0.6 sq cm performed by [name] Caul, MD. with the  following instrument(s): Curette to remove Non-Viable tissue/material including Fibrin/Slough and Subcutaneous after achieving pain control using Lidocaine  4% Topical Solution. A time out was conducted at 13:47, prior to the start of the procedure. A Minimum amount of bleeding was controlled with Pressure. The procedure was tolerated well with a pain level of 0 throughout and a pain level of 0 following the procedure. Post Debridement Measurements: 0.4cm length x 1.5cm width x 0.2cm depth; 0.094cm^3 volume. Character of Wound/Ulcer Post Debridement is improved. Severity of Tissue Post Debridement is: Limited to breakdown of skin. Post procedure Diagnosis Wound #8: Same as Pre-Procedure Plan [name] [name], [name] [name] ([id number]) Wound Cleansing: Wound #8 Left,Anterior Lower Leg: Clean wound with Normal Saline. Cleanse wound with mild soap and water May Shower, gently pat wound dry prior to applying new dressing. Anesthetic: Wound #8 Left,Anterior Lower Leg: Topical Lidocaine 4% cream applied to wound bed prior to debridement Primary Wound Dressing: Wound #8 Left,Anterior Lower Leg: Aquacel Ag - when next dressing change is due Dressing Change Frequency: Wound #8 Left,Anterior Lower Leg: Change dressing every day. Follow-up Appointments: Wound #8 Left,Anterior Lower Leg: Return Appointment in 1 week. Edema Control: Wound #8 Left,Anterior Lower Leg: 2 Layer Lite Compression System - Left Lower Extremity Elevate legs to the level of the heart and pump ankles as often as possible wound looks as though ti is progressing towards healing no change in dressing, kerlix and coban ofoam cover to protect this when this is closed Electronic Signature(s) Signed: 08/22/2016 4:32:31 PM By: [name] Najjar MD Entered By: [name] Najjar on 08/21/2016 14:13:37 [name] [name] ([id number]) -------------------------------------------------------------------------------- SuperBill Details Patient Name: [name] [name] Date of Service: 08/21/2016 Medical Record Patient Account Number: [id number] Number: Treating RN: [name] Mealy, RN, BSN, [name] 18-Jul-1956 [phone] y.o. Other Clinician: Date of Birth/Sex: Male) Treating [name] [name] Primary Care Physician: [name] Goon, [name] Physician/Extender: G Referring Physician: FITZGERALD, [name] Weeks in Treatment: 9 Diagnosis Coding ICD-10 Codes Code Description E11.42 Type 2 diabetes mellitus with diabetic polyneuropathy M14.672 Charcot's joint, left ankle and foot L97.322 Non-pressure chronic ulcer of left ankle with fat layer exposed Facility Procedures CPT4 Code: [id number] Description: 11042 - DEB SUBQ TISSUE 20 SQ CM/< ICD-10 Description Diagnosis M14.672 Charcot's joint, left ankle and foot Modifier: Quantity: 1 Physician Procedures CPT4 Code: [id number] Description: 11042 - WC PHYS SUBQ TISS 20 SQ CM ICD-10 Description Diagnosis M14.672 Charcot's joint, left ankle and foot Modifier: Quantity: 1 Electronic Signature(s) Signed: 08/22/2016 4:32:31 PM By: [name] Najjar MD Entered By: [name] Najjar on 08/21/2016 14:14:11

## 2016-08-28 ENCOUNTER — Encounter: Payer: Medicare Other | Attending: Internal Medicine | Admitting: Internal Medicine

## 2016-08-28 DIAGNOSIS — D631 Anemia in chronic kidney disease: Secondary | ICD-10-CM | POA: Insufficient documentation

## 2016-08-28 DIAGNOSIS — I252 Old myocardial infarction: Secondary | ICD-10-CM | POA: Diagnosis not present

## 2016-08-28 DIAGNOSIS — I132 Hypertensive heart and chronic kidney disease with heart failure and with stage 5 chronic kidney disease, or end stage renal disease: Secondary | ICD-10-CM | POA: Insufficient documentation

## 2016-08-28 DIAGNOSIS — E1142 Type 2 diabetes mellitus with diabetic polyneuropathy: Secondary | ICD-10-CM | POA: Insufficient documentation

## 2016-08-28 DIAGNOSIS — I509 Heart failure, unspecified: Secondary | ICD-10-CM | POA: Diagnosis not present

## 2016-08-28 DIAGNOSIS — E1122 Type 2 diabetes mellitus with diabetic chronic kidney disease: Secondary | ICD-10-CM | POA: Insufficient documentation

## 2016-08-28 DIAGNOSIS — Z992 Dependence on renal dialysis: Secondary | ICD-10-CM | POA: Insufficient documentation

## 2016-08-28 DIAGNOSIS — M14672 Charcot's joint, left ankle and foot: Secondary | ICD-10-CM | POA: Diagnosis not present

## 2016-08-28 DIAGNOSIS — N186 End stage renal disease: Secondary | ICD-10-CM | POA: Diagnosis not present

## 2016-08-28 DIAGNOSIS — L97322 Non-pressure chronic ulcer of left ankle with fat layer exposed: Secondary | ICD-10-CM | POA: Insufficient documentation

## 2016-08-29 NOTE — Progress Notes (Signed)
DEQUANDRE, CORDOVA ([id number]) Visit Report for 08/28/2016 Arrival Information Details Patient Name: [name], [name] Date of Service: 08/28/2016 2:15 PM Medical Record Number: [id number] Patient Account Number: [id number] Date of Birth/Sex: [birth year] (60 y.o. Male) Treating RN: Clover Mealy, RN, BSN, [facility] Primary Care Physician: FITZGERALD, [name] Other Clinician: Referring Physician: FITZGERALD, [name] Treating Physician/Extender: Altamese [place] in Treatment: 10 Visit Information History Since Last Visit All ordered tests and consults were completed: No Patient Arrived: Wheel Chair Added or deleted any medications: No Arrival Time: 14:03 Any new allergies or adverse reactions: No Accompanied By: self Had a fall or experienced change in No activities of daily living that may affect Transfer Assistance: None risk of falls: Patient Identification Verified: Yes Signs or symptoms of abuse/neglect since last No Secondary Verification Process Yes visito Completed: Hospitalized since last visit: No Patient Requires Transmission-Based No Has Dressing in Place as Prescribed: Yes Precautions: Pain Present Now: No Patient Has Alerts: No Electronic Signature(s) Signed: 08/28/2016 4:23:09 PM By: Elpidio Eric BSN, RN Entered By: Elpidio Eric on 08/28/2016 14:03:29 [name] [name] ([id number]) -------------------------------------------------------------------------------- Clinic Level of Care Assessment Details Patient Name: [name] [name] Date of Service: 08/28/2016 2:15 PM Medical Record Number: [id number] Patient Account Number: [id number] Date of Birth/Sex: November 14, 1956 (60 y.o. Male) Treating RN: Clover Mealy, RN, BSN, Rita Primary Care Physician: FITZGERALD, [name] Other Clinician: Referring Physician: FITZGERALD, [name] Treating Physician/Extender: Altamese [place] in Treatment: 10 Clinic Level of Care Assessment Items TOOL 4 Quantity Score []  - Use when only an EandM is performed on FOLLOW-UP visit  0 ASSESSMENTS - Nursing Assessment / Reassessment X - Reassessment of Co-morbidities (includes updates in patient status) 1 10 X - Reassessment of Adherence to Treatment Plan 1 5 ASSESSMENTS - Wound and Skin Assessment / Reassessment X - Simple Wound Assessment / Reassessment - one wound 1 5 []  - Complex Wound Assessment / Reassessment - multiple wounds 0 []  - Dermatologic / Skin Assessment (not related to wound area) 0 ASSESSMENTS - Focused Assessment []  - Circumferential Edema Measurements - multi extremities 0 []  - Nutritional Assessment / Counseling / Intervention 0 X - Lower Extremity Assessment (monofilament, tuning fork, pulses) 1 5 []  - Peripheral Arterial Disease Assessment (using hand held doppler) 0 ASSESSMENTS - Ostomy and/or Continence Assessment and Care []  - Incontinence Assessment and Management 0 []  - Ostomy Care Assessment and Management (repouching, etc.) 0 PROCESS - Coordination of Care X - Simple Patient / Family Education for ongoing care 1 15 []  - Complex (extensive) Patient / Family Education for ongoing care 0 []  - Staff obtains Chiropractor, Records, Test Results / Process Orders 0 []  - Staff telephones HHA, Nursing Homes / Clarify orders / etc 0 []  - Routine Transfer to another Facility (non-emergent condition) 0 [name], [name] ([id number]) []  - Routine Hospital Admission (non-emergent condition) 0 []  - New Admissions / Manufacturing engineer / Ordering NPWT, Apligraf, etc. 0 []  - Emergency Hospital Admission (emergent condition) 0 []  - Simple Discharge Coordination 0 []  - Complex (extensive) Discharge Coordination 0 PROCESS - Special Needs []  - Pediatric / Minor Patient Management 0 []  - Isolation Patient Management 0 []  - Hearing / Language / Visual special needs 0 []  - Assessment of Community assistance (transportation, D/C planning, etc.) 0 []  - Additional assistance / Altered mentation 0 []  - Support Surface(s) Assessment (bed, cushion, seat, etc.)  0 INTERVENTIONS - Wound Cleansing / Measurement X - Simple Wound Cleansing - one wound 1 5 []  - Complex Wound Cleansing - multiple wounds 0 X - Wound  Imaging (photographs - any number of wounds) 1 5 []  - Wound Tracing (instead of photographs) 0 []  - Simple Wound Measurement - one wound 0 []  - Complex Wound Measurement - multiple wounds 0 INTERVENTIONS - Wound Dressings []  - Small Wound Dressing one or multiple wounds 0 []  - Medium Wound Dressing one or multiple wounds 0 []  - Large Wound Dressing one or multiple wounds 0 []  - Application of Medications - topical 0 []  - Application of Medications - injection 0 INTERVENTIONS - Miscellaneous []  - External ear exam 0 [name], [name] ([id number]) []  - Specimen Collection (cultures, biopsies, blood, body fluids, etc.) 0 []  - Specimen(s) / Culture(s) sent or taken to Lab for analysis 0 []  - Patient Transfer (multiple staff / Michiel Sites Lift / Similar devices) 0 []  - Simple Staple / Suture removal (25 or less) 0 []  - Complex Staple / Suture removal (26 or more) 0 []  - Hypo / Hyperglycemic Management (close monitor of Blood Glucose) 0 []  - Ankle / Brachial Index (ABI) - do not check if billed separately 0 X - Vital Signs 1 5 Has the patient been seen at the hospital within the last three years: Yes Total Score: 55 Level Of Care: New/Established - Level 2 Electronic Signature(s) Signed: 08/28/2016 4:23:09 PM By: Elpidio Eric BSN, RN Entered By: Elpidio Eric on 08/28/2016 15:03:02 [name] [name] ([id number]) -------------------------------------------------------------------------------- Encounter Discharge Information Details Patient Name: [name] [name] Date of Service: 08/28/2016 2:15 PM Medical Record Number: [id number] Patient Account Number: [id number] Date of Birth/Sex: 05/12/1956 (60 y.o. Male) Treating RN: Clover Mealy, RN, BSN, Rita Primary Care Physician: FITZGERALD, [name] Other Clinician: Referring Physician: FITZGERALD, [name] Treating  Physician/Extender: Altamese [place] in Treatment: 10 Encounter Discharge Information Items Discharge Pain Level: 0 Discharge Condition: Stable Ambulatory Status: Wheelchair Discharge Destination: Home Transportation: Private Auto Accompanied By: self Schedule Follow-up Appointment: No Medication Reconciliation completed No and provided to Patient/Care Britne Borelli: Patient Clinical Summary of Care: Declined Electronic Signature(s) Signed: 08/28/2016 4:23:09 PM By: Elpidio Eric BSN, RN Previous Signature: 08/28/2016 2:46:07 PM Version By: Gwenlyn Perking Entered By: Elpidio Eric on 08/28/2016 15:03:32 [name] [name] ([id number]) -------------------------------------------------------------------------------- Lower Extremity Assessment Details Patient Name: [name] [name] Date of Service: 08/28/2016 2:15 PM Medical Record Number: [id number] Patient Account Number: [id number] Date of Birth/Sex: 1956-08-03 (60 y.o. Male) Treating RN: Clover Mealy, RN, BSN, Rita Primary Care Physician: FITZGERALD, [name] Other Clinician: Referring Physician: FITZGERALD, [name] Treating Physician/Extender: Maxwell Caul Weeks in Treatment: 10 Edema Assessment Assessed: [Left: No] [Right: No] E[Left: dema] [Right: :] Calf Left: Right: Point of Measurement: 36 cm From Medial Instep 38.4 cm cm Ankle Left: Right: Point of Measurement: 14 cm From Medial Instep 31.5 cm cm Vascular Assessment Claudication: Claudication Assessment [Left:None] Pulses: Posterior Tibial Dorsalis Pedis Palpable: [Left:Yes] Extremity colors, hair growth, and conditions: Extremity Color: [Left:Dusky] Hair Growth on Extremity: [Left:No] Temperature of Extremity: [Left:Warm] Capillary Refill: [Left:< 3 seconds] Toe Nail Assessment Left: Right: Thick: Yes Discolored: Yes Deformed: No Improper Length and Hygiene: No Electronic Signature(s) Signed: 08/28/2016 4:23:09 PM By: Elpidio Eric BSN, RN Entered By: Elpidio Eric on 08/28/2016  14:04:10 [name] [name] ([id number]) Kathaleen [name], [name] Loft ([id number]) -------------------------------------------------------------------------------- Multi Wound Chart Details Patient Name: [name] [name] Date of Service: 08/28/2016 2:15 PM Medical Record Number: [id number] Patient Account Number: [id number] Date of Birth/Sex: [birth year] (60 y.o. 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] Comorbid History: [8:Cataracts, Anemia, Arrhythmia, Congestive Heart Failure, Coronary Artery Disease, Hypertension, Myocardial Infarction, Type II Diabetes, End Stage Renal Disease, Osteoarthritis, Neuropathy] [N/A:N/A] Date Acquired: [8:07/24/2016] [N/A:N/A] Weeks of Treatment: [8:4] [N/A:N/A] Wound Status: [8:Healed - Epithelialized] [N/A:N/A] Measurements L x W x D 0x0x0 [N/A:N/A] (cm) Area (cm) : [8:0] [N/A:N/A] Volume (cm) : [8:0] [N/A:N/A] % Reduction in Area: [8:100.00%] [N/A:N/A] % Reduction in Volume: 100.00% [N/A:N/A] Classification: [8:Grade 1] [N/A:N/A] Exudate Amount: [8:None Present] [N/A:N/A] Wound Margin: [8:Distinct, outline attached N/A] Granulation Amount: [8:None Present (0%)] [N/A:N/A] Necrotic Amount: [8:None Present (0%)] [N/A:N/A] Exposed Structures: [8:Fascia: No Fat: No] [N/A:N/A] Tendon: No Muscle: No Joint: No Bone: No Limited to Skin Breakdown Epithelialization: Large (67-100%) N/A N/A Periwound Skin Texture: Edema: Yes N/A N/A Excoriation:  No Induration: No Callus: No Crepitus: No Fluctuance: No Friable: No Rash: No Scarring: No Periwound Skin Dry/Scaly: Yes N/A N/A Moisture: Maceration: No Moist: No Periwound Skin Color: Atrophie Blanche: No N/A N/A Cyanosis: No Ecchymosis: No Erythema: No Hemosiderin Staining: No Mottled: No Pallor: No Rubor: No Temperature: No Abnormality N/A N/A Tenderness on Yes N/A N/A Palpation: Wound Preparation: Ulcer Cleansing: N/A N/A Rinsed/Irrigated with Saline, Other: surg scrub and water Topical Anesthetic Applied: None Treatment Notes Electronic Signature(s) Signed: 08/28/2016 4:34:37 PM By: Elpidio Eric BSN, RN Entered By: Elpidio Eric on 08/28/2016 16:34:36 [name] [name] ([id number]) -------------------------------------------------------------------------------- Multi-Disciplinary Care Plan Details Patient Name: [name] [name] Date of Service: 08/28/2016 2:15 PM Medical Record Number: [id number] Patient Account Number: [id number] Date of Birth/Sex: Oct 18, 1956 (59 y.o. Male) Treating RN: Clover Mealy, RN, BSN, Rita Primary Care Physician: FITZGERALD, [name] Other Clinician: Referring Physician: FITZGERALD, [name] Treating Physician/Extender: Altamese [place] in Treatment: 10 Active Inactive Electronic Signature(s) Signed: 08/28/2016 4:23:09 PM By: Elpidio Eric BSN, RN Entered By: Elpidio Eric on 08/28/2016 14:38:40 [name] [name] ([id number]) -------------------------------------------------------------------------------- Pain Assessment Details Patient Name: [name] [name] Date of Service: 08/28/2016 2:15 PM Medical Record Number: [id number] Patient Account Number: [id number] Date of Birth/Sex: 1956/08/08 (60 y.o. Male) Treating RN: Clover Mealy, RN, BSN, Rita Primary Care Physician: FITZGERALD, [name] Other Clinician: Referring Physician: FITZGERALD, [name] Treating Physician/Extender: Maxwell Caul Weeks in Treatment: 10 Active Problems Location of Pain Severity and Description of  Pain Patient Has Paino No Site Locations With Dressing Change: No Pain Management and Medication Current Pain Management: Electronic Signature(s) Signed: 08/28/2016 4:23:09 PM By: Elpidio Eric BSN, RN Entered By: Elpidio Eric on 08/28/2016 14:03:37 [name] [name] ([id number]) -------------------------------------------------------------------------------- Patient/Caregiver Education Details Patient Name: [name] [name] Date of Service: 08/28/2016 2:15 PM Medical Record Number: [id number] Patient Account Number: [id number] Date of Birth/Gender: [birth year] (60 y.o. Male) Treating RN: Clover Mealy, RN, BSN, Rita Primary Care Physician: FITZGERALD, [name] Other Clinician: Referring Physician: FITZGERALD, [name] Treating Physician/Extender: Altamese  in Treatment: 10 Education Assessment Education Provided To: Patient Education Topics Provided Electronic Signature(s) Signed: 08/28/2016 4:23:09 PM By: Elpidio Eric BSN, RN Entered By: Elpidio Eric on 08/28/2016 15:03:38 [name] [name] ([id number]) -------------------------------------------------------------------------------- Wound Assessment Details Patient Name: [name] [name] Date of Service: 08/28/2016 2:15 PM Medical Record Number: [id number] Patient Account Number: [id number] Date of Birth/Sex: Jun 15, 1956 (60 y.o. Male) Treating RN: Clover Mealy, RN, BSN, Rita Primary Care Physician: FITZGERALD, [name] Other Clinician: Referring Physician: FITZGERALD, [name] Treating Physician/Extender: Maxwell Caul Weeks in Treatment: 10 Wound Status Wound Number: 8 Primary Diabetic Wound/Ulcer of  the Lower Etiology: Extremity Wound Location: Left Lower Leg - Anterior Wound Healed - Epithelialized Wounding Event: Gradually Appeared Status: Date Acquired: 07/24/2016 Comorbid Cataracts, Anemia, Arrhythmia, Weeks Of Treatment: 4 History: Congestive Heart Failure, Coronary Clustered Wound: No Artery Disease, Hypertension, Myocardial Infarction, Type II  Diabetes, End Stage Renal Disease, Osteoarthritis, Neuropathy Photos Photo Uploaded By: Elpidio EricAfful, Rita on 08/29/2016 11:55:29 Wound Measurements Length: (cm) 0 % Reducti Width: (cm) 0 % Reducti Depth: (cm) 0 Epithelia Area: (cm) 0 Tunnelin Volume: (cm) 0 Undermin on in Area: 100% on in Volume: 100% lization: Large (67-100%) g: No ing: No Wound Description Classification: Grade 1 Wound Margin: Distinct, outline attached Exudate Amount: None Present Foul Odor After Cleansing: No Wound Bed Granulation Amount: None Present (0%) Exposed Structure Necrotic Amount: None Present (0%) Fascia Exposed: No Fat Layer Exposed: No [name], [name] ([id number]) Tendon Exposed: No Muscle Exposed: No Joint Exposed: No Bone Exposed: No Limited to Skin Breakdown Periwound Skin Texture Texture Color No Abnormalities Noted: No No Abnormalities Noted: No Callus: No Atrophie Blanche: No Crepitus: No Cyanosis: No Excoriation: No Ecchymosis: No Fluctuance: No Erythema: No Friable: No Hemosiderin Staining: No Induration: No Mottled: No Localized Edema: Yes Pallor: No Rash: No Rubor: No Scarring: No Temperature / Pain Moisture Temperature: No Abnormality No Abnormalities Noted: No Tenderness on Palpation: Yes Dry / Scaly: Yes Maceration: No Moist: No Wound Preparation Ulcer Cleansing: Rinsed/Irrigated with Saline, Other: surg scrub and water, Topical Anesthetic Applied: None Electronic Signature(s) Signed: 08/28/2016 4:23:09 PM By: Elpidio EricAfful, Rita BSN, RN Entered By: Elpidio EricAfful, Rita on 08/28/2016 15:02:15 [name] [name], [name] [name] ([id number]) -------------------------------------------------------------------------------- Vitals Details Patient Name: [name] [name], [name] Date of Service: 08/28/2016 2:15 PM Medical Record Number: [id number] Patient Account Number: [id number] Date of Birth/Sex: 08/24/1956 [age] y.o. Male) Treating RN: Clover MealyAfful, RN, BSN, Rita Primary Care Physician: FITZGERALD, [name] Other  Clinician: Referring Physician: FITZGERALD, [name] Treating Physician/Extender: Maxwell CaulOBSON, MICHAEL G Weeks in Treatment: 10 Vital Signs Time Taken: 14:15 Temperature (F): 98.1 Height (in): 72 Pulse (bpm): 84 Respiratory Rate (breaths/min): 18 Blood Pressure (mmHg): 115/54 Reference Range: 80 - 120 mg / dl Electronic Signature(s) Signed: 08/28/2016 4:23:09 PM By: Elpidio EricAfful, Rita BSN, RN Entered By: Elpidio EricAfful, Rita on 08/28/2016 14:38:26

## 2016-08-29 NOTE — Progress Notes (Signed)
[name], [name] ([id number]) Visit Report for 08/28/2016 Chief Complaint Document Details Patient Name: [name] [name], [name] [name] Date of Service: 08/28/2016 2:15 PM Medical Record Patient Account Number: [id number] Number: Treating RN: Clover Mealy, RN, BSN, Rita Oct 24, 1956 [phone] y.o. Other Clinician: Date of Birth/Sex: Male) Treating Bowen Goyal Primary Care Physician: [name], [name] Physician/Extender: G Referring Physician: FITZGERALD, [name] Weeks in Treatment: 10 Information Obtained from: Patient Chief Complaint Right second toe wound and left lateral malleolar wound. is doing fine and has no fresh issues. He does have his appointment with the vascular surgeons. However he does not have an appointment with the orthopedic surgeons yet. he did not come for 2 weeks because of the bad weather. He has no fresh issues. 04/10/16; the patient returns today with a wound over the last 2-3 weeks on the anterior part of his left ankle. 07/31/2016 -- the patient was recently discharged 3 weeks ago and comes back with a reopened wound the left anterior ankle has been there for about 2 weeks Electronic Signature(s) Signed: 08/29/2016 7:55:14 AM By: Baltazar Najjar MD Entered By: Baltazar Najjar on 08/28/2016 15:09:47 [name] [name] ([id number]) -------------------------------------------------------------------------------- HPI Details Patient Name: [name] [name] Date of Service: 08/28/2016 2:15 PM Medical Record Patient Account Number: [id number] Number: Treating RN: Clover Mealy, RN, BSN, Rita [birth year] [phone] y.o. Other Clinician: Date of Birth/Sex: Male) Treating Alissa Pharr Primary Care Physician: [name], [name] Physician/Extender: G Referring Physician: FITZGERALD, [name] Weeks in Treatment: 10 History of Present Illness Location: left fourth toe Quality: Patient reports experiencing a dull pain to affected area(s). Severity: Patient states wound (s) are getting better. Duration: Patient  states that they are not certain how long the wound has been present. Timing: Pain in wound is constant (hurts all the time) Context: The wound appeared gradually over time Associated Signs and Symptoms: nil HPI Description: has been doing fine and has no fresh issues. He does say that he is trying to get all his appointments in order and at present he has a appointment with the vascular surgeons. While at home he removes his walking shoe and keeps the leg and foot open. There is no pain and minimal drainage. 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 He tells me that over the last 2-3 weeks he has noticed pain and a wound on the crease of his left dorsal ankle and he is here for our review events. Because of the deformity in his ankle he has a fracture boot that he uses the walker. This was made at [facility] in [facility]. [facility] he was in hospital in January and suffered a wide complex cardiac arrest at the time of the fistulagram. He has recovered from this. 04/17/16 a his left dorsal ankle appears to be improved. He has an appointment with [employer] to adjust his boot next week. He does not appear to have significant PAD but does have significant venous insufficiencywent 04/24/16; went to [facility] last week,did not adjust his boot due to edema. She has called his cardiologist and in the meantime they have apparently taken some extra fluid off him at dialysis. He dialyzes Monday Wednesday and Friday 05/01/16; the patient arrived with some scant surface eschar over his dorsal ankle  wound. This was debridement. There is nothing but normal skin under this. His leg seems to have done well with a 2  layer compression we put on this last week accompanied by extra fluid removal at dialysis's resulted in less edema of his leg. He has chronic venous insufficiency with resultant edema been eating his chronic renal failure and deformity of his left ankle. 06/13/16; the patient arrives today with a recurrent wound in the same spot as last time. He has chronic venous insufficiency also deformity of the foot and ankle likely secondary to Charcot deformity. The patient has type 2 diabetes with polyneuropathy. He states the stockings he was wearing were too tight and he feels that's why it rubbed his ankle. He also has a Systems developer, that was adjusted last time. LARNIE, HEART ([id number]) 06/19/16; Arrives today with a new wound on the right anterior leg. The original wound on the crease of his dorsal left ankle in the setting of chronic venous insufficiency and a Charcot deformity in this area is roughly the same. 06/27/16; the area on the right anterior leg is just about closed over, only 2 small areas remaining. The original wound in the crease of the dorsal left ankle in the setting of chronic venous insufficiency and a Charcot deformity appears improved. The debridement of adherent surface slough and nonviable subcutaneous tissue post debridement everything looks quite healthy here. 07/03/16: right lower leg wound healed. left lower leg wound improved. no reports of systemic s/s of infection. 07/10/16; right lower leg wound is healed. The area on his left anterior ankle covered in and eschar. 07/31/2016 -- the patient was recently discharged in the middle of July returns in about 3 weeks with a fresh wound which has reopened. He is a diabetic with chronic venous insufficiency and also a Charcot foot. he says he started wearing compression stockings which have caused him an abrasion on the deformed ankle. Not find any evidence of venous insufficiency in the past. Review of his vascular studies done in  June 2016 showed a right ABI of 1.15 and a left ABI of 1.11 08/14/16; readmission noted; wound debrided. This does not look unhealthy. The patient states that the wound happened 2 days after he purchased some form of compression stocking, perhaps rolled up and irritated this area although this simply may be an area of compression in his custom boot 08/21/16; wound on the dorsal left ankle. In the setting of a inversion deformity, Charcot foot. Wound bed continues to look improved 08/28/16; wound on the dorsal aspect of his left ankle in the setting of a severe inversion deformity, Charcot deformity. Wound bed is completely healed and epithelialized. Electronic Signature(s) Signed: 08/29/2016 7:55:14 AM By: Baltazar Najjar MD Entered By: Baltazar Najjar on 08/28/2016 15:10:37 [name] [name] ([id number]) -------------------------------------------------------------------------------- Physical Exam Details Patient Name: [name] [name] Date of Service: 08/28/2016 2:15 PM Medical Record Patient Account Number: [id number] Number: Treating RN: Clover Mealy, RN, BSN, Rita [birth year] [phone] y.o. Other Clinician: Date of Birth/Sex: Male) Treating Beck Cofer Primary Care Physician: [name], [name] Physician/Extender: G Referring Physician: FITZGERALD, [name] Weeks in Treatment: 10 Constitutional Sitting or standing Blood Pressure is within target range for patient.. Pulse regular and within target range for patient.Marland Kitchen Respirations regular, non-labored and within target range.. Temperature is normal and within the target range for the patient.. Patient's appearance is neat and clean. Appears in no acute distress. Well nourished and well developed.. Cardiovascular Pedal pulses palpable and strong bilaterally.. Edema present in both extremities. Chronic venous  insufficiency. Notes Wound exam; the area on the dorsal aspect of his left ankle is fully epithelialized. No evidence of infection or  ischemia Electronic Signature(s) Signed: 08/29/2016 7:55:14 AM By: Baltazar Najjar MD Entered By: Baltazar Najjar on 08/28/2016 15:11:38 [name] [name] ([id number]) -------------------------------------------------------------------------------- Physician Orders Details Patient Name: [name] [name] Date of Service: 08/28/2016 2:15 PM Medical Record Patient Account Number: [id number] Number: Treating RN: Clover Mealy, RN, BSN, Rita 1956/10/20 [phone] y.o. Other Clinician: Date of Birth/Sex: Male) Treating Ladean Steinmeyer Primary Care Physician: [name], [name] Physician/Extender: G Referring Physician: [name], [name] Weeks in Treatment: 10 Verbal / Phone Orders: Yes Clinician: Afful, RN, BSN, [place] Read Back and Verified: Yes Diagnosis Coding Discharge From [facility] Services o Discharge from Wound Care Center - treatment Completed Electronic Signature(s) Signed: 08/28/2016 4:23:09 PM By: Elpidio Eric BSN, RN Signed: 08/29/2016 7:55:14 AM By: Baltazar Najjar MD Entered By: Elpidio Eric on 08/28/2016 14:44:36 [name] [name] ([id number]) -------------------------------------------------------------------------------- Problem List Details Patient Name: [name] [name] Date of Service: 08/28/2016 2:15 PM Medical Record Patient Account Number: [id number] Number: Treating RN: Clover Mealy, RN, BSN, Rita 1956-09-14 [phone] y.o. 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/31/2016 Yes exposed Inactive Problems Resolved Problems ICD-10 Code Description Active Date Resolved Date L97.321  Non-pressure chronic ulcer of left ankle limited to 06/13/2016 06/13/2016 breakdown of skin L97.211 Non-pressure chronic ulcer of right calf limited to 06/19/2016 06/19/2016 breakdown of skin Electronic Signature(s) Signed: 08/29/2016 7:55:14 AM By: Baltazar Najjar MD Entered By: Baltazar Najjar on 08/28/2016 15:09:21 [name] [name] ([id number]) -------------------------------------------------------------------------------- Progress Note Details Patient Name: [name] [name] Date of Service: 08/28/2016 2:15 PM Medical Record Patient Account Number: [id number] Number: Treating RN: Clover Mealy, RN, BSN, Rita 1956/08/28 (59 y.o. Other Clinician: Date of Birth/Sex: Male) Treating Mekhi Lascola Primary Care Physician: [name], [name] Physician/Extender: G Referring Physician: FITZGERALD, [name] Weeks in Treatment: 10 Subjective Chief Complaint Information obtained from Patient Right second toe wound and left lateral malleolar wound. is doing fine and has no fresh issues. He does have his appointment with the vascular surgeons. However he does not have an appointment with the orthopedic surgeons yet. he did not come for 2 weeks because of the bad weather. He has no fresh issues. 04/10/16; the patient returns today with a wound over the last 2-3 weeks on the anterior part of his left ankle. 07/31/2016 -- the patient was recently discharged 3 weeks ago and comes back with a reopened wound the left anterior ankle has been there for about 2 weeks History of Present Illness (HPI) The following HPI elements were documented for the patient's wound: Location: left fourth toe Quality: Patient reports experiencing a dull pain to affected area(s). Severity: Patient states wound (s) are getting better. Duration: Patient states that they are not certain how long the wound has been present. Timing: Pain in wound is constant (hurts all the time) Context: The wound appeared gradually over time Associated  Signs and Symptoms: nil has been doing fine and has no fresh issues. He does say that he is trying to get all his appointments in order and at present he has a appointment with the vascular surgeons. While at home he removes his walking shoe and keeps the  leg and foot open. There is no pain and minimal drainage. 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 He tells me that over the last 2-3 weeks he has noticed pain and a wound on the crease of his left dorsal ankle and he is here for our review events. Because of the deformity in his ankle he has a fracture boot that he uses the walker. [facility] [place] [place]. [facility] he was in hospital in January and suffered a wide complex cardiac arrest at the time of the fistulagram. He has recovered from this. 04/17/16 a his left dorsal ankle appears to be improved. He has an appointment with [employer] to adjust his boot next week. He does not appear to have significant PAD but does have significant venous insufficiencywent 04/24/16; went to [employer] last week,did not adjust his boot due to edema. She has called his cardiologist and Menden, Laquentin ([id number]) in the meantime they have apparently taken some extra fluid off him at dialysis. He dialyzes Monday Wednesday and Friday 05/01/16; the patient arrived with some scant surface eschar over his dorsal ankle wound. This was debridement. There is nothing but normal skin under this. His leg seems to have done well with a 2 layer compression we put on this last week accompanied by extra fluid removal at dialysis's resulted in less edema of his leg. He has chronic venous insufficiency with resultant edema  been eating his chronic renal failure and deformity of his left ankle. 06/13/16; the patient arrives today with a recurrent wound in the same spot as last time. He has chronic venous insufficiency also deformity of the foot and ankle likely secondary to Charcot deformity. The patient has type 2 diabetes with polyneuropathy. He states the stockings he was wearing were too tight and he feels that's why it rubbed his ankle. He also has a Systems developerCharcot boot, that was adjusted last time. 06/19/16; Arrives today with a new wound on the right anterior leg. The original wound on the crease of his dorsal left ankle in the setting of chronic venous insufficiency and a Charcot deformity in this area is roughly the same. 06/27/16; the area on the right anterior leg is just about closed over, only 2 small areas remaining. The original wound in the crease of the dorsal left ankle in the setting of chronic venous insufficiency and a Charcot deformity appears improved. The debridement of adherent surface slough and nonviable subcutaneous tissue post debridement everything looks quite healthy here. 07/03/16: right lower leg wound healed. left lower leg wound improved. no reports of systemic s/s of infection. 07/10/16; right lower leg wound is healed. The area on his left anterior ankle covered in and eschar. 07/31/2016 -- the patient was recently discharged in the middle of July returns in about 3 weeks with a fresh wound which has reopened. He is a diabetic with chronic venous insufficiency and also a Charcot foot. he says he started wearing compression stockings which have caused him an abrasion on the deformed ankle. Not find any evidence of venous insufficiency in the past. Review of his vascular studies done in June 2016 showed a right ABI of 1.15 and a left  ABI of 1.11 08/14/16; readmission noted; wound debrided. This does not look unhealthy. The patient states that the wound happened 2 days after he purchased some  form of compression stocking, perhaps rolled up and irritated this area although this simply may be an area of compression in his custom boot 08/21/16; wound on the dorsal left ankle. In the setting of a inversion deformity, Charcot foot. Wound bed continues to look improved 08/28/16; wound on the dorsal aspect of his left ankle in the setting of a severe inversion deformity, Charcot deformity. Wound bed is completely healed and epithelialized. Objective Constitutional Sitting or standing Blood Pressure is within target range for patient.. Pulse regular and within target range for patient.Marland Kitchen Respirations regular, non-labored and within target range.. Temperature is normal and within the target range for the patient.. Patient's appearance is neat and clean. Appears in no acute distress. Well nourished and well developed.Kathaleen Bury, Aurther Loft ([id number]) Vitals Time Taken: 2:15 PM, Height: 72 in, Temperature: 98.1 F, Pulse: 84 bpm, Respiratory Rate: 18 breaths/min, Blood Pressure: 115/54 mmHg. Cardiovascular Pedal pulses palpable and strong bilaterally.. Edema present in both extremities. Chronic venous insufficiency. General Notes: Wound exam; the area on the dorsal aspect of his left ankle is fully epithelialized. No evidence of infection or ischemia Integumentary (Hair, Skin) Wound #8 status is Healed - Epithelialized. Original cause of wound was Gradually Appeared. The wound is located on the Left,Anterior Lower Leg. The wound measures 0cm length x 0cm width x 0cm depth; 0cm^2 area and 0cm^3 volume. The wound is limited to skin breakdown. There is no tunneling or undermining noted. There is a none present amount of drainage noted. The wound margin is distinct with the outline attached to the wound base. There is no granulation within the wound bed. There is no necrotic tissue within the wound bed. The periwound skin appearance exhibited: Localized Edema, Dry/Scaly. The periwound skin appearance  did not exhibit: Callus, Crepitus, Excoriation, Fluctuance, Friable, Induration, Rash, Scarring, Maceration, Moist, Atrophie Blanche, Cyanosis, Ecchymosis, Hemosiderin Staining, Mottled, Pallor, Rubor, Erythema. Periwound temperature was noted as No Abnormality. The periwound has tenderness on palpation. Assessment Active Problems ICD-10 E11.42 - Type 2 diabetes mellitus with diabetic polyneuropathy M14.672 - Charcot's joint, left ankle and foot L97.322 - Non-pressure chronic ulcer of left ankle with fat layer exposed Plan Discharge From [facility] Services: Discharge from Wound Care Center - treatment Completed Delisle, Elimelech ([id number]) The area is resolved. We have suggested contiued use of a foam cover to use while he is in his custom orthotic Electronic Signature(s) Signed: 08/29/2016 7:55:14 AM By: Baltazar Najjar MD Entered By: Baltazar Najjar on 08/28/2016 15:13:27 [name] [name] ([id number]) -------------------------------------------------------------------------------- SuperBill Details Patient Name: [name] [name] Date of Service: 08/28/2016 Medical Record Patient Account Number: [id number] Number: Treating RN: Clover Mealy, RN, BSN, Rita 1956-03-10 [phone] y.o. Other Clinician: Date of Birth/Sex: Male) Treating Mattia Osterman Primary Care Physician: [name], [name] Physician/Extender: G Referring Physician: FITZGERALD, [name] Weeks in Treatment: 10 Diagnosis Coding ICD-10 Codes Code Description E11.42 Type 2 diabetes mellitus with diabetic polyneuropathy M14.672 Charcot's joint, left ankle and foot L97.322 Non-pressure chronic ulcer of left ankle with fat layer exposed Facility Procedures CPT4 Code: [id number] Description: [phone] - WOUND CARE VISIT-LEV 2 EST PT Modifier: Quantity: 1 Physician Procedures CPT4 Code: [id number] Description: 10272 - WC PHYS LEVEL 2 - EST PT ICD-10 Description Diagnosis L97.322 Non-pressure chronic ulcer of left ankle with fat Modifier: layer  exposed Quantity: 1 Electronic Signature(s) Signed: 08/29/2016 7:55:14 AM By: Baltazar Najjar MD Entered By: Leanord Hawking,  Siearra Amberg on 08/28/2016 15:13:59

## 2016-10-18 ENCOUNTER — Emergency Department
Admission: EM | Admit: 2016-10-18 | Discharge: 2016-10-18 | Disposition: A | Discharge status: Home / Self Care | Payer: Medicare Other | Attending: Emergency Medicine | Admitting: Emergency Medicine

## 2016-10-18 ENCOUNTER — Emergency Department: Payer: Medicare Other

## 2016-10-18 ENCOUNTER — Other Ambulatory Visit: Payer: Self-pay

## 2016-10-18 ENCOUNTER — Encounter: Payer: Self-pay | Admitting: Emergency Medicine

## 2016-10-18 DIAGNOSIS — Z79899 Other long term (current) drug therapy: Secondary | ICD-10-CM | POA: Diagnosis not present

## 2016-10-18 DIAGNOSIS — N186 End stage renal disease: Secondary | ICD-10-CM | POA: Diagnosis not present

## 2016-10-18 DIAGNOSIS — J181 Lobar pneumonia, unspecified organism: Secondary | ICD-10-CM

## 2016-10-18 DIAGNOSIS — Z992 Dependence on renal dialysis: Secondary | ICD-10-CM | POA: Diagnosis not present

## 2016-10-18 DIAGNOSIS — R059 Cough, unspecified: Secondary | ICD-10-CM

## 2016-10-18 DIAGNOSIS — R05 Cough: Secondary | ICD-10-CM | POA: Diagnosis present

## 2016-10-18 DIAGNOSIS — Z794 Long term (current) use of insulin: Secondary | ICD-10-CM | POA: Insufficient documentation

## 2016-10-18 DIAGNOSIS — Z7982 Long term (current) use of aspirin: Secondary | ICD-10-CM | POA: Diagnosis not present

## 2016-10-18 DIAGNOSIS — I120 Hypertensive chronic kidney disease with stage 5 chronic kidney disease or end stage renal disease: Secondary | ICD-10-CM | POA: Insufficient documentation

## 2016-10-18 DIAGNOSIS — E1122 Type 2 diabetes mellitus with diabetic chronic kidney disease: Secondary | ICD-10-CM | POA: Insufficient documentation

## 2016-10-18 DIAGNOSIS — J209 Acute bronchitis, unspecified: Secondary | ICD-10-CM | POA: Diagnosis not present

## 2016-10-18 DIAGNOSIS — J189 Pneumonia, unspecified organism: Secondary | ICD-10-CM | POA: Insufficient documentation

## 2016-10-18 LAB — BASIC METABOLIC PANEL
ANION GAP: 12 (ref 5–15)
BUN: 21 mg/dL — ABNORMAL HIGH (ref 6–20)
CALCIUM: 9.7 mg/dL (ref 8.9–10.3)
CO2: 31 mmol/L (ref 22–32)
Chloride: 97 mmol/L — ABNORMAL LOW (ref 101–111)
Creatinine, Ser: 5.78 mg/dL — ABNORMAL HIGH (ref 0.61–1.24)
GFR, EST AFRICAN AMERICAN: 11 mL/min — AB (ref >60)
GFR, EST NON AFRICAN AMERICAN: 10 mL/min — AB (ref >60)
Glucose, Bld: 133 mg/dL — ABNORMAL HIGH (ref 65–99)
Potassium: 3.9 mmol/L (ref 3.5–5.1)
Sodium: 140 mmol/L (ref 135–145)

## 2016-10-18 LAB — CBC WITH DIFFERENTIAL/PLATELET
BASOS ABS: 0.1 10*3/uL (ref 0–0.1)
BASOS PCT: 1 %
EOS PCT: 3 %
Eosinophils Absolute: 0.2 10*3/uL (ref 0–0.7)
HEMATOCRIT: 33.5 % — AB (ref 40.0–52.0)
Hemoglobin: 11.1 g/dL — ABNORMAL LOW (ref 13.0–18.0)
Lymphocytes Relative: 16 %
Lymphs Abs: 1 10*3/uL (ref 1.0–3.6)
MCH: 29.2 pg (ref 26.0–34.0)
MCHC: 33.1 g/dL (ref 32.0–36.0)
MCV: 88.1 fL (ref 80.0–100.0)
MONO ABS: 0.9 10*3/uL (ref 0.2–1.0)
Monocytes Relative: 14 %
NEUTROS ABS: 4.4 10*3/uL (ref 1.4–6.5)
Neutrophils Relative %: 66 %
PLATELETS: 196 10*3/uL (ref 150–440)
RBC: 3.81 MIL/uL — ABNORMAL LOW (ref 4.40–5.90)
RDW: 16.9 % — AB (ref 11.5–14.5)
WBC: 6.6 10*3/uL (ref 3.8–10.6)

## 2016-10-18 LAB — TROPONIN I: TROPONIN I: 0.03 ng/mL — AB (ref <0.03)

## 2016-10-18 MED ORDER — LEVOFLOXACIN 500 MG PO TABS: 500.0000 mg | ORAL_TABLET | Freq: Every day | ORAL | 0 refills | Status: AC

## 2016-10-18 MED ORDER — LEVOFLOXACIN 750 MG PO TABS
750.0000 mg | ORAL_TABLET | Freq: Once | ORAL | Status: AC
  Administered 2016-10-18: 750 mg via ORAL
  Filled 2016-10-18: qty 1

## 2016-10-18 MED ORDER — IPRATROPIUM-ALBUTEROL 0.5-2.5 (3) MG/3ML IN SOLN
3.0000 mL | Freq: Once | RESPIRATORY_TRACT | Status: AC
  Administered 2016-10-18: 3 mL via RESPIRATORY_TRACT
  Filled 2016-10-18: qty 3

## 2016-10-18 MED ORDER — DEXAMETHASONE SODIUM PHOSPHATE 10 MG/ML IJ SOLN
6.0000 mg | Freq: Once | INTRAMUSCULAR | Status: AC
  Administered 2016-10-18: 6 mg via INTRAVENOUS
  Filled 2016-10-18: qty 1

## 2016-10-18 MED ORDER — ALBUTEROL SULFATE HFA 108 (90 BASE) MCG/ACT IN AERS: 2.0000 | INHALATION_SPRAY | RESPIRATORY_TRACT | 0 refills | Status: AC | PRN

## 2016-10-18 MED ORDER — GUAIFENESIN 100 MG/5ML PO SOLN: 5.0000 mL | ORAL | 0 refills | Status: AC | PRN

## 2016-10-18 NOTE — ED Provider Notes (Signed)
[facility] Emergency Department Provider Note  ____________________________________________  Time seen: Approximately 12:31 PM  I have reviewed the triage vital signs and the nursing notes.   HISTORY  Chief Complaint Cough    HPI [name] [name] is a 60 y.o. male who comes to the ER for cough for the past month. Nonproductive, no shortness of breath or chest pain. No fevers or chills. He was advised to come get checked out by his dialysis center today. He has no acute concerns.  Has been compliant with dialysis without any issues. Complaining with medications.   Past Medical History:  Diagnosis Date  . Diabetes mellitus without complication (HCC)   . ESRD (end stage renal disease) (HCC)   . GERD (gastroesophageal reflux disease)   . Hyperlipidemia   . Hypertension   . Renal disorder   . Secondary hyperparathyroidism [facility])      Patient Active Problem List   Diagnosis Date Noted  . Cellulitis of left leg 02/07/2016  . Acute upper GI bleed   . GI bleed 01/04/2016     Past Surgical History:  Procedure Laterality Date  . LIGATION OF ARTERIOVENOUS  FISTULA N/A 02/10/2016   Procedure: LIGATION OF ARTERIOVENOUS  FISTULA;  Surgeon: [name], MD;  Location: [facility] ORS;  Service: Vascular;  Laterality: N/A;  Procedure aborted per orders from anesthesiologist after entering Operating room  . PERIPHERAL VASCULAR CATHETERIZATION N/A 11/29/2015   Procedure: A/V Shuntogram/Fistulagram;  Surgeon: [name], MD;  Location: [facility] INVASIVE CV LAB;  Service: Cardiovascular;  Laterality: N/A;  . PERIPHERAL VASCULAR CATHETERIZATION N/A 11/29/2015   Procedure: A/V Shunt Intervention;  Surgeon: [name], MD;  Location: [facility] INVASIVE CV LAB;  Service: Cardiovascular;  Laterality: N/A;  . PERIPHERAL VASCULAR CATHETERIZATION Right 02/07/2016   Procedure: Upper Extremity Angiography;  Surgeon: [name], MD;  Location: [facility] INVASIVE CV LAB;   Service: Cardiovascular;  Laterality: Right;  . PERIPHERAL VASCULAR CATHETERIZATION  02/07/2016   Procedure: Upper Extremity Intervention;  Surgeon: [name], MD;  Location: [facility] INVASIVE CV LAB;  Service: Cardiovascular;;  . PERIPHERAL VASCULAR CATHETERIZATION N/A 02/07/2016   Procedure: Aortic Arch Angiography;  Surgeon: [name], MD;  Location: [facility] INVASIVE CV LAB;  Service: Cardiovascular;  Laterality: N/A;     Prior to Admission medications   Medication Sig Start Date End Date Taking? Authorizing Provider  albuterol (PROVENTIL HFA) 108 (90 Base) MCG/ACT inhaler Inhale 2 puffs into the lungs every 4 (four) hours as needed for wheezing or shortness of breath. 10/18/16   Sharman Cheek, MD  amiodarone (PACERONE) 400 MG tablet Take 1 tablet (400 mg total) by mouth daily. 01/10/16   Houston Siren, MD  aspirin 81 MG chewable tablet Chew 81 mg by mouth every evening.    Historical Provider, MD  b complex-vitamin c-folic acid (NEPHRO-VITE) 0.8 MG TABS tablet Take 1 tablet by mouth daily.    Historical Provider, MD  carvedilol (COREG) 3.125 MG tablet Take 1 tablet (3.125 mg total) by mouth 2 (two) times daily with a meal. 01/10/16   Houston Siren, MD  cephALEXin (KEFLEX) 500 MG capsule Take 1 capsule (500 mg total) by mouth 2 (two) times daily. 02/11/16   Nada Libman, MD  digoxin (LANOXIN) 0.125 MG tablet Take 1 tablet (0.125 mg total) by mouth daily. 02/11/16   Nada Libman, MD  esomeprazole (NEXIUM) 40 MG capsule Take 40 mg by mouth daily.    Historical Provider, MD  guaiFENesin (ROBITUSSIN)  100 MG/5ML SOLN Take 5 mLs (100 mg total) by mouth every 4 (four) hours as needed for cough or to loosen phlegm. 10/18/16   Sharman Cheek, MD  insulin aspart (NOVOLOG) 100 UNIT/ML injection Inject 0-20 Units into the skin 3 (three) times daily with meals as needed for high blood sugar. Pt uses per sliding scale.    Historical Provider, MD  levofloxacin (LEVAQUIN) 500 MG tablet Take  1 tablet (500 mg total) by mouth daily. 10/18/16 10/28/16  Sharman Cheek, MD  sevelamer carbonate (RENVELA) 800 MG tablet Take 1,600-3,200 mg by mouth 5 (five) times daily. Pt takes four capsules three times a day with meals and two capsules two times a day with snacks.    Historical Provider, MD  simvastatin (ZOCOR) 20 MG tablet Take 20 mg by mouth at bedtime.     Historical Provider, MD  traMADol (ULTRAM) 50 MG tablet Take 1 tablet (50 mg total) by mouth every 6 (six) hours as needed. 09/08/15   Chinita Pester, FNP     Allergies Review of patient's allergies indicates no known allergies.   Family History  Problem Relation Age of Onset  . Heart attack Mother   . Lung cancer Father     Social History Social History  Substance Use Topics  . Smoking status: Never Smoker  . Smokeless tobacco: Never Used  . Alcohol use No    Review of Systems  Constitutional:   No fever or chills.  ENT:   No sore throat. No rhinorrhea. Cardiovascular:   No chest pain. Respiratory:   No dyspnea Positive nonproductive cough. Gastrointestinal:   Negative for abdominal pain, vomiting and diarrhea.   Musculoskeletal:   Negative for focal pain or swelling  10-point ROS otherwise negative.  ____________________________________________   PHYSICAL EXAM:  VITAL SIGNS: ED Triage Vitals [10/18/16 1117]  Enc Vitals Group     BP 111/71     Pulse Rate 88     Resp 16     Temp 97.8 F (36.6 C)     Temp Source Oral     SpO2 98 %     Weight 213 lb 13.5 oz (97 kg)     Height 6' (1.829 m)     Head Circumference      Peak Flow      Pain Score 10     Pain Loc      Pain Edu?      Excl. in GC?     Vital signs reviewed, nursing assessments reviewed.   Constitutional:   Alert and oriented. Well appearing and in no distress. Eyes:   No scleral icterus. No conjunctival pallor. PERRL. EOMI.  No nystagmus. ENT   Head:   Normocephalic and atraumatic.   Nose:   No congestion/rhinnorhea. No  septal hematoma   Mouth/Throat:   MMM, no pharyngeal erythema. No peritonsillar mass.    Neck:   No stridor. No SubQ emphysema. No meningismus. Hematological/Lymphatic/Immunilogical:   No cervical lymphadenopathy. Cardiovascular:   RRR. Symmetric bilateral radial and DP pulses.  No murmurs.  Respiratory:   Normal respiratory effort without tachypnea nor retractions. Breath sounds are clear and equal bilaterally. There is inducible expiratory wheezing with forceful expiration.. Gastrointestinal:   Soft and nontender. Non distended. There is no CVA tenderness.  No rebound, rigidity, or guarding. Genitourinary:   deferred Musculoskeletal:   Nontender with normal range of motion in all extremities. No joint effusions.  No lower extremity tenderness.  No edema. Neurologic:   Normal  speech and language.  CN 2-10 normal. Motor grossly intact. No gross focal neurologic deficits are appreciated.  Skin:    Skin is warm, dry and intact. No rash noted.  No petechiae, purpura, or bullae.  ____________________________________________    LABS (pertinent positives/negatives) (all labs ordered are listed, but only abnormal results are displayed) Labs Reviewed  CBC WITH DIFFERENTIAL/PLATELET - Abnormal; Notable for the following:       Result Value   RBC 3.81 (*)    Hemoglobin 11.1 (*)    HCT 33.5 (*)    RDW 16.9 (*)    All other components within normal limits  BASIC METABOLIC PANEL - Abnormal; Notable for the following:    Chloride 97 (*)    Glucose, Bld 133 (*)    BUN 21 (*)    Creatinine, Ser 5.78 (*)    GFR calc non Af Amer 10 (*)    GFR calc Af Amer 11 (*)    All other components within normal limits  TROPONIN I - Abnormal; Notable for the following:    Troponin I 0.03 (*)    All other components within normal limits   ____________________________________________   EKG  Interpreted by me Sinus rhythm rate of 88. Left axis, normal intervals. Poor R-wave progression in anterior  precordial leads. Normal ST segments and T waves.  ____________________________________________    RADIOLOGY  Chest x-ray reveals small infiltrate in the right base consistent with rheumatic or pneumonia.  ____________________________________________   PROCEDURES Procedures  ____________________________________________   INITIAL IMPRESSION / ASSESSMENT AND PLAN / ED COURSE  Pertinent labs & imaging results that were available during my care of the patient were reviewed by me and considered in my medical decision making (see chart for details).  Condition well-appearing no acute distress, normal vital signs. Presents with cough and no other symptoms. He is diabetic on dialysis. X-ray does show that he likely has pneumonia. No evidence of sepsis, not in distress very calm and comfortable, suitable for outpatient follow-up. We'll start on guaifenesin and Levaquin after giving him a low-dose steroid and Levaquin and bronchodilators here. Counseled the patient to monitor his blood sugar closely with the steroid use.     Clinical Course   ____________________________________________   FINAL CLINICAL IMPRESSION(S) / ED DIAGNOSES  Final diagnoses:  Cough  Acute bronchitis, unspecified organism  Community acquired pneumonia of right lower lobe of lung (HCC)       Portions of this note were generated with dragon dictation software. Dictation errors may occur despite best attempts at proofreading.    Sharman CheekPhillip Amorie Rentz, MD 10/18/16 [phone]

## 2016-10-18 NOTE — ED Triage Notes (Signed)
Pt to ed with c/o cough x 1 month.  Pt states he was at dialysis yesterday and they told him he should come to ed to be seen for cough.  Pt appears in no resp distress at this time.  Pt sats 99% on ra.

## 2016-11-29 ENCOUNTER — Other Ambulatory Visit: Payer: Self-pay | Admitting: Infectious Diseases

## 2016-11-29 DIAGNOSIS — R1319 Other dysphagia: Secondary | ICD-10-CM

## 2016-12-07 ENCOUNTER — Ambulatory Visit: Admission: RE | Admit: 2016-12-07 | Payer: Medicare Other | Source: Ambulatory Visit

## 2016-12-24 DEATH — deceased

## 2017-09-10 IMAGING — US US EXTREM LOW VENOUS*L*
1 series · 14 of 24 positions shown · non-contrast
Comparison: 09/08/2015

CLINICAL DATA: Left lower extremity swelling.

EXAM:
LEFT LOWER EXTREMITY VENOUS DOPPLER ULTRASOUND
TECHNIQUE: Gray-scale sonography with graded compression, as well as color
Doppler and duplex ultrasound, were performed to evaluate the deep
venous system from the level of the common femoral vein through the
popliteal and proximal calf veins. Spectral Doppler was utilized to
evaluate flow at rest and with distal augmentation maneuvers.

[Series 1: us extrem low venous*left* · 0.08mm/px · 14 of 49 slices shown]
[im 1/49]
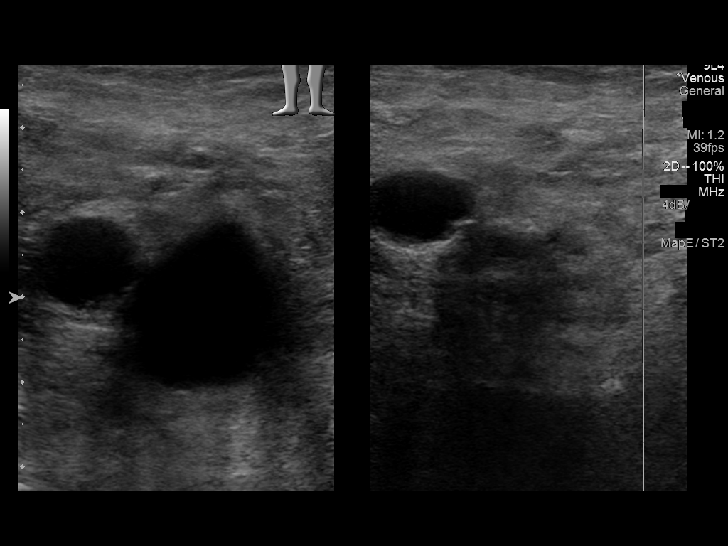
[im 5/49]
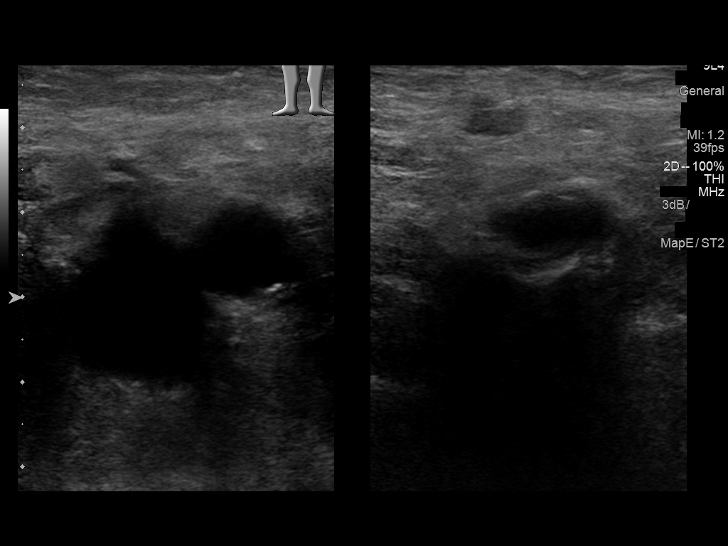
[im 9/49]
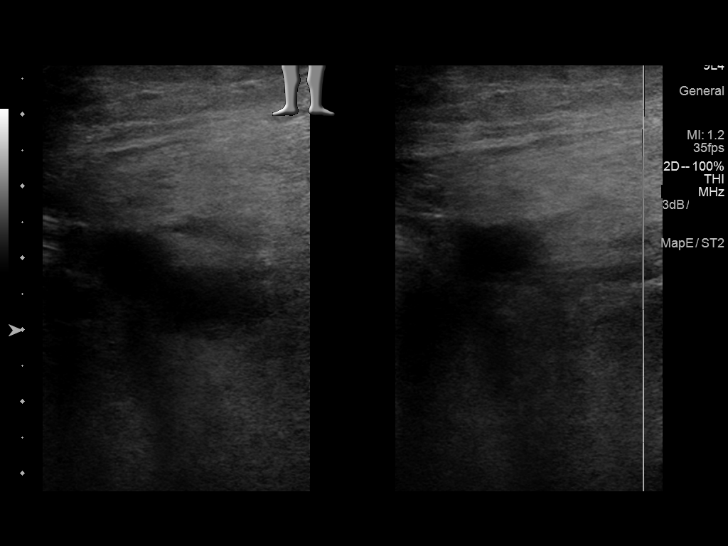
[im 13/49]
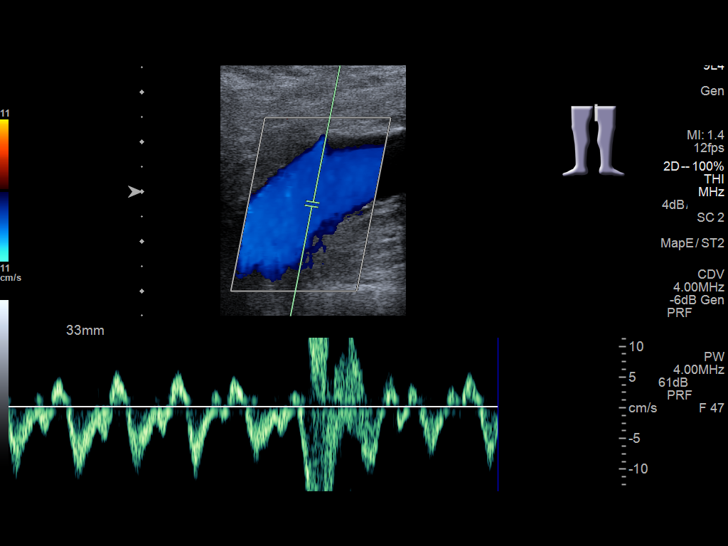
[im 15/49]
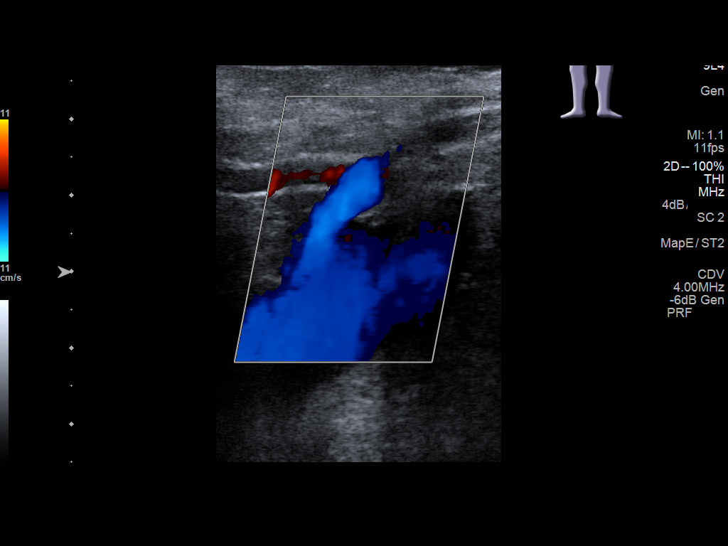
[im 19/49]
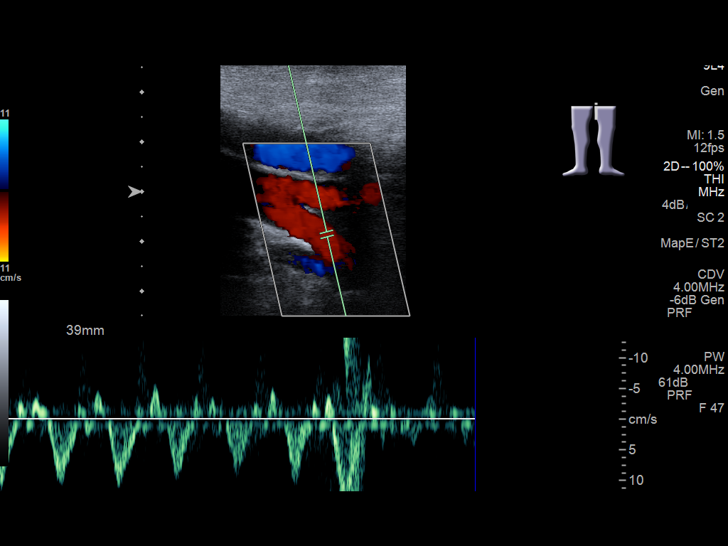
[im 23/49]
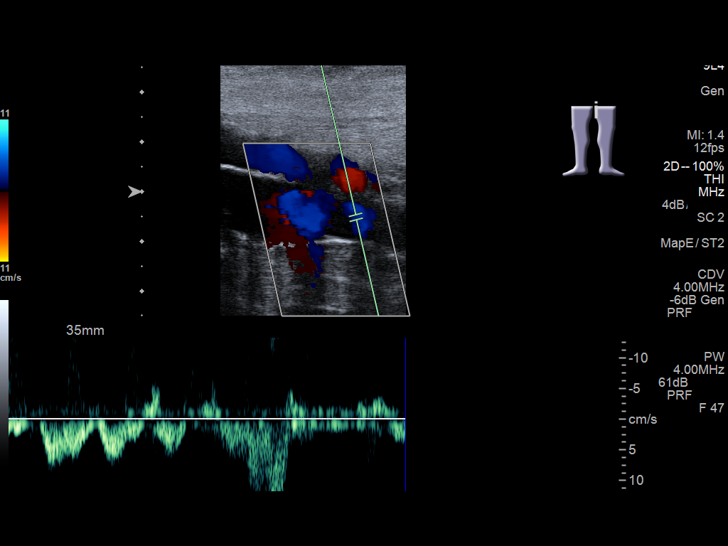
[im 26/49]
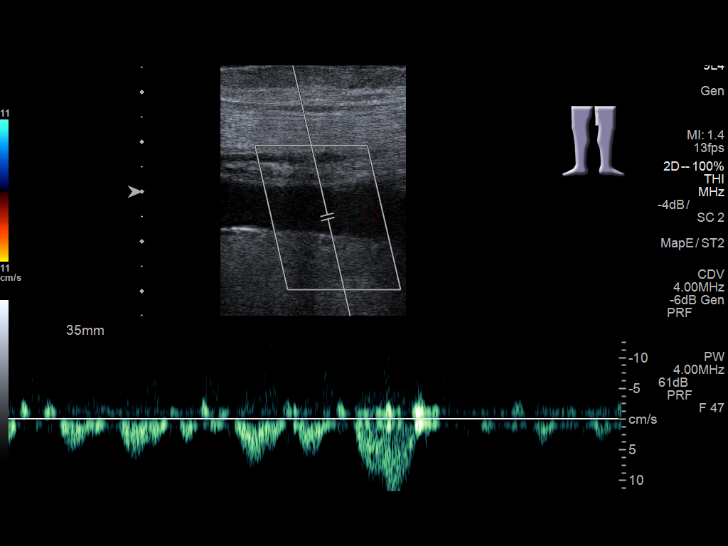
[im 30/49]
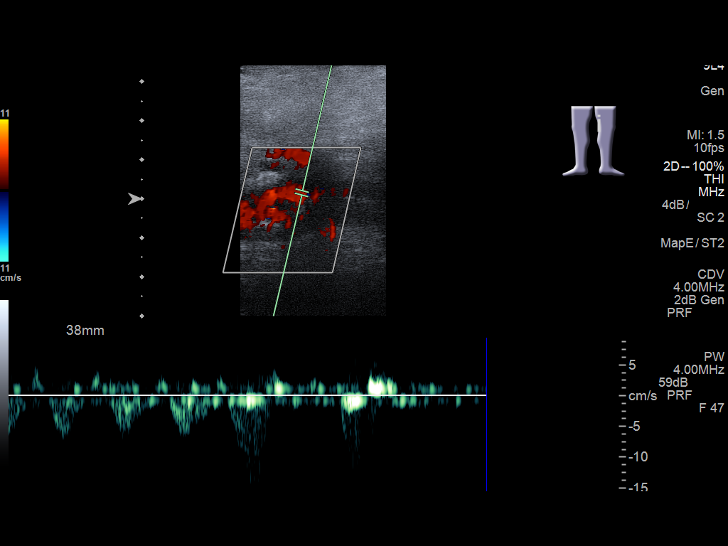
[im 34/49]
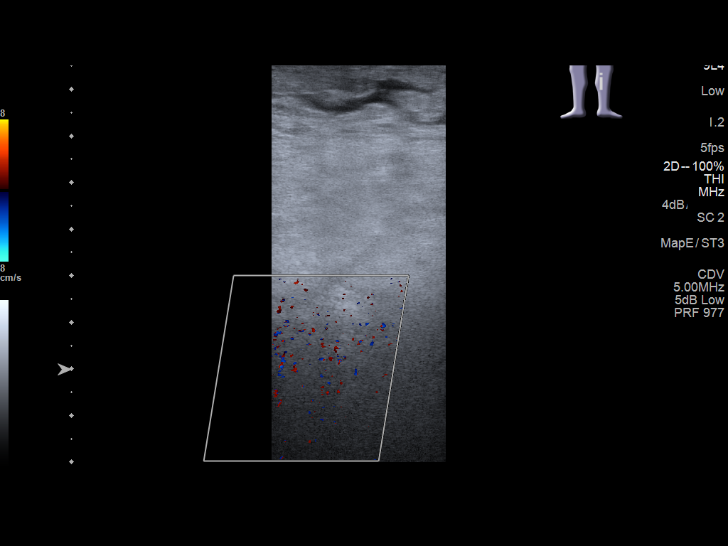
[im 38/49]
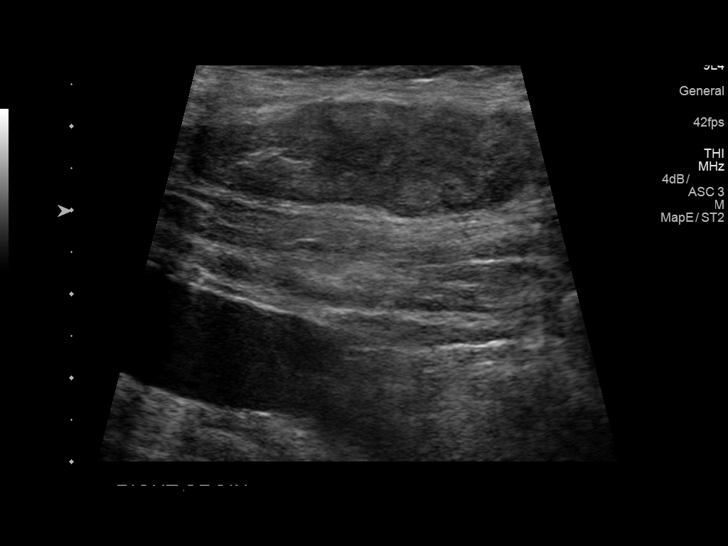
[im 40/49]
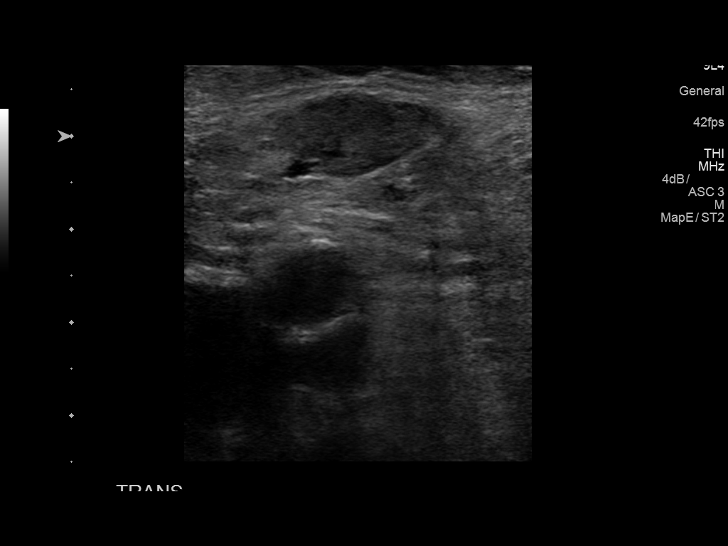
[im 44/49]
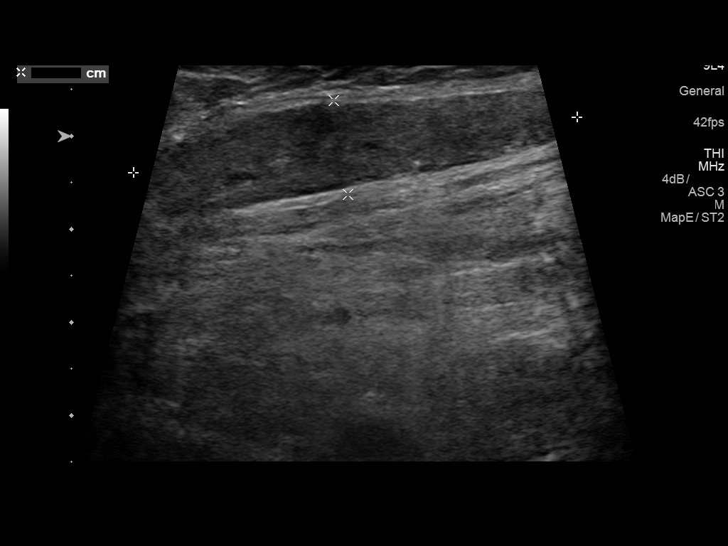
[im 49/49]
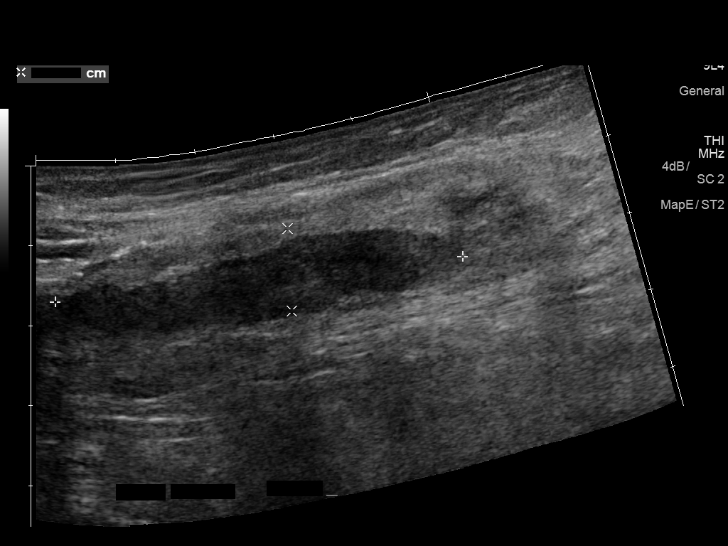

[14 of 24 positions shown; findings below may reference images not displayed]

FINDINGS: Right common femoral vein is patent without thrombus.

Normal compressibility, augmentation and color Doppler flow in the
left common femoral vein, left femoral vein and left popliteal vein.
The left saphenofemoral junction is patent. Left profunda femoral
vein is patent without thrombus. Limited evaluation of the left calf
veins due to soft tissue swelling. There is subcutaneous edema in
the left leg. Prominent inguinal lymph nodes bilaterally.
IMPRESSION: Negative for deep venous thrombosis in left lower extremity.

Prominent bilateral inguinal lymph nodes.
